# Patient Record
Sex: Female | Born: 1960 | Race: Black or African American | Hispanic: No | Marital: Single | Smoking: Current every day smoker
Health system: Southern US, Community
[De-identification: ages and names within clinical notes are randomized; demographics above are authoritative.]

## PROBLEM LIST (undated history)

## (undated) DIAGNOSIS — K859 Acute pancreatitis without necrosis or infection, unspecified: Secondary | ICD-10-CM

## (undated) DIAGNOSIS — K920 Hematemesis: Secondary | ICD-10-CM

## (undated) DIAGNOSIS — N920 Excessive and frequent menstruation with regular cycle: Secondary | ICD-10-CM

## (undated) DIAGNOSIS — D518 Other vitamin B12 deficiency anemias: Secondary | ICD-10-CM

## (undated) DIAGNOSIS — G40309 Generalized idiopathic epilepsy and epileptic syndromes, not intractable, without status epilepticus: Secondary | ICD-10-CM

## (undated) DIAGNOSIS — R946 Abnormal results of thyroid function studies: Secondary | ICD-10-CM

## (undated) DIAGNOSIS — N959 Unspecified menopausal and perimenopausal disorder: Secondary | ICD-10-CM

## (undated) DIAGNOSIS — F17200 Nicotine dependence, unspecified, uncomplicated: Secondary | ICD-10-CM

## (undated) DIAGNOSIS — R55 Syncope and collapse: Secondary | ICD-10-CM

## (undated) DIAGNOSIS — M79609 Pain in unspecified limb: Secondary | ICD-10-CM

## (undated) DIAGNOSIS — R002 Palpitations: Secondary | ICD-10-CM

## (undated) DIAGNOSIS — R634 Abnormal weight loss: Secondary | ICD-10-CM

## (undated) DIAGNOSIS — I10 Essential (primary) hypertension: Secondary | ICD-10-CM

## (undated) DIAGNOSIS — R21 Rash and other nonspecific skin eruption: Secondary | ICD-10-CM

## (undated) HISTORY — DX: Abnormal results of thyroid function studies: R94.6

## (undated) HISTORY — DX: Abnormal weight loss: R63.4

## (undated) HISTORY — DX: Excessive and frequent menstruation with regular cycle: N92.0

## (undated) HISTORY — DX: Other vitamin B12 deficiency anemias: D51.8

## (undated) HISTORY — DX: Palpitations: R00.2

## (undated) HISTORY — DX: Generalized idiopathic epilepsy and epileptic syndromes, not intractable, without status epilepticus: G40.309

## (undated) HISTORY — DX: Rash and other nonspecific skin eruption: R21

## (undated) HISTORY — DX: Syncope and collapse: R55

## (undated) HISTORY — DX: Nicotine dependence, unspecified, uncomplicated: F17.200

## (undated) HISTORY — DX: Essential (primary) hypertension: I10

## (undated) HISTORY — DX: Acute pancreatitis without necrosis or infection, unspecified: K85.90

## (undated) HISTORY — DX: Pain in unspecified limb: M79.609

## (undated) HISTORY — DX: Unspecified menopausal and perimenopausal disorder: N95.9

---

## 1996-12-01 HISTORY — PX: KNEE SURGERY: SHX244

## 2002-12-01 LAB — CONVERTED CEMR LAB: Pap Smear: NORMAL

## 2004-04-26 ENCOUNTER — Encounter: Admission: RE | Admit: 2004-04-26 | Discharge: 2004-04-26 | Payer: Self-pay | Admitting: *Deleted

## 2004-05-23 ENCOUNTER — Ambulatory Visit (HOSPITAL_COMMUNITY): Admission: RE | Admit: 2004-05-23 | Discharge: 2004-05-23 | Payer: Self-pay | Admitting: Family Medicine

## 2004-06-12 ENCOUNTER — Other Ambulatory Visit: Admission: RE | Admit: 2004-06-12 | Discharge: 2004-06-12 | Payer: Self-pay | Admitting: Family Medicine

## 2004-06-12 ENCOUNTER — Encounter: Payer: Self-pay | Admitting: Internal Medicine

## 2004-07-25 ENCOUNTER — Encounter: Payer: Self-pay | Admitting: Internal Medicine

## 2004-07-31 ENCOUNTER — Encounter: Payer: Self-pay | Admitting: Internal Medicine

## 2004-10-29 ENCOUNTER — Ambulatory Visit: Payer: Self-pay | Admitting: Internal Medicine

## 2004-12-31 ENCOUNTER — Ambulatory Visit (HOSPITAL_COMMUNITY): Admission: RE | Admit: 2004-12-31 | Discharge: 2004-12-31 | Payer: Self-pay | Admitting: Internal Medicine

## 2005-01-28 ENCOUNTER — Ambulatory Visit: Payer: Self-pay | Admitting: Internal Medicine

## 2005-02-20 ENCOUNTER — Ambulatory Visit: Payer: Self-pay | Admitting: Internal Medicine

## 2005-03-25 ENCOUNTER — Ambulatory Visit: Payer: Self-pay | Admitting: Internal Medicine

## 2005-03-27 ENCOUNTER — Encounter: Admission: RE | Admit: 2005-03-27 | Discharge: 2005-03-27 | Payer: Self-pay | Admitting: Internal Medicine

## 2006-01-12 ENCOUNTER — Ambulatory Visit: Payer: Self-pay | Admitting: Internal Medicine

## 2006-03-13 ENCOUNTER — Ambulatory Visit: Payer: Self-pay | Admitting: Internal Medicine

## 2007-05-28 ENCOUNTER — Ambulatory Visit: Payer: Self-pay | Admitting: Internal Medicine

## 2007-07-22 ENCOUNTER — Ambulatory Visit: Payer: Self-pay | Admitting: Internal Medicine

## 2007-07-23 ENCOUNTER — Encounter: Payer: Self-pay | Admitting: Internal Medicine

## 2007-08-18 ENCOUNTER — Telehealth: Payer: Self-pay | Admitting: Internal Medicine

## 2008-01-25 ENCOUNTER — Ambulatory Visit: Payer: Self-pay | Admitting: Internal Medicine

## 2008-01-25 DIAGNOSIS — F17200 Nicotine dependence, unspecified, uncomplicated: Secondary | ICD-10-CM

## 2008-01-25 HISTORY — DX: Nicotine dependence, unspecified, uncomplicated: F17.200

## 2008-01-29 DIAGNOSIS — I10 Essential (primary) hypertension: Secondary | ICD-10-CM

## 2008-01-29 HISTORY — DX: Essential (primary) hypertension: I10

## 2008-02-12 ENCOUNTER — Inpatient Hospital Stay (HOSPITAL_COMMUNITY): Admission: EM | Admit: 2008-02-12 | Discharge: 2008-02-13 | Payer: Self-pay | Admitting: Emergency Medicine

## 2008-02-12 ENCOUNTER — Ambulatory Visit: Payer: Self-pay | Admitting: Internal Medicine

## 2008-02-14 ENCOUNTER — Ambulatory Visit: Payer: Self-pay | Admitting: Internal Medicine

## 2008-02-14 DIAGNOSIS — G40309 Generalized idiopathic epilepsy and epileptic syndromes, not intractable, without status epilepticus: Secondary | ICD-10-CM | POA: Insufficient documentation

## 2008-02-14 DIAGNOSIS — D649 Anemia, unspecified: Secondary | ICD-10-CM

## 2008-02-14 HISTORY — DX: Generalized idiopathic epilepsy and epileptic syndromes, not intractable, without status epilepticus: G40.309

## 2008-02-14 LAB — CONVERTED CEMR LAB
Blood in Urine, dipstick: NEGATIVE
Glucose, Urine, Semiquant: NEGATIVE
Nitrite: NEGATIVE
Protein, U semiquant: NEGATIVE
Specific Gravity, Urine: 1.015
Urobilinogen, UA: 0.2
pH: 5.5

## 2008-02-15 ENCOUNTER — Encounter: Payer: Self-pay | Admitting: Internal Medicine

## 2008-02-15 ENCOUNTER — Ambulatory Visit (HOSPITAL_COMMUNITY): Admission: RE | Admit: 2008-02-15 | Discharge: 2008-02-15 | Payer: Self-pay | Admitting: Internal Medicine

## 2008-02-17 ENCOUNTER — Telehealth: Payer: Self-pay | Admitting: Internal Medicine

## 2008-02-18 LAB — CONVERTED CEMR LAB
Basophils Absolute: 0 10*3/uL (ref 0.0–0.1)
Basophils Relative: 0.2 % (ref 0.0–1.0)
CO2: 26 meq/L (ref 19–32)
Creatinine, Ser: 0.6 mg/dL (ref 0.4–1.2)
Folate: 9.1 ng/mL
GFR calc Af Amer: 140 mL/min
GFR calc non Af Amer: 115 mL/min
Glucose, Bld: 100 mg/dL — ABNORMAL HIGH (ref 70–99)
Hemoglobin: 11.2 g/dL — ABNORMAL LOW (ref 12.0–15.0)
MCV: 119.8 fL — ABNORMAL HIGH (ref 78.0–100.0)
Neutro Abs: 4.3 10*3/uL (ref 1.4–7.7)
Neutrophils Relative %: 72.4 % (ref 43.0–77.0)
RBC: 2.78 M/uL — ABNORMAL LOW (ref 3.87–5.11)
RDW: 16.9 % — ABNORMAL HIGH (ref 11.5–14.6)
Vitamin B-12: 241 pg/mL (ref 211–911)
WBC: 6 10*3/uL (ref 4.5–10.5)

## 2008-03-03 ENCOUNTER — Ambulatory Visit: Payer: Self-pay | Admitting: Internal Medicine

## 2008-03-08 ENCOUNTER — Encounter: Payer: Self-pay | Admitting: Internal Medicine

## 2008-03-16 ENCOUNTER — Telehealth: Payer: Self-pay | Admitting: *Deleted

## 2008-03-24 ENCOUNTER — Encounter: Payer: Self-pay | Admitting: Internal Medicine

## 2008-04-05 ENCOUNTER — Ambulatory Visit (HOSPITAL_COMMUNITY): Admission: RE | Admit: 2008-04-05 | Discharge: 2008-04-05 | Payer: Self-pay | Admitting: Neurology

## 2008-06-06 ENCOUNTER — Encounter: Payer: Self-pay | Admitting: Internal Medicine

## 2008-06-22 ENCOUNTER — Ambulatory Visit: Payer: Self-pay | Admitting: Internal Medicine

## 2008-06-22 DIAGNOSIS — M79609 Pain in unspecified limb: Secondary | ICD-10-CM

## 2008-06-22 HISTORY — DX: Pain in unspecified limb: M79.609

## 2008-07-03 ENCOUNTER — Telehealth: Payer: Self-pay | Admitting: Internal Medicine

## 2008-07-19 ENCOUNTER — Encounter: Payer: Self-pay | Admitting: Internal Medicine

## 2008-08-22 ENCOUNTER — Ambulatory Visit: Payer: Self-pay | Admitting: Internal Medicine

## 2008-08-22 ENCOUNTER — Telehealth: Payer: Self-pay | Admitting: Internal Medicine

## 2008-11-20 ENCOUNTER — Ambulatory Visit: Payer: Self-pay | Admitting: Internal Medicine

## 2008-11-20 ENCOUNTER — Encounter (INDEPENDENT_AMBULATORY_CARE_PROVIDER_SITE_OTHER): Payer: Self-pay | Admitting: *Deleted

## 2009-01-02 ENCOUNTER — Ambulatory Visit: Payer: Self-pay | Admitting: Internal Medicine

## 2009-01-02 DIAGNOSIS — R002 Palpitations: Secondary | ICD-10-CM

## 2009-01-02 DIAGNOSIS — R55 Syncope and collapse: Secondary | ICD-10-CM

## 2009-01-02 HISTORY — DX: Palpitations: R00.2

## 2009-01-02 HISTORY — DX: Syncope and collapse: R55

## 2009-01-11 ENCOUNTER — Ambulatory Visit: Payer: Self-pay

## 2009-01-11 ENCOUNTER — Encounter: Payer: Self-pay | Admitting: Internal Medicine

## 2009-01-11 ENCOUNTER — Ambulatory Visit: Payer: Self-pay | Admitting: Internal Medicine

## 2009-01-29 ENCOUNTER — Telehealth: Payer: Self-pay | Admitting: Internal Medicine

## 2009-02-07 ENCOUNTER — Ambulatory Visit: Payer: Self-pay | Admitting: Internal Medicine

## 2009-02-12 ENCOUNTER — Ambulatory Visit: Payer: Self-pay | Admitting: Internal Medicine

## 2009-02-12 DIAGNOSIS — J019 Acute sinusitis, unspecified: Secondary | ICD-10-CM

## 2009-02-13 ENCOUNTER — Telehealth: Payer: Self-pay | Admitting: Internal Medicine

## 2009-02-14 ENCOUNTER — Telehealth: Payer: Self-pay | Admitting: Internal Medicine

## 2009-03-14 ENCOUNTER — Ambulatory Visit: Payer: Self-pay | Admitting: Internal Medicine

## 2009-03-14 LAB — CONVERTED CEMR LAB
ALT: 16 units/L (ref 0–35)
Albumin: 3.7 g/dL (ref 3.5–5.2)
Bilirubin Urine: NEGATIVE
Bilirubin, Direct: 0.1 mg/dL (ref 0.0–0.3)
CO2: 30 meq/L (ref 19–32)
Calcium: 8.9 mg/dL (ref 8.4–10.5)
Chloride: 107 meq/L (ref 96–112)
Cholesterol: 154 mg/dL (ref 0–200)
Creatinine, Ser: 0.6 mg/dL (ref 0.4–1.2)
GFR calc non Af Amer: 138.52 mL/min (ref >60)
Glucose, Bld: 73 mg/dL (ref 70–99)
HCT: 28.2 % — ABNORMAL LOW (ref 36.0–46.0)
HDL: 117.8 mg/dL (ref >39.00)
Hemoglobin: 9.6 g/dL — ABNORMAL LOW (ref 12.0–15.0)
Ketones, urine, test strip: NEGATIVE
LDL Cholesterol: 10 mg/dL (ref 0–99)
Lymphocytes Relative: 28.5 % (ref 12.0–46.0)
Lymphs Abs: 1.3 10*3/uL (ref 0.7–4.0)
Potassium: 3.8 meq/L (ref 3.5–5.1)
RDW: 18.3 % — ABNORMAL HIGH (ref 11.5–14.6)
TSH: 0.33 microintl units/mL — ABNORMAL LOW (ref 0.35–5.50)
Total Bilirubin: 0.6 mg/dL (ref 0.3–1.2)
Total Protein: 6.8 g/dL (ref 6.0–8.3)
WBC Urine, dipstick: NEGATIVE
WBC: 4.6 10*3/uL (ref 4.5–10.5)

## 2009-03-19 ENCOUNTER — Encounter: Payer: Self-pay | Admitting: Internal Medicine

## 2009-03-19 ENCOUNTER — Ambulatory Visit: Payer: Self-pay | Admitting: Internal Medicine

## 2009-03-19 ENCOUNTER — Other Ambulatory Visit: Admission: RE | Admit: 2009-03-19 | Discharge: 2009-03-19 | Payer: Self-pay | Admitting: Internal Medicine

## 2009-03-19 DIAGNOSIS — R946 Abnormal results of thyroid function studies: Secondary | ICD-10-CM

## 2009-03-19 HISTORY — DX: Abnormal results of thyroid function studies: R94.6

## 2009-03-19 LAB — CONVERTED CEMR LAB: Retic Ct Pct: 2.7 % (ref 0.4–3.1)

## 2009-03-27 ENCOUNTER — Telehealth: Payer: Self-pay | Admitting: Internal Medicine

## 2009-03-27 LAB — CONVERTED CEMR LAB
Eosinophils Absolute: 0 10*3/uL (ref 0.0–0.7)
Ferritin: 306.1 ng/mL — ABNORMAL HIGH (ref 10.0–291.0)
Free T4: 0.7 ng/dL (ref 0.6–1.6)
Hemoglobin: 10.1 g/dL — ABNORMAL LOW (ref 12.0–15.0)
Iron: 230 ug/dL — ABNORMAL HIGH (ref 42–145)
Lymphs Abs: 1.4 10*3/uL (ref 0.7–4.0)
MCHC: 34.6 g/dL (ref 30.0–36.0)
Monocytes Absolute: 0.3 10*3/uL (ref 0.1–1.0)
Neutro Abs: 3 10*3/uL (ref 1.4–7.7)
Platelets: 204 10*3/uL (ref 150.0–400.0)
RBC: 2.57 M/uL — ABNORMAL LOW (ref 3.87–5.11)
RDW: 17.7 % — ABNORMAL HIGH (ref 11.5–14.6)
Transferrin: 314.4 mg/dL (ref 212.0–360.0)
Vitamin B-12: 146 pg/mL — ABNORMAL LOW (ref 211–911)
WBC: 4.7 10*3/uL (ref 4.5–10.5)

## 2009-04-05 ENCOUNTER — Ambulatory Visit: Payer: Self-pay | Admitting: Internal Medicine

## 2009-04-05 DIAGNOSIS — D518 Other vitamin B12 deficiency anemias: Secondary | ICD-10-CM | POA: Insufficient documentation

## 2009-04-05 HISTORY — DX: Other vitamin B12 deficiency anemias: D51.8

## 2009-04-12 ENCOUNTER — Ambulatory Visit: Payer: Self-pay | Admitting: Internal Medicine

## 2009-04-19 ENCOUNTER — Ambulatory Visit: Payer: Self-pay | Admitting: Internal Medicine

## 2009-04-26 ENCOUNTER — Ambulatory Visit: Payer: Self-pay | Admitting: Internal Medicine

## 2009-05-03 ENCOUNTER — Ambulatory Visit: Payer: Self-pay | Admitting: Internal Medicine

## 2009-05-10 ENCOUNTER — Ambulatory Visit: Payer: Self-pay | Admitting: Internal Medicine

## 2009-05-17 ENCOUNTER — Ambulatory Visit: Payer: Self-pay | Admitting: Internal Medicine

## 2009-05-25 LAB — CONVERTED CEMR LAB
Basophils Absolute: 0 10*3/uL (ref 0.0–0.1)
Eosinophils Absolute: 0 10*3/uL (ref 0.0–0.7)
Eosinophils Relative: 0.8 % (ref 0.0–5.0)
Folate: 3.7 ng/mL
Lymphs Abs: 1.3 10*3/uL (ref 0.7–4.0)
MCHC: 34.3 g/dL (ref 30.0–36.0)
Monocytes Absolute: 0.3 10*3/uL (ref 0.1–1.0)
Neutro Abs: 3.5 10*3/uL (ref 1.4–7.7)
Neutrophils Relative %: 67.6 % (ref 43.0–77.0)
RDW: 16.2 % — ABNORMAL HIGH (ref 11.5–14.6)

## 2009-05-29 ENCOUNTER — Ambulatory Visit: Payer: Self-pay | Admitting: Internal Medicine

## 2009-06-28 ENCOUNTER — Ambulatory Visit: Payer: Self-pay | Admitting: Internal Medicine

## 2009-07-30 ENCOUNTER — Ambulatory Visit: Payer: Self-pay | Admitting: Internal Medicine

## 2009-08-16 ENCOUNTER — Ambulatory Visit: Payer: Self-pay | Admitting: Internal Medicine

## 2009-08-16 LAB — CONVERTED CEMR LAB
Basophils Absolute: 0 10*3/uL (ref 0.0–0.1)
Eosinophils Absolute: 0 10*3/uL (ref 0.0–0.7)
Hemoglobin: 11 g/dL — ABNORMAL LOW (ref 12.0–15.0)
Lymphocytes Relative: 35.8 % (ref 12.0–46.0)
MCV: 116.6 fL — ABNORMAL HIGH (ref 78.0–100.0)
Monocytes Absolute: 0.4 10*3/uL (ref 0.1–1.0)
Platelets: 145 10*3/uL — ABNORMAL LOW (ref 150.0–400.0)
Retic Ct Pct: 2 % (ref 0.4–3.1)
WBC: 3.8 10*3/uL — ABNORMAL LOW (ref 4.5–10.5)

## 2009-08-23 ENCOUNTER — Ambulatory Visit: Payer: Self-pay | Admitting: Internal Medicine

## 2009-08-23 DIAGNOSIS — D539 Nutritional anemia, unspecified: Secondary | ICD-10-CM

## 2009-08-23 DIAGNOSIS — N959 Unspecified menopausal and perimenopausal disorder: Secondary | ICD-10-CM | POA: Insufficient documentation

## 2009-08-23 HISTORY — DX: Unspecified menopausal and perimenopausal disorder: N95.9

## 2009-08-28 ENCOUNTER — Ambulatory Visit: Payer: Self-pay | Admitting: Internal Medicine

## 2009-09-19 ENCOUNTER — Encounter: Payer: Self-pay | Admitting: Internal Medicine

## 2009-09-19 ENCOUNTER — Encounter: Payer: Self-pay | Admitting: *Deleted

## 2009-09-19 LAB — CBC & DIFF AND RETIC
BASO%: 0.5 % (ref 0.0–2.0)
EOS%: 1.5 % (ref 0.0–7.0)
Immature Retic Fract: 17.6 % — ABNORMAL HIGH (ref 0.00–10.70)
MCH: 37.5 pg — ABNORMAL HIGH (ref 25.1–34.0)
MCHC: 34.3 g/dL (ref 31.5–36.0)
MONO#: 0.3 10*3/uL (ref 0.1–0.9)
RBC: 3.09 10*6/uL — ABNORMAL LOW (ref 3.70–5.45)
Retic %: 1.84 % — ABNORMAL HIGH (ref 0.50–1.50)
WBC: 4.1 10*3/uL (ref 3.9–10.3)
lymph#: 1.3 10*3/uL (ref 0.9–3.3)

## 2009-09-19 LAB — CONVERTED CEMR LAB: Potassium: 3 meq/L

## 2009-09-21 LAB — LACTATE DEHYDROGENASE: LDH: 167 U/L (ref 94–250)

## 2009-09-21 LAB — VITAMIN B12: Vitamin B-12: 708 pg/mL (ref 211–911)

## 2009-09-21 LAB — COMPREHENSIVE METABOLIC PANEL
ALT: 11 U/L (ref 0–35)
Alkaline Phosphatase: 71 U/L (ref 39–117)
CO2: 27 mEq/L (ref 19–32)
Creatinine, Ser: 0.65 mg/dL (ref 0.40–1.20)
Glucose, Bld: 92 mg/dL (ref 70–99)
Sodium: 141 mEq/L (ref 135–145)
Total Bilirubin: 0.4 mg/dL (ref 0.3–1.2)
Total Protein: 7.6 g/dL (ref 6.0–8.3)

## 2009-09-21 LAB — PROTEIN ELECTROPHORESIS, SERUM
Alpha-1-Globulin: 4.9 % (ref 2.9–4.9)
Alpha-2-Globulin: 12.3 % — ABNORMAL HIGH (ref 7.1–11.8)
Beta 2: 6 % (ref 3.2–6.5)
Gamma Globulin: 14.6 % (ref 11.1–18.8)

## 2009-09-21 LAB — FERRITIN: Ferritin: 523 ng/mL — ABNORMAL HIGH (ref 10–291)

## 2009-09-21 LAB — IRON AND TIBC
%SAT: 35 % (ref 20–55)
Iron: 130 ug/dL (ref 42–145)
TIBC: 375 ug/dL (ref 250–470)

## 2009-10-04 ENCOUNTER — Telehealth: Payer: Self-pay | Admitting: *Deleted

## 2009-10-04 ENCOUNTER — Encounter: Payer: Self-pay | Admitting: *Deleted

## 2009-10-11 ENCOUNTER — Ambulatory Visit: Payer: Self-pay | Admitting: Internal Medicine

## 2009-10-15 ENCOUNTER — Encounter: Payer: Self-pay | Admitting: Internal Medicine

## 2009-10-15 LAB — CBC WITH DIFFERENTIAL/PLATELET
BASO%: 0.9 % (ref 0.0–2.0)
EOS%: 1 % (ref 0.0–7.0)
LYMPH%: 29.5 % (ref 14.0–49.7)
MCH: 38.8 pg — ABNORMAL HIGH (ref 25.1–34.0)
MCHC: 33.1 g/dL (ref 31.5–36.0)
MCV: 117.2 fL — ABNORMAL HIGH (ref 79.5–101.0)
MONO#: 0.4 10*3/uL (ref 0.1–0.9)
MONO%: 7.6 % (ref 0.0–14.0)
NEUT%: 61 % (ref 38.4–76.8)
Platelets: 199 10*3/uL (ref 145–400)
RBC: 2.86 10*6/uL — ABNORMAL LOW (ref 3.70–5.45)
WBC: 4.7 10*3/uL (ref 3.9–10.3)

## 2010-03-18 ENCOUNTER — Ambulatory Visit: Payer: Self-pay | Admitting: Internal Medicine

## 2010-03-18 DIAGNOSIS — N920 Excessive and frequent menstruation with regular cycle: Secondary | ICD-10-CM

## 2010-03-18 HISTORY — DX: Excessive and frequent menstruation with regular cycle: N92.0

## 2010-03-27 LAB — CONVERTED CEMR LAB
BUN: 5 mg/dL — ABNORMAL LOW (ref 6–23)
Basophils Absolute: 0 10*3/uL (ref 0.0–0.1)
CO2: 29 meq/L (ref 19–32)
Chloride: 99 meq/L (ref 96–112)
Creatinine, Ser: 0.7 mg/dL (ref 0.4–1.2)
Eosinophils Relative: 0.6 % (ref 0.0–5.0)
GFR calc non Af Amer: 115.43 mL/min (ref >60)
HCT: 31.1 % — ABNORMAL LOW (ref 36.0–46.0)
Hemoglobin: 10.7 g/dL — ABNORMAL LOW (ref 12.0–15.0)
Lymphs Abs: 1 10*3/uL (ref 0.7–4.0)
Neutro Abs: 3.6 10*3/uL (ref 1.4–7.7)
Potassium: 3.6 meq/L (ref 3.5–5.1)

## 2010-04-17 ENCOUNTER — Other Ambulatory Visit: Admission: RE | Admit: 2010-04-17 | Discharge: 2010-04-17 | Payer: Self-pay | Admitting: Obstetrics and Gynecology

## 2010-04-17 ENCOUNTER — Ambulatory Visit: Payer: Self-pay | Admitting: Obstetrics and Gynecology

## 2010-04-24 ENCOUNTER — Ambulatory Visit (HOSPITAL_COMMUNITY)
Admission: RE | Admit: 2010-04-24 | Discharge: 2010-04-24 | Payer: Self-pay | Source: Home / Self Care | Admitting: Obstetrics and Gynecology

## 2010-05-03 ENCOUNTER — Ambulatory Visit: Payer: Self-pay | Admitting: Obstetrics & Gynecology

## 2010-05-03 ENCOUNTER — Encounter (INDEPENDENT_AMBULATORY_CARE_PROVIDER_SITE_OTHER): Payer: Self-pay | Admitting: Family Medicine

## 2010-05-03 LAB — CONVERTED CEMR LAB: FSH: 54.3 milliintl units/mL

## 2010-07-05 ENCOUNTER — Ambulatory Visit: Payer: Self-pay | Admitting: Internal Medicine

## 2010-08-06 ENCOUNTER — Ambulatory Visit: Payer: Self-pay | Admitting: Internal Medicine

## 2010-09-23 ENCOUNTER — Ambulatory Visit: Payer: Self-pay | Admitting: Internal Medicine

## 2010-09-23 DIAGNOSIS — R61 Generalized hyperhidrosis: Secondary | ICD-10-CM | POA: Insufficient documentation

## 2010-12-03 ENCOUNTER — Ambulatory Visit (HOSPITAL_COMMUNITY)
Admission: RE | Admit: 2010-12-03 | Discharge: 2010-12-03 | Payer: Self-pay | Source: Home / Self Care | Attending: Internal Medicine | Admitting: Internal Medicine

## 2010-12-03 ENCOUNTER — Ambulatory Visit
Admission: RE | Admit: 2010-12-03 | Discharge: 2010-12-03 | Payer: Self-pay | Source: Home / Self Care | Attending: Internal Medicine | Admitting: Internal Medicine

## 2010-12-03 ENCOUNTER — Other Ambulatory Visit: Payer: Self-pay | Admitting: Internal Medicine

## 2010-12-03 DIAGNOSIS — R109 Unspecified abdominal pain: Secondary | ICD-10-CM | POA: Insufficient documentation

## 2010-12-03 LAB — BASIC METABOLIC PANEL
BUN: 10 mg/dL (ref 6–23)
CO2: 25 mEq/L (ref 19–32)
Calcium: 10.3 mg/dL (ref 8.4–10.5)
Chloride: 92 mEq/L — ABNORMAL LOW (ref 96–112)
Creatinine, Ser: 0.7 mg/dL (ref 0.4–1.2)
GFR: 118.99 mL/min (ref >60.00)
Glucose, Bld: 81 mg/dL (ref 70–99)
Potassium: 3.7 mEq/L (ref 3.5–5.1)
Sodium: 134 mEq/L — ABNORMAL LOW (ref 135–145)

## 2010-12-03 LAB — CBC WITH DIFFERENTIAL/PLATELET
Basophils Absolute: 0 10*3/uL (ref 0.0–0.1)
Basophils Relative: 0.3 % (ref 0.0–3.0)
Eosinophils Absolute: 0.1 10*3/uL (ref 0.0–0.7)
Eosinophils Relative: 0.8 % (ref 0.0–5.0)
HCT: 39.9 % (ref 36.0–46.0)
Hemoglobin: 13.3 g/dL (ref 12.0–15.0)
Lymphocytes Relative: 13.8 % (ref 12.0–46.0)
Lymphs Abs: 0.9 10*3/uL (ref 0.7–4.0)
MCHC: 33.4 g/dL (ref 30.0–36.0)
MCV: 112.3 fl — ABNORMAL HIGH (ref 78.0–100.0)
Monocytes Absolute: 0.5 10*3/uL (ref 0.1–1.0)
Monocytes Relative: 8.1 % (ref 3.0–12.0)
Neutro Abs: 4.9 10*3/uL (ref 1.4–7.7)
Neutrophils Relative %: 77 % (ref 43.0–77.0)
Platelets: 279 10*3/uL (ref 150.0–400.0)
RBC: 3.56 Mil/uL — ABNORMAL LOW (ref 3.87–5.11)
RDW: 16.8 % — ABNORMAL HIGH (ref 11.5–14.6)
WBC: 6.3 10*3/uL (ref 4.5–10.5)

## 2010-12-03 LAB — HEPATIC FUNCTION PANEL
ALT: 12 U/L (ref 0–35)
AST: 31 U/L (ref 0–37)
Albumin: 3.8 g/dL (ref 3.5–5.2)
Alkaline Phosphatase: 110 U/L (ref 39–117)
Bilirubin, Direct: 0.1 mg/dL (ref 0.0–0.3)
Total Bilirubin: 1.2 mg/dL (ref 0.3–1.2)
Total Protein: 8.2 g/dL (ref 6.0–8.3)

## 2010-12-03 LAB — CONVERTED CEMR LAB
Nitrite: NEGATIVE
Urobilinogen, UA: 1

## 2010-12-03 LAB — LIPASE: Lipase: 1227 U/L — ABNORMAL HIGH (ref 11.0–59.0)

## 2010-12-03 LAB — AMYLASE: Amylase: 452 U/L — ABNORMAL HIGH (ref 27–131)

## 2010-12-04 ENCOUNTER — Telehealth: Payer: Self-pay | Admitting: Internal Medicine

## 2010-12-04 ENCOUNTER — Telehealth (INDEPENDENT_AMBULATORY_CARE_PROVIDER_SITE_OTHER): Payer: Self-pay

## 2010-12-05 ENCOUNTER — Ambulatory Visit
Admission: RE | Admit: 2010-12-05 | Discharge: 2010-12-05 | Payer: Self-pay | Source: Home / Self Care | Attending: Gastroenterology | Admitting: Gastroenterology

## 2010-12-05 ENCOUNTER — Other Ambulatory Visit: Payer: Self-pay | Admitting: Physician Assistant

## 2010-12-05 DIAGNOSIS — R634 Abnormal weight loss: Secondary | ICD-10-CM | POA: Insufficient documentation

## 2010-12-05 DIAGNOSIS — K859 Acute pancreatitis without necrosis or infection, unspecified: Secondary | ICD-10-CM | POA: Insufficient documentation

## 2010-12-05 HISTORY — DX: Abnormal weight loss: R63.4

## 2010-12-05 LAB — CBC WITH DIFFERENTIAL/PLATELET
Basophils Absolute: 0 10*3/uL (ref 0.0–0.1)
Basophils Relative: 0.4 % (ref 0.0–3.0)
Eosinophils Absolute: 0.1 10*3/uL (ref 0.0–0.7)
Eosinophils Relative: 1.3 % (ref 0.0–5.0)
HCT: 38.9 % (ref 36.0–46.0)
Hemoglobin: 12.8 g/dL (ref 12.0–15.0)
Lymphocytes Relative: 14 % (ref 12.0–46.0)
Lymphs Abs: 0.9 10*3/uL (ref 0.7–4.0)
MCHC: 32.9 g/dL (ref 30.0–36.0)
MCV: 111.5 fl — ABNORMAL HIGH (ref 78.0–100.0)
Monocytes Absolute: 0.5 10*3/uL (ref 0.1–1.0)
Monocytes Relative: 8.3 % (ref 3.0–12.0)
Neutro Abs: 5 10*3/uL (ref 1.4–7.7)
Neutrophils Relative %: 76 % (ref 43.0–77.0)
Platelets: 294 10*3/uL (ref 150.0–400.0)
RBC: 3.49 Mil/uL — ABNORMAL LOW (ref 3.87–5.11)
RDW: 16.3 % — ABNORMAL HIGH (ref 11.5–14.6)
WBC: 6.6 10*3/uL (ref 4.5–10.5)

## 2010-12-05 LAB — BASIC METABOLIC PANEL
BUN: 16 mg/dL (ref 6–23)
CO2: 22 mEq/L (ref 19–32)
Calcium: 10.5 mg/dL (ref 8.4–10.5)
Chloride: 96 mEq/L (ref 96–112)
Creatinine, Ser: 0.8 mg/dL (ref 0.4–1.2)
GFR: 101.57 mL/min (ref >60.00)
Glucose, Bld: 83 mg/dL (ref 70–99)
Potassium: 4.4 mEq/L (ref 3.5–5.1)
Sodium: 137 mEq/L (ref 135–145)

## 2010-12-05 LAB — AMYLASE: Amylase: 309 U/L — ABNORMAL HIGH (ref 27–131)

## 2010-12-05 LAB — LIPASE: Lipase: 718 U/L — ABNORMAL HIGH (ref 11.0–59.0)

## 2010-12-16 ENCOUNTER — Ambulatory Visit
Admission: RE | Admit: 2010-12-16 | Discharge: 2010-12-16 | Payer: Self-pay | Source: Home / Self Care | Attending: Gastroenterology | Admitting: Gastroenterology

## 2010-12-16 ENCOUNTER — Encounter: Payer: Self-pay | Admitting: Gastroenterology

## 2010-12-16 DIAGNOSIS — R933 Abnormal findings on diagnostic imaging of other parts of digestive tract: Secondary | ICD-10-CM | POA: Insufficient documentation

## 2010-12-16 LAB — CONVERTED CEMR LAB: Beta hcg, urine, semiquantitative: NEGATIVE

## 2010-12-22 ENCOUNTER — Encounter: Payer: Self-pay | Admitting: Family Medicine

## 2010-12-23 ENCOUNTER — Other Ambulatory Visit: Payer: Self-pay | Admitting: Internal Medicine

## 2010-12-23 ENCOUNTER — Ambulatory Visit
Admission: RE | Admit: 2010-12-23 | Discharge: 2010-12-23 | Payer: Self-pay | Source: Home / Self Care | Attending: Internal Medicine | Admitting: Internal Medicine

## 2010-12-23 DIAGNOSIS — R21 Rash and other nonspecific skin eruption: Secondary | ICD-10-CM

## 2010-12-23 HISTORY — DX: Rash and other nonspecific skin eruption: R21

## 2010-12-25 ENCOUNTER — Other Ambulatory Visit: Payer: Self-pay | Admitting: Gastroenterology

## 2010-12-25 DIAGNOSIS — K859 Acute pancreatitis without necrosis or infection, unspecified: Secondary | ICD-10-CM

## 2010-12-31 NOTE — Assessment & Plan Note (Signed)
Summary: swollen face//ccm   Vital Signs:  Patient profile:   50 year old female Menstrual status:  perimenopausal Weight:      107 pounds Temp:     98.6 degrees F oral Pulse rate:   72 / minute BP sitting:   110 / 70  (left arm) Cuff size:   regular  Vitals Entered By: Romualdo Bolk, CMA (AAMA) (July 05, 2010 9:09 AM) CC: Face swelling this started on 8/4 with her mouth swollen and cracking. Pt states that the only thing she can't relate this too is eating at [employer] on 8/3. Pt is not having any problems breathing. On 8/3 pt stated having a frontal ha. Some coughing and sinus drainage. Pt states that this happened before and it was a tooth abcess but she doesn't have any problems with teeth hurting.   History of Present Illness: Nichole [name] comes in today  for SDA for above  ... yesterday some painless lip  swelling and now whole face swollen upt to  forehead ... no new meds and no fever dental pain . midl cough no choking or wheezing .   no hx of hives allergic rx like this.  NO uri signs but has small spot sore on tip of tongue.   No changes but did eat a pf changes  but no flushing or redness.  No edema peripherally.   Preventive Screening-Counseling & Management  Alcohol-Tobacco     Alcohol drinks/day: 2     Alcohol type: beers and mix drink on weekends     Smoking Status: current     Smoking Cessation Counseling: yes     Packs/Day: 0.5  Caffeine-Diet-Exercise     Caffeine use/day: 0     Does Patient Exercise: yes     Type of exercise: walking     Exercise (avg: min/session): <30     Times/week: 2  Current Medications (verified): 1)  Metoprolol Tartrate 50 Mg  Tabs (Metoprolol Tartrate) .... 1.5  By Mouth Qam and 1 At Night 2)  Lisinopril-Hydrochlorothiazide 20-25 Mg  Tabs (Lisinopril-Hydrochlorothiazide) .... 1/2 Two Times A Day  Allergies (verified): No Known Drug Allergies  Past History:  Past medical, surgical, family and social histories  (including risk factors) reviewed, and no changes noted (except as noted below).  Past Medical History: Reviewed history from 03/19/2009 and no changes required. Hypertension hosp for seizure ? etho wd 3/14 09   Tachypalpitations  nl echo and Holter PVCs and PACs  Syncope probably neurally mediated     Consults [facility]  Past Surgical History: Reviewed history from 01/25/2008 and no changes required. knee surg 2001  Past History:  Care Management: Neurology: [facility]: Dr. Graciela [facility]  Family History: Reviewed history from 05/29/2009 and no changes required. spinomuscular atrophy  no anemia  Father:  MI   CVA  ,  70 died  Mother:  Kidney removed  for cyst  and overweight.  HT   Siblings:   bro HT    Thyroid negative .  osteoprosis  Aunt  renal transplant.  ? no anemia   Social History: Reviewed history from 03/18/2010 and no changes required. Single Current Smoker no insurance    on [employer] program  stopped all alcohol since original episode more recently  now about 6-7 on the weekend  non during th week  unemployed   Review of Systems  The patient denies anorexia, fever, weight loss, weight gain, vision loss, decreased hearing, dyspnea on exertion,  prolonged cough, abdominal pain, melena, hematochezia, hematuria, difficulty walking, abnormal bleeding, and enlarged lymph nodes.    Physical Exam  General:  alert, well-developed, and well-nourished.   swelling  total face  nasal bridge down  and lips  no distress Head:  normocephalic and atraumatic.   Eyes:  vision grossly intact, pupils equal, and pupils round.   Ears:  R ear normal and L ear normal.  wax right ear  Nose:  no external deformity, no external erythema, and no nasal discharge.   Mouth:  pharynx pink and moist.  no edema and good airway.  tongue nl except tiny  are tip of tongue.  Neck:  No deformities, masses, or tenderness noted. Lungs:  Normal respiratory effort,  chest expands symmetrically. Lungs are clear to auscultation, no crackles or wheezes. Heart:  Normal rate and regular rhythm. S1 and S2 normal without gallop, murmur, click, rub or other extra sounds. Msk:  no acute changes  Pulses:  nl cap arefill  Extremities:  no clubbing cyanosis or edema  Neurologic:  alert & oriented X3 and gait normal.   Skin:  turgor normal and color normal.  edema as above  no redness excep faint red left upper lip Cervical Nodes:  No lymphadenopathy noted Psych:  Oriented X3, normally interactive, good eye contact, not anxious appearing, and not depressed appearing.     Impression & Recommendations:  Problem # 1:  ? of ANGIOEDEMA (ICD-995.1) Assessment New  no obv  dental infection  or sinusitis .    concern about   ace inhibitor as a cause  .     will rx as such .      no airway issues but does have a cough  .   counseled   to seek care if alarm features.  Orders: Prescription Created Electronically [phone])  Problem # 2:  HYPERTENSION (ICD-401.9)  The following medications were removed from the medication list:    Lisinopril-hydrochlorothiazide 20-25 Mg Tabs (Lisinopril-hydrochlorothiazide) .Marland Kitchen... 1/2 two times a day Her updated medication list for this problem includes:    Metoprolol Tartrate 50 Mg Tabs (Metoprolol tartrate) .Marland Kitchen... 1.5  by mouth qam and 1 at night    Benicar Hct 40-25 Mg Tabs (Olmesartan medoxomil-hctz) .Marland Kitchen... Take 1/2 by mouth once daily for high blood pressure  Problem # 3:  TOBACCO USE (ICD-305.1) Assessment: Comment Only  Complete Medication List: 1)  Metoprolol Tartrate 50 Mg Tabs (Metoprolol tartrate) .... 1.5  by mouth qam and 1 at night 2)  Prednisone 20 Mg Tabs (Prednisone) .... Take 3  by mouth once daily for 2 days then 2 by mouth once daily for 3 days  or as directed 3)  Benicar Hct 40-25 Mg Tabs (Olmesartan medoxomil-hctz) .... Take 1/2 by mouth once daily for high blood pressure  Patient Instructions: 1)  incase this is a  side effect of the lisinopril  . we need to stop this medicine. 2)  Take prednisone .  as directed . 3)  for possible allergic reaction.  4)  should improve in the next 24-72  hours .  5)  can also take generic benadry l in the meantime 6)  if getting worse  fever  or respiratory problems call  7)  when  better can start the sample of bp med   and then ROV  in 1 month.  Prescriptions: PREDNISONE 20 MG TABS (PREDNISONE) take 3  by mouth once daily for 2 days then 2 by mouth once daily for  3 days  or as directed  #15 x 0   Entered and Authorized by:   [name]   Signed by:   [name] on 07/05/2010   Method used:   Electronically to        [facility] [phone]* (retail)       [address]       [place], [place]  [address]       Ph: [phone]       Fax: [phone]   RxID:   [id number]

## 2010-12-31 NOTE — Assessment & Plan Note (Signed)
Summary: 1 month fup//ccm   Vital Signs:  Patient profile:   50 year old female Menstrual status:  perimenopausal Weight:      109 pounds Pulse rate:   66 / minute BP sitting:   140 / 88  Serial Vital Signs/Assessments:  Time      Position  BP       Pulse  Resp  Temp     By           R Arm     140/90                         Nichole [name]           L Arm     145/90                         Nichole [name]   History of Present Illness: Nichole [name] comes in today  for follow up of High blood pressure and facial swelling .     ace inhibitor was stopped and was given benicarhctz samples and pred  .   Had gotten better immediatley Since that time  ..she has felt well.   She stopped  the med a few weeks ago .Marland Kitchen She feels better  and no recurrence of swelling that resolved within a day. NO fevers. [facility]  from [place] BP was 155/80 and rx was delayed pending clearance form [name].     Hasnt been checking readings.  N o palpitations   missswed last pms dose of metoprolol   but took todays.   Preventive Screening-Counseling & Management  Alcohol-Tobacco     Alcohol drinks/day: 2     Alcohol type: beers and mix drink on weekends     Smoking Status: current     Smoking Cessation Counseling: yes     Packs/Day: 0.5  Caffeine-Diet-Exercise     Caffeine use/day: 0     Does Patient Exercise: yes     Type of exercise: walking     Exercise (avg: min/session): <30     Times/week: 2  Allergies (verified): 1)  ! * Lisinopril 2)  * Benicar Hctz  Past History:  Past medical, surgical, family and social histories (including risk factors) reviewed for relevance to current acute and chronic problems.  Past Medical History: Hypertension hosp for seizure ? etho wd 3/14 09   Tachypalpitations  nl echo and Holter PVCs and PACs  Syncope probably neurally mediated DUB with submucosal fibroid June 2011    Consults [facility]  Past Surgical  History: Reviewed history from 01/25/2008 and no changes required. knee surg 2001  Past History:  Care Management: Neurology: [facility]: Dr. Graciela [facility]   Family History: Reviewed history from 05/29/2009 and no changes required. spinomuscular atrophy  no anemia  Father:  MI   CVA  ,  9 died  Mother:  Kidney removed  for cyst  and overweight.  HT   Siblings:   bro HT    Thyroid negative .  osteoprosis  Aunt  renal transplant.  ? no anemia   Social History: Reviewed history from 03/18/2010 and no changes required. Single Current Smoker no insurance    on [employer] program  stopped all alcohol since original episode more recently  now about 6-7 on the weekend  non during th week  unemployed   Review  of Systems  The patient denies chest pain, syncope, dyspnea on exertion, prolonged cough, and peripheral edema.    Physical Exam  General:  Well-developed,well-nourished,in no acute distress; alert,appropriate and cooperative throughout examination Head:  normocephalic and atraumatic.   Eyes:  vision grossly intact.   Neck:  No deformities, masses, or tenderness noted. Lungs:  normal respiratory effort, no intercostal retractions, and no accessory muscle use.   Heart:  normal rate and regular rhythm.   Cervical Nodes:  No lymphadenopathy noted Psych:  Oriented X3, normally interactive, good eye contact, not anxious appearing, and not depressed appearing.     Impression & Recommendations:  Problem # 1:  HYPERTENSION (ICD-401.9) my readings are generally up  but nurses are not.   sometimes takes med late .   certainly not to take ace with hx of poss angioedema and seems to get se of diuretics .... cost a factor will try  low dose norvasc .Marland Kitchen.  se explained . The following medications were removed from the medication list:    Benicar Hct 40-25 Mg Tabs (Olmesartan medoxomil-hctz) .Marland Kitchen... Take 1/2 by mouth once daily for high blood pressure Her updated  medication list for this problem includes:    Metoprolol Tartrate 50 Mg Tabs (Metoprolol tartrate) .Marland Kitchen... 1.5  by mouth qam and 1 at night    Amlodipine Besylate 5 Mg Tabs (Amlodipine besylate) .Marland Kitchen... Take 1/2 by mouth once daily andincrease to 1 by mouth once daily or as directed for high blood pressure form signed for dental   cleaning  go ahead   Complete Medication List: 1)  Metoprolol Tartrate 50 Mg Tabs (Metoprolol tartrate) .... 1.5  by mouth qam and 1 at night 2)  Amlodipine Besylate 5 Mg Tabs (Amlodipine besylate) .... Take 1/2 by mouth once daily andincrease to 1 by mouth once daily or as directed for high blood pressure  Patient Instructions: 1)  begin 2.5 mg of amlodipine ( 1/2 of the 5 mg)  2)  return office visit in 6-8 weeks . 3)  if BP over 150 call .  4)  goal is below 140/90 on average . Prescriptions: AMLODIPINE BESYLATE 5 MG TABS (AMLODIPINE BESYLATE) take 1/2 by mouth once daily andincrease to 1 by mouth once daily or as directed for high blood pressure  #30 x 2   Entered and Authorized by:   Nichole [name]   Signed by:   Nichole [name] on 08/06/2010   Method used:   Electronically to        [facility] [phone]* (retail)       [address]       [place], [place]  [address]       Ph: [phone]       Fax: [phone]   RxID:   [id number]

## 2010-12-31 NOTE — Assessment & Plan Note (Signed)
Summary: 7 wk rov/njr   Vital Signs:  Patient profile:   50 year old female Menstrual status:  perimenopausal LMP:     09/10/2010 Weight:      109 pounds Pulse rate:   72 / minute BP sitting:   110 / 80  (right arm) Cuff size:   regular  Vitals Entered By: Romualdo Bolk, CMA (AAMA) (September 23, 2010 8:25 AM)  Serial Vital Signs/Assessments:  Time      Position  BP       Pulse  Resp  Temp     By           R Arm     150/80                         [name]           L Arm     138/84                         [name]  Comments: reg arm sitting  By: [name]   CC: Follow-up visit on bp, Hypertension Management, On 10/23 pt has some dizziness with nausea, hot flashes and sweating.  LMP (date): 09/10/2010 LMP - Character: spotting Menarche (age onset years): 13   Menses interval (days): varies Menstrual flow (days): spotting Enter LMP: 09/10/2010 Last PAP Result NEGATIVE FOR INTRAEPITHELIAL LESIONS OR MALIGNANCY.   History of Present Illness: Nichole [name] comes in today  for above       although her appt is tomorrow she is seen today for follow up of BP and poss se of meds .  had face swelling  and ace arb was stopped  in ncase related  Since last visit no major change in health. She is still unemployed  . She still gets at times sweats and  feels funny  and feels near syncopal  but  rarely   Once  had Lemonade.  and felt better . ? if sugar related .  Etoh on weekend and taking new med   1 by mouth once daily  for about 2 weeks.    No seen se and no more swelling.  No seizures  Ocass vag spotting  Hypertension History:      She complains of palpitations and syncope, but denies headache, chest pain, dyspnea with exertion, orthopnea, PND, peripheral edema, visual symptoms, neurologic problems, and side effects from treatment.  She notes no problems with any antihypertensive medication side effects.  occ heart palps, occ dizziness with hot flashes  and sweating.        Positive major cardiovascular risk factors include hypertension and current tobacco user.  Negative major cardiovascular risk factors include female age less than 109 years old.     Preventive Screening-Counseling & Management  Alcohol-Tobacco     Alcohol drinks/day: 2     Alcohol type: beers and mix drink on weekends     Smoking Status: current     Smoking Cessation Counseling: yes     Packs/Day: 0.5  Caffeine-Diet-Exercise     Caffeine use/day: 0     Does Patient Exercise: yes     Type of exercise: walking     Exercise (avg: min/session): <30     Times/week: 2  Current Medications (verified): 1)  Metoprolol Tartrate 50 Mg  Tabs (Metoprolol Tartrate) .... 1.5  By Mouth Qam and 1  At Night 2)  Amlodipine Besylate 5 Mg Tabs (Amlodipine Besylate) .Marland Kitchen.. 1 By Mouth Once Daily or As Directed For High Blood Pressure  Allergies (verified): 1)  ! * Lisinopril 2)  * Benicar Hctz  Past History:  Past medical, surgical, family and social histories (including risk factors) reviewed, and no changes noted (except as noted below).  Past Medical History: Hypertension hosp for seizure ? etho wd 3/14 09   vs isolated  Tachypalpitations  nl echo and Holter PVCs and PACs  Syncope probably neurally mediated DUB with submucosal fibroid June 2011    Consults [facility]  Past Surgical History: Reviewed history from 01/25/2008 and no changes required. knee surg 2001  Past History:  Care Management: Neurology: [facility]: Dr. Graciela [facility]   Family History: Reviewed history from 05/29/2009 and no changes required. spinomuscular atrophy  no anemia  Father:  MI   CVA  ,  31 died  Mother:  Kidney removed  for cyst  and overweight.  HT   Siblings:   bro HT    Thyroid negative .  osteoprosis  Aunt  renal transplant.  ? no anemia   Social History: Reviewed history from 03/18/2010 and no changes required. Single unemployed currently   Current Smoker no insurance    on [employer] program  stopped all alcohol since original episode more recently  now about 3-5 on the weekend  non during th week  unemployed   Review of Systems  The patient denies anorexia, fever, weight loss, weight gain, vision loss, decreased hearing, chest pain, peripheral edema, prolonged cough, melena, hematochezia, severe indigestion/heartburn, hematuria, muscle weakness, transient blindness, difficulty walking, abnormal bleeding, enlarged lymph nodes, and angioedema.    Physical Exam  General:  Well-developed,well-nourished,in no acute distress; alert,appropriate and cooperative throughout examination Head:  normocephalic and atraumatic.   Eyes:  vision grossly intact, pupils equal, and pupils round.   Neck:  No deformities, masses, or tenderness noted. Lungs:  normal respiratory effort and no intercostal retractions.   Heart:  normal rate, regular rhythm, no murmur, and no gallop.   Msk:  no joint warmth and no redness over joints.   Pulses:  pulses intact without delay   Neurologic:  nl gait  non focal  Skin:  no ecchymoses and no petechiae.   Cervical Nodes:  No lymphadenopathy noted Psych:  Oriented X3, memory intact for recent and remote, normally interactive, good eye contact, not anxious appearing, and not depressed appearing.     Impression & Recommendations:  Problem # 1:  HYPERTENSION (ICD-401.9) still labile  alcohol intake seems moderate by hx but possible  to add to thhis Her updated medication list for this problem includes:    Metoprolol Tartrate 50 Mg Tabs (Metoprolol tartrate) .Marland Kitchen... 1.5  by mouth qam and 1 at night    Amlodipine Besylate 5 Mg Tabs (Amlodipine besylate) .Marland Kitchen... 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 if could be from alcohol   combined   Problem # 3:  SWEATING (ICD-780.8) spells  prob from lifestyle hard to telll without  readings.    not common but  doesnt seem hypotensive in office .   change eating habits and limit alcohol and tobacco.   stil possible to be related to menopause  also  Problem # 4:  TOBACCO USE (ICD-305.1) should stop   Complete Medication List: 1)  Metoprolol Tartrate  50 Mg Tabs (Metoprolol tartrate) .... 1.5  by mouth qam and 1 at night 2)  Amlodipine Besylate 5 Mg Tabs (Amlodipine besylate) .Marland Kitchen.. 1 by mouth once daily or as directed for high blood pressure  Other Orders: Hgb (95621) Fingerstick (30865)  Hypertension Assessment/Plan:      The patient's hypertensive risk group is category B: At least one risk factor (excluding diabetes) with no target organ damage.  Her calculated 10 year risk of coronary heart disease is 3 %.  Today's blood pressure is 110/80.  Her blood pressure goal is < 140/90.  Patient Instructions: 1)  continue amlodipine  5 mg every day. 2)  consider decreasing the metoprolol.  in the future  3)  Avoid  excess alcohol .  4)  Small frequent meals/ snack with protein  and avoid simple sugars and see if spells get better.  5)  record about 2 weeks of your eating and spells .  6)  your hg is about the smae and you are slightly anemic . 7)  Please schedule a follow-up appointment in 3 months .   Contraindications/Deferment of Procedures/Staging:    Test/Procedure: FLU VAX    Reason for deferment: patient declined    Orders Added: 1)  Hgb [85018] 2)  Fingerstick [36416] 3)  Est. Patient Level IV [78469]    Laboratory Results   CBC   HGB:  10.8 g/dL   (Normal Range: 62.9-52.8 in Males, 12.0-15.0 in Females) Comments: Rita Ohara  September 23, 2010 9:02 AM

## 2010-12-31 NOTE — Assessment & Plan Note (Signed)
Summary: follow up on meds/ssc   Vital Signs:  Patient profile:   50 year old female Menstrual status:  perimenopausal Height:      59.5 inches Weight:      111 pounds BMI:     22.12 Pulse rate:   66 / minute BP sitting:   110 / 60  (left arm) Cuff size:   Peds  Vitals Entered By: Romualdo Bolk, CMA (AAMA) (March 18, 2010 8:32 AM)  Serial Vital Signs/Assessments:  Time      Position  BP       Pulse  Resp  Temp     By                     140/84                         [name]  CC: Follow-up visit on meds, Hypertension Management   History of Present Illness: Nichole [name]  comesin for follow up of multiple medical problems Since her last visit she has lost  her job  ( 3-4 months) and $ more of an issue.She saw hematology who was planning  Bone Marrow  if no improvement in anemia but not currently going back cause of cost.    Not t aking b12 currently  HT UP and down no chang in meds  still feels faint at times ( had cards eval)   Menses  now spotting  much of the month for the last few months . Has a calendar  sometimes has clots and sometimes spotting.   no significant  cramps with this or hot flushes .   Hypertension History:      She complains of syncope, but denies headache, chest pain, palpitations, dyspnea with exertion, orthopnea, PND, peripheral edema, visual symptoms, neurologic problems, and side effects from treatment.  She notes no problems with any antihypertensive medication side effects.  Pt states that she does get episodes where she gets dizzy and feels faint.        Positive major cardiovascular risk factors include hypertension and current tobacco user.  Negative major cardiovascular risk factors include female age less than 34 years old.     Preventive Screening-Counseling & Management  Alcohol-Tobacco     Alcohol drinks/day: 2     Alcohol type: beers and mix drink on weekends     Smoking Status: current     Smoking Cessation Counseling:  yes     Packs/Day: 0.5  Caffeine-Diet-Exercise     Caffeine use/day: 0     Does Patient Exercise: yes     Type of exercise: walking     Exercise (avg: min/session): <30     Times/week: 2  Current Medications (verified): 1)  Metoprolol Tartrate 50 Mg  Tabs (Metoprolol Tartrate) .... 1.5  By Mouth Qam and 1 At Night 2)  Lisinopril-Hydrochlorothiazide 20-25 Mg  Tabs (Lisinopril-Hydrochlorothiazide) .... 1/2 Two Times A Day 3)  Klor-Con M20 20 Meq Cr-Tabs (Potassium Chloride Crys Cr) .Marland Kitchen.. 1 By Mouth Once Daily  Allergies (verified): No Known Drug Allergies  Past History:  Care Management: Neurology: [facility]: Dr. Graciela Husbands Hematology PMH-FH-SH reviewed-no changes except otherwise noted  Social History: Single Current Smoker no insurance    on [employer] program  stopped all alcohol since original episode more recently  now about 6-7 on the weekend  non during th week  unemployed   Review of Systems  The  patient denies anorexia, fever, weight loss, weight gain, vision loss, decreased hearing, hoarseness, chest pain, dyspnea on exertion, prolonged cough, melena, hematochezia, severe indigestion/heartburn, hematuria, transient blindness, difficulty walking, enlarged lymph nodes, and angioedema.         no more seizures   Physical Exam  General:  alert, well-developed, well-nourished, and well-hydrated.  slender in nad  Head:  normocephalic and atraumatic.   Eyes:  vision grossly intact and pupils equal.   Ears:  no external deformities.   Neck:  No deformities, masses, or tenderness noted. Lungs:  Normal respiratory effort, chest expands symmetrically. Lungs are clear to auscultation, no crackles or wheezes. Heart:  Normal rate and regular rhythm. S1 and S2 normal without gallop, murmur, click, rub or other extra sounds.no lifts.   Abdomen:  Bowel sounds positive,abdomen soft and non-tender without masses, organomegaly or   noted. Pulses:  nl cap   refill    Extremities:  no clubbing cyanosis or edema  Neurologic:  alert & oriented X3 and gait normal.   Skin:  no ecchymoses, no petechiae, and no purpura.   Cervical Nodes:  No lymphadenopathy noted Psych:  Oriented X3, good eye contact, not anxious appearing, and not depressed appearing.   reviewed log   and heme consult   Impression & Recommendations:  Problem # 1:  EXCESSIVE MENSTRUAL BLEEDING (ICD-626.2) prolonged spotting    in the setting of anemia.      ? cause needs gyne eval    consider polyp r/o other  pathology    she has no insurance although is in the [place]  plan for assistance .     Orders: TLB-CBC Platelet - w/Differential (85025-CBCD) Venipuncture (16109) Gynecologic Referral (Gyn)  Problem # 2:  HYPERTENSION (ICD-401.9) up and down   continuing    rx limited by cost  although better than when working Her updated medication list for this problem includes:    Metoprolol Tartrate 50 Mg Tabs (Metoprolol tartrate) .Marland Kitchen... 1.5  by mouth qam and 1 at night    Lisinopril-hydrochlorothiazide 20-25 Mg Tabs (Lisinopril-hydrochlorothiazide) .Marland Kitchen... 1/2 two times a day  Orders: TLB-CBC Platelet - w/Differential (85025-CBCD) TLB-BMP (Basic Metabolic Panel-BMET) (80048-METABOL) Venipuncture (60454)  Problem # 3:  UNSPECIFIED ANEMIA (ICD-285.9)  The following medications were removed from the medication list:    Cobal-1000 1000 Mcg/ml Soln (Cyanocobalamin) ..... 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 The following medications were removed from the medication list:    Cobal-1000 1000 Mcg/ml Soln (Cyanocobalamin) ..... Inject 1 ml im weekly for 6 weeks or as directed  Problem # 5:  TOBACCO USE (ICD-305.1) rec dc   Complete Medication List: 1)   Metoprolol Tartrate 50 Mg Tabs (Metoprolol tartrate) .... 1.5  by mouth qam and 1 at night 2)  Lisinopril-hydrochlorothiazide 20-25 Mg Tabs (Lisinopril-hydrochlorothiazide) .... 1/2 two times a day 3)  Klor-con M20 20 Meq Cr-tabs (Potassium chloride crys cr) .Marland Kitchen.. 1 by mouth once daily  Hypertension Assessment/Plan:      The patient's hypertensive risk group is category B: At least one risk factor (excluding diabetes) with no target organ damage.  Her calculated 10 year risk of coronary heart disease is 2 %.  Today's blood pressure is 110/60.  Her blood pressure goal is < 140/90.  Patient Instructions: 1)  continue medications 2)  You will be informed of lab results when available.  3)  will arrange    gyne consult .

## 2010-12-31 NOTE — Letter (Signed)
Summary: [facility]  [facility]   Imported By: Maryln Gottron 12/07/2009 12:48:10  _____________________________________________________________________  External Attachment:    Type:   Image     Comment:   External Document

## 2011-01-01 ENCOUNTER — Encounter: Payer: Self-pay | Admitting: Gastroenterology

## 2011-01-01 ENCOUNTER — Ambulatory Visit (INDEPENDENT_AMBULATORY_CARE_PROVIDER_SITE_OTHER)
Admission: RE | Admit: 2011-01-01 | Discharge: 2011-01-01 | Disposition: A | Discharge status: Home / Self Care | Payer: Self-pay | Source: Ambulatory Visit | Attending: Gastroenterology | Admitting: Gastroenterology

## 2011-01-01 DIAGNOSIS — K859 Acute pancreatitis without necrosis or infection, unspecified: Secondary | ICD-10-CM

## 2011-01-02 MED ORDER — IOHEXOL 300 MG/ML  SOLN
95.0000 mL | Freq: Once | INTRAMUSCULAR | Status: DC | PRN
  Administered 2011-01-01: 95 mL via INTRAVENOUS

## 2011-01-02 NOTE — Assessment & Plan Note (Signed)
Summary: ABDOMINAL PAIN AND BLOATING NO BOWEL MOVEMENT X 4 DAYS//SLM   Vital Signs:  Patient profile:   50 year old female Menstrual status:  perimenopausal LMP:     09/16/2010 Weight:      109 pounds Temp:     98.2 degrees F oral Pulse rate:   66 / minute BP sitting:   100 / 60  (left arm) Cuff size:   regular  Vitals Entered By: Romualdo Bolk, CMA (AAMA) (December 03, 2010 2:48 PM) zCC: Abdominal pain in epigastric region going down. Pt states that the pain has been going on and off since Christmas but got worse on 12/31. Pt's last BM was on 12/30. No fever. Pain is a pain scale of 10. No swelling or bloating. Pt last eat on 12/31 but is keeping fluids down and crackers. LMP (date): 09/16/2010 LMP - Character: spotting Menarche (age onset years): 13   Menses interval (days): varies Menstrual flow (days): spotting Enter LMP: 09/16/2010 Last PAP Result NEGATIVE FOR INTRAEPITHELIAL LESIONS OR MALIGNANCY.   History of Present Illness: Nichole [name] comes in today  for acute work in for above.    ONset of 2 days of abd pain that is worse after drinking fluids   vomited x 1 with solids .  pain located mostly epigastrum middle but all over and hurts into  back also but no fever chills uti signs . Pain is persistent  progressive   exacerbated as above.   decrease bms  as aboe.  Sates alos was a bity gassy at x mas time but pain such as above didnt start until 2 days ago. denies excess alcohol caffiene. eadvil aleve .  NO blood in stool .  No meds reported taken for this.  Never had this pain before.  Preventive Screening-Counseling & Management  Alcohol-Tobacco     Alcohol drinks/day: 2     Alcohol type: beers and mix drink on weekends     Smoking Status: current     Smoking Cessation Counseling: yes     Packs/Day: 0.5  Caffeine-Diet-Exercise     Caffeine use/day: 0     Does Patient Exercise: yes     Type of exercise: walking     Exercise (avg: min/session): <30  Times/week: 2  Current Medications (verified): 1)  Metoprolol Tartrate 50 Mg  Tabs (Metoprolol Tartrate) .... 1.5  By Mouth Qam and 1 At Night 2)  Amlodipine Besylate 5 Mg Tabs (Amlodipine Besylate) .Marland Kitchen.. 1 By Mouth Once Daily or As Directed For High Blood Pressure  Allergies (verified): 1)  ! * Lisinopril 2)  * Benicar Hctz  Past History:  Past medical, surgical, family and social histories (including risk factors) reviewed, and no changes noted (except as noted below).  Past Medical History: Reviewed history from 09/23/2010 and no changes required. Hypertension hosp for seizure ? etho wd 3/14 09   vs isolated  Tachypalpitations  nl echo and Holter PVCs and PACs  Syncope probably neurally mediated DUB with submucosal fibroid June 2011    Consults [facility]  Past Surgical History: Reviewed history from 01/25/2008 and no changes required. knee surg 2001  Family History: Reviewed history from 05/29/2009 and no changes required. spinomuscular atrophy  no anemia  Father:  MI   CVA  ,  15 died  Mother:  Kidney removed  for cyst  and overweight.  HT   Siblings:   bro HT    Thyroid negative .  osteoprosis  Aunt  renal  transplant.  ? no anemia   Social History: Reviewed history from 09/23/2010 and no changes required. Single unemployed currently  Current Smoker no insurance    on [employer] program  stopped all alcohol since original episode more recently  now about 3-5 on the weekend  non during th week  unemployed   Review of Systems       The patient complains of anorexia.  The patient denies vision loss, chest pain, dyspnea on exertion, peripheral edema, melena, hematochezia, severe indigestion/heartburn, hematuria, incontinence, muscle weakness, transient blindness, abnormal bleeding, and enlarged lymph nodes.    Physical Exam  General:  wd slender in mod distress when moves walks or bends over.  alert and aarticulate Head:  Normocephalic and atraumatic without  obvious abnormalities. No apparent alopecia or balding. Eyes:  non ictericvision grossly intact.   Ears:  R ear normal, L ear normal, and no external deformities.   Nose:  no external deformity and no external erythema.   Mouth:  no swelling Neck:  No deformities, masses, or tenderness noted. Lungs:  Normal respiratory effort, chest expands symmetrically. Lungs are clear to auscultation, no crackles or wheezes.no dullness.   Heart:  Normal rate and regular rhythm. S1 and S2 normal without gallop, murmur, click, rub or other extra sounds. Abdomen:  tender mid epigastrum to ruq with guarding   and some alos in lower mid abd.   no distention, no masses, no hepatomegaly, and no splenomegaly.   bs some decrease but present  in RLQ no cva pain  Msk:  no joint warmth and no redness over joints.   Pulses:  nl cap refill but skin is ? doughy on hands not abdomen Extremities:  no edema Neurologic:  non focal alert & oriented X3 and cranial nerves II-XII intact.   Skin:  color normal, no ecchymoses, no petechiae, and no purpura.   Cervical Nodes:  No lymphadenopathy noted Psych:  Oriented X3, good eye contact, not anxious appearing, and not depressed appearing.     Impression & Recommendations:  Problem # 1:  ABDOMINAL PAIN, GENERALIZED (ICD-789.07) Assessment New  guarding in epigastric and ruq are but also  referred to lower abdomen    Acute process ? PUD r/o pancreatitis gastritis gallstone  acute process .    labs and cp /abdomen and pelvis stat today   ( samples of aciphex given that we can begin if no acute process on ct labs )   .   Orders: UA Dipstick w/o Micro (automated)  (81003) Venipuncture (16109) TLB-Hepatic/Liver Function Pnl (80076-HEPATIC) TLB-CBC Platelet - w/Differential (85025-CBCD) TLB-BMP (Basic Metabolic Panel-BMET) (80048-METABOL) TLB-Amylase (82150-AMYL) TLB-Lipase (83690-LIPASE) H. pylori (60454) Misc. Referral (Misc. Ref)  Problem # 2:  HYPERTENSION  (ICD-401.9) Assessment: Unchanged  Her updated medication list for this problem includes:    Metoprolol Tartrate 50 Mg Tabs (Metoprolol tartrate) .Marland Kitchen... 1.5  by mouth qam and 1 at night    Amlodipine Besylate 5 Mg Tabs (Amlodipine besylate) .Marland Kitchen... 1 by mouth once daily or as directed for high blood pressure  BP today: 100/60 Prior BP: 110/80 (09/23/2010)  Prior 10 Yr Risk Heart Disease: 3 % (09/23/2010)  Labs Reviewed: K+: 3.6 (03/18/2010) Creat: : 0.7 (03/18/2010)   Chol: 154 (03/14/2009)   HDL: 117.80 (03/14/2009)   LDL: 10 (03/14/2009)   TG: 131.0 (03/14/2009)  Complete Medication List: 1)  Metoprolol Tartrate 50 Mg Tabs (Metoprolol tartrate) .... 1.5  by mouth qam and 1 at night 2)  Amlodipine Besylate 5 Mg Tabs (Amlodipine  besylate) .Marland Kitchen.. 1 by mouth once daily or as directed for high blood pressure  Patient Instructions: 1)  lab and scan  call report   notify   of results and then can follow up . with me  may need to see GI .   HOme phone  [phone] ( no cell )  Orders Added: 1)  UA Dipstick w/o Micro (automated)  [81003] 2)  Venipuncture [36415] 3)  TLB-Hepatic/Liver Function Pnl [80076-HEPATIC] 4)  TLB-CBC Platelet - w/Differential [85025-CBCD] 5)  TLB-BMP (Basic Metabolic Panel-BMET) [80048-METABOL] 6)  TLB-Amylase [82150-AMYL] 7)  TLB-Lipase [83690-LIPASE] 8)  H. pylori [87339] 9)  Misc. Referral [Misc. Ref] 10)  Est. Patient Level IV [45409]    Laboratory Results   Urine Tests    Routine Urinalysis   Color: yellow Appearance: Clear Glucose: negative   (Normal Range: Negative) Bilirubin: 3+   (Normal Range: Negative) Ketone: 4+   (Normal Range: Negative) Spec. Gravity: >=1.030   (Normal Range: 1.003-1.035) Blood: 1+   (Normal Range: Negative) pH: 6.0   (Normal Range: 5.0-8.0) Protein: 2+   (Normal Range: Negative) Urobilinogen: 1.0   (Normal Range: 0-1) Nitrite: negative   (Normal Range: Negative) Leukocyte Esterace: negative   (Normal Range: Negative)     Comments: Rita Ohara  December 03, 2010 4:07 PM

## 2011-01-02 NOTE — Progress Notes (Signed)
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  Initial call taken by: Leanor Kail [facility],  December 04, 2010 2:35 PM  Follow-up for Phone Call        I have left a message for Aurther Loft to call back Darcey Nora RN, [facility]  December 04, 2010 3:25 PM  I spoke with the patient, she will come in the am and see Amy Esterwood PA at 9:00. I have asked her to please come at 8:45. Darcey Nora RN, [facility]  December 04, 2010 4:29 PM Follow-up by: Darcey Nora RN, CGRN,  December 04, 2010 4:29 PM

## 2011-01-02 NOTE — Assessment & Plan Note (Signed)
Summary: 3 month fup//ccm   Vital Signs:  Patient profile:   50 year old female Menstrual status:  perimenopausal Weight:      112 pounds Pulse rate:   66 / minute BP sitting:   110 / 60  (left arm) Cuff size:   regular  Vitals Entered By: Romualdo Bolk, CMA (AAMA) (December 23, 2010 8:17 AM)  CC: follow-up visit, Hypertension Management   History of Present Illness: Nichole [name] comes in today  for follow up of BP issues.  Had acute pancereatitis  and  now no eth and eating better.   has some luq tender under rib cage   appetite good.    no bleeding swelling  . cough  has also cut out tobacco and craving sugar  is on a liquid diet and to advance. get a follow up ct and rov in 6 months if  ing ok   eating a lot  increase appetite .    Bp 125 range   .  BP mds.  Taking   5o mg metoprolol once daily  not taking bp meds over weekend but took this am about an hours ago. NO cp sob no palpitations   blsiterson feet for a few days   itched  and now better   Hypertension History:      She denies headache, chest pain, palpitations, dyspnea with exertion, orthopnea, PND, peripheral edema, visual symptoms, neurologic problems, syncope, and side effects from treatment.  She notes no problems with any antihypertensive medication side effects.        Positive major cardiovascular risk factors include hypertension and current tobacco user.  Negative major cardiovascular risk factors include female age less than 57 years old.     Preventive Screening-Counseling & Management  Alcohol-Tobacco     Alcohol drinks/day: 0     Alcohol type: beers and mix drink on weekends     Smoking Status: current     Smoking Cessation Counseling: yes     Packs/Day: 0.5  Caffeine-Diet-Exercise     Caffeine use/day: 0     Does Patient Exercise: yes     Type of exercise: walking     Exercise (avg: min/session): <30     Times/week: 2  Comments: to ocntinue alcohol free  Current Medications  (verified): 1)  Metoprolol Tartrate 50 Mg  Tabs (Metoprolol Tartrate) .... 1.5  By Mouth Qam and 1 At Night 2)  Amlodipine Besylate 5 Mg Tabs (Amlodipine Besylate) .Marland Kitchen.. 1 By Mouth Once Daily or As Directed For High Blood Pressure  Allergies (verified): 1)  ! * Lisinopril 2)  * Benicar Hctz  Past History:  Past medical, surgical, family and social histories (including risk factors) reviewed, and no changes noted (except as noted below).  Past Medical History: Reviewed history from 12/16/2010 and no changes required. hosp for seizure ? etho wd 3/14 09   vs isolated  Tachypalpitations  nl echo and Holter PVCs and PACs  Syncope probably neurally mediated DUB with submucosal fibroid June 2011 WEIGHT LOSS-ABNORMAL (ICD-783.21) ACUTE PANCREATITIS (ICD-577.0) ABDOMINAL PAIN, GENERALIZED (ICD-789.07) SWEATING (ICD-780.8) ? of ANGIOEDEMA (ICD-995.1) EXCESSIVE MENSTRUAL BLEEDING (ICD-626.2) PERIMENOPAUSAL SYNDROME (ICD-627.9) ANEMIA, MACROCYTIC (ICD-281.9) ANEMIA, B12 DEFICIENCY (ICD-281.1) ROUTINE GYNECOLOGICAL EXAM (ICD-V72.31) HEALTH MAINTENANCE EXAM, ADULT (ICD-V70.0) THYROID FUNCTION TEST, ABNORMAL (ICD-794.5) SINUSITIS - ACUTE-NOS (ICD-461.9) PALPITATIONS (ICD-785.1) SYNCOPE (ICD-780.2) LEG PAIN, BILATERAL (ICD-729.5) ? 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 of UNS ADVRS EFF OTH RX MEDICINAL&BIOLOGICAL SBSTNC (YQM-578.46)  HYPERTENSION (ICD-401.9) Consults [facility]  Past Surgical History: Reviewed history from 01/25/2008 and no changes required. knee surg 2001  Past History:  Care Management: Neurology: [facility]: Dr. Graciela Husbands Hematology [facility]  Family History: Reviewed history from 12/16/2010 and no changes required. spinomuscular atrophy  no anemia  Father:  MI   CVA  ,  79 died  Mother:  Kidney removed  for cyst  and overweight.  HT   Siblings:   bro HT      Thyroid negative .   Aunt  renal transplant.  ? no anemia  No FH of Colon Cancer:  Social History: Reviewed history from 12/05/2010 and no changes required. Single unemployed currently  Current Smoker no insurance    on [employer] program  stopped all alcohol since original episode more recently  now about 3-5 on the weekend  non during th week  UPDATE 1/12-LAST ETOH 11/23/10 unemployed   Review of Systems  The patient denies anorexia, fever, weight loss, prolonged cough, abdominal pain, melena, hematochezia, transient blindness, difficulty walking, depression, abnormal bleeding, enlarged lymph nodes, and angioedema.         hot flushes worse than usual only at night.  lmp over 6 months ago .  no feers chillls.  Physical Exam  General:  Well-developed,well-nourished,in no acute distress; alert,appropriate and cooperative throughout examination Head:  normocephalic and atraumatic.   Lungs:  normal respiratory effort and no intercostal retractions.   Heart:  normal rate, regular rhythm, no murmur, no gallop, and no rub.   Abdomen:  soft, normal bowel sounds, no distention, no masses, no guarding, no rigidity, no hepatomegaly, and no splenomegaly.  some tenderlass very lateral luq near abd wall muscles  no g or r   epigastrum non tender Pulses:  nl cap refill  Extremities:  no clubbing cyanosis or edema  Skin:  discete pink red papules on dorsum of toes left   no other ash  Cervical Nodes:  No lymphadenopathy noted Psych:  Oriented X3, normally interactive, not anxious appearing, and not depressed appearing.     Impression & Recommendations:  Problem # 1:  HYPERTENSION (ICD-401.9) Assessment Improved  better off alcohol  and reinforced this      change med  may wean as appropriate  Her updated medication list for this problem includes:    Metoprolol Tartrate 50 Mg Tabs (Metoprolol tartrate) .Marland Kitchen... 1/2 po bid    Amlodipine Besylate 5 Mg Tabs (Amlodipine besylate) .Marland Kitchen... 1 by mouth once  daily or as directed for high blood pressure  BP today: 110/60 Prior BP: 124/74 (12/16/2010)  10 Yr Risk Heart Disease: 2 % Prior 10 Yr Risk Heart Disease: 3 % (09/23/2010)  Labs Reviewed: K+: 4.4 (12/05/2010) Creat: : 0.8 (12/05/2010)   Chol: 154 (03/14/2009)   HDL: 117.80 (03/14/2009)   LDL: 10 (03/14/2009)   TG: 131.0 (03/14/2009)  Problem # 2:  ACUTE PANCREATITIS (ICD-577.0) Assessment: Improved has mild luq pain acutally seems ms   no masses and no alarm features  advance diet as per Dr Arlyce Dice  Problem # 3:  TOBACCO USE (ICD-305.1) Assessment: Improved stopping and appetite increase  Problem # 4:  RASH (ICD-782.1) Assessment: New ? 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   Complete  Medication List: 1)  Metoprolol Tartrate 50 Mg Tabs (Metoprolol tartrate) .... 1/2 po bid 2)  Amlodipine Besylate 5 Mg Tabs (Amlodipine besylate) .Marland Kitchen.. 1 by mouth once daily or as directed for high blood pressure  Hypertension Assessment/Plan:      The patient's hypertensive risk group is category B: At least one risk factor (excluding diabetes) with no target organ damage.  Her calculated 10 year risk of coronary heart disease is 2 %.  Today's blood pressure is 110/60.  Her blood pressure goal is < 140/90.  Patient Instructions: 1)  change    metoprolol to 50 mg 1/2 by mouth two times a day and continue the amlodipine. 2)    No alcohol . 3)  ROV in 2 months and consider repeat your blood count  .  reassess your medication.  4)  if needed can use hydocortisone  crea m if needed for itching.  5)  Continue follow up with GI . Prescriptions: AMLODIPINE BESYLATE 5 MG TABS (AMLODIPINE BESYLATE) 1 by mouth once daily or as directed for high blood pressure  #30 x 6   Entered and Authorized by:   [name]    Signed by:   [name] on 12/23/2010   Method used:   Electronically to        [facility] [phone]* (retail)       [address]       [place], [place]  [address]       Ph: [phone]       Fax: [phone]   RxID:   [id number] METOPROLOL TARTRATE 50 MG  TABS (METOPROLOL TARTRATE) 1/2 po bid  #30 x 6   Entered and Authorized by:   [name]   Signed by:   [name] on 12/23/2010   Method used:   Electronically to        [facility] [phone]* (retail)       [address]       [place], [place]  [address]       Ph: [phone]       Fax: [phone]   RxID:   [id number]    Orders Added: 1)  Est. Patient Level III [09323]

## 2011-01-02 NOTE — Progress Notes (Signed)
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 I told her I anticipated you will recommend  GI referral. Radiologist stated tube insertion for drainage possible if symptoms progress. Hopp  ---- 12/03/2010 5:39 PM, [name] wrote: Alfonse Flavors, was told you are on "call"  THis lady is getting ct  tonight  unsure what is going on but very tender epigastrum .   told her to go to er if  got worse , but report will come through you .  gave her samples  of aciphex if scan ok. and then I can decide on follow up .  thanks  Happy New Year! Leyland Kenna ------------------------------

## 2011-01-02 NOTE — Assessment & Plan Note (Signed)
Summary: F/U Pancreatitis, saw Amy Esterwood PA-C 3pm appt    History of Present Illness Visit Type: Follow-up Visit Primary GI MD: Melvia Heaps MD [facility] Primary Provider: Berniece Andreas, MD Requesting Provider: na Chief Complaint: F/u for pancreatitis. Pt states that she is great and denies any GI complaints  History of Present Illness:   Ms. Nichole [name] has returned for followup of her acute pancreatitis.  Since her last visit her abdominal pain has entirely subsided.  She is hungry.  She has no GI complaints.  CT Scan from December 03, 2009, which I reviewed, demonstrated multiple cystic lesions in the head of the pancreas consistent with pancreatic pseudocysts.  A cystic neoplasm cannot be excluded from this exam.  The patient has abstained from alcohol use since her last visit.   GI Review of Systems      Denies abdominal pain, acid reflux, belching, bloating, chest pain, dysphagia with liquids, dysphagia with solids, heartburn, loss of appetite, nausea, vomiting, vomiting blood, weight loss, and  weight gain.        Denies anal fissure, black tarry stools, change in bowel habit, constipation, diarrhea, diverticulosis, fecal incontinence, heme positive stool, hemorrhoids, irritable bowel syndrome, jaundice, light color stool, liver problems, rectal bleeding, and  rectal pain.    Current Medications (verified): 1)  Metoprolol Tartrate 50 Mg  Tabs (Metoprolol Tartrate) .... 1.5  By Mouth Qam and 1 At Night 2)  Amlodipine Besylate 5 Mg Tabs (Amlodipine Besylate) .Marland Kitchen.. 1 By Mouth Once Daily or As Directed For High Blood Pressure  Allergies (verified): 1)  ! * Lisinopril 2)  * Benicar Hctz  Past History:  Past Medical History: hosp for seizure ? etho wd 3/14 09   vs isolated  Tachypalpitations  nl echo and Holter PVCs and PACs  Syncope probably neurally mediated DUB with submucosal fibroid June 2011 WEIGHT LOSS-ABNORMAL (ICD-783.21) ACUTE PANCREATITIS (ICD-577.0) ABDOMINAL PAIN,  GENERALIZED (ICD-789.07) SWEATING (ICD-780.8) ? of ANGIOEDEMA (ICD-995.1) EXCESSIVE MENSTRUAL BLEEDING (ICD-626.2) PERIMENOPAUSAL SYNDROME (ICD-627.9) ANEMIA, MACROCYTIC (ICD-281.9) ANEMIA, B12 DEFICIENCY (ICD-281.1) ROUTINE GYNECOLOGICAL EXAM (ICD-V72.31) HEALTH MAINTENANCE EXAM, ADULT (ICD-V70.0) THYROID FUNCTION TEST, ABNORMAL (ICD-794.5) SINUSITIS - ACUTE-NOS (ICD-461.9) PALPITATIONS (ICD-785.1) SYNCOPE (ICD-780.2) LEG PAIN, BILATERAL (ICD-729.5) ? 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 of UNS ADVRS EFF OTH RX MEDICINAL&BIOLOGICAL SBSTNC (ZOX-096.04) HYPERTENSION (ICD-401.9) Consults [facility]  Past Surgical History: Reviewed history from 01/25/2008 and no changes required. knee surg 2001  Family History: spinomuscular atrophy  no anemia  Father:  MI   CVA  ,  89 died  Mother:  Kidney removed  for cyst  and overweight.  HT   Siblings:   bro HT    Thyroid negative .   Aunt  renal transplant.  ? no anemia  No FH of Colon Cancer:  Social History: Reviewed history from 12/05/2010 and no changes required. Single unemployed currently  Current Smoker no insurance    on [employer] program  stopped all alcohol since original episode more recently  now about 3-5 on the weekend  non during th week  UPDATE 1/12-LAST ETOH 11/23/10 unemployed   Review of Systems  The patient denies allergy/sinus, anemia, anxiety-new, arthritis/joint pain, back pain, blood in urine, breast changes/lumps, change in vision, confusion, cough, coughing up blood, depression-new, fainting, fatigue, fever, headaches-new, hearing problems, heart murmur, heart rhythm changes, itching, menstrual pain, muscle pains/cramps, night sweats, nosebleeds, pregnancy symptoms, shortness of breath, skin rash, sleeping problems, sore throat, swelling of feet/legs, swollen lymph glands, thirst -  excessive , urination - excessive ,  urination changes/pain, urine leakage, vision changes, and voice change.    Vital Signs:  Patient profile:   50 year old female Menstrual status:  perimenopausal Height:      59.5 inches Weight:      111 pounds BMI:     22.12 BSA:     1.45 Pulse rate:   60 / minute Pulse rhythm:   regular BP sitting:   124 / 74  (left arm) Cuff size:   regular  Vitals Entered By: Ok Anis CMA (December 16, 2010 3:33 PM)   Physical Exam  Additional Exam:  On physical exam she is a well-developed well-nourished female  Abdomen is without masses, tenderness organomegaly   Impression & Recommendations:  Problem # 1:  ACUTE PANCREATITIS (ICD-577.0) Assessment Improved  The patient was admonished to continue to abstain from alcohol.   She was instructed to increase her diet.  She will stay on pancreatic enzymes for the time being.  Orders: CT Abdomen (CT Abd)  Problem # 2:  GASTROINTESTINAL XRAY, ABNORMAL (ICD-793.4) She has pancreatic pseudocysts in the head of the pancreas.  A cystic neoplasm cannot be ruled out by this exam.  Recommendations #1 followup CT of the abdomen in approximately 3 weeks  Other Orders: T-Urine Pregnancy (in -house) (04540)  Patient Instructions: 1)  Copy sent to : Berniece Andreas, MD 2)  You CT will be at [facility] CT, at [address]  3)  You will arrive 15 minutes early to drink water per Pancreatic protocol 4)  The medication list was reviewed and reconciled.  All changed / newly prescribed medications were explained.  A complete medication list was provided to the patient / caregiver.

## 2011-01-02 NOTE — Assessment & Plan Note (Signed)
Summary: abnormal CT scan/? pancreatitis/pancreatic pseudocyst/sheri    History of Present Illness Visit Type: Initial Consult Primary GI MD: Melvia Heaps MD [facility] Primary Provider: Berniece Andreas, MD Requesting Provider: Berniece Andreas, MD Chief Complaint: Nichole [name] CT scan showing a pancreatic pseudocyst. Pt has mid abdominal pain with sharp intermittant pains on the left and right side of abdomen since Sunday that has gotten progressively worse. Pt denies any N/V but states when she ate some soup yesterday, the pain became worse and so she has not ate anything else. She has some overall fatigue.  History of Present Illness:   PLEASANT 49 YO FEMALE NEW TO GI TODAY,REFERRED BY DR. PANASH. PT WITH HX OF HTN ,AND CHRONIC ETOH USE. SHE HAD ONSET OF SXS ON 11/25/10 WITH MID BACK PAIN WHICH EVENTUALLY SREAD TO THE UPPER ABDOMEN. SHE HAD WAXING AND WANING PAIN OVER THE FOLLOWING FEW DAYS. NO  NAUSEA OR VOMITING, NO FEVER, CHILLS. ON 12/31 THE PAIN BECAME WORSE AND WAS CONSATNT OVER THE NEXT 4 DAYS. SHE HAS NOT BEEN ABLE TO EAT BUT HAS BEEN DRINKING FLUIDS. SHE HAS LOST 7 POUNDS. BM'S CONSTIPATED, NO HEME. HER LAST DRINK WAS ON 12/24. SHE SAYS SHE DOES NOT DRINK EVERY DAY-USUALLY 4 DAYS PER WEEK. SHE WILL DRINK BEER AND A COUPLE GIN AND TONICS.   PT WAS SEEN BY PRIMARY CARE ON 12/03/10. LABS ARE REMARKABLE FOR AMYLASE OF452,LIPASE OF 1227,LFTS ARE NORMAL. CBC SHOWS  AN MCV 112.  CT ABDOMEN/PELIVIS ON 1/3-SHOWS A LARGE MULTICYSTIC STRUCTURE INTHE HEAD IF THE PANCREAS 7.0 BY 4.5 BY 5.0.,FELT TO REFLECT PSEUDOCYSTS WITH ASSOCIATED MASS EFFECT ON SURROUNDING STRUCTURES,INCLUDING THE PORTAL VENOUS SYSTEM WHICH APPEARS PATENT.Marland KitchenMILD DILATION OF PANCREATIC DUCT3-4MM,LIVER AND GB NORMAL.,MINIMAL LEFT SIDED PERI-NEPHRIC STRANDING,ATHEROSCLEROTIC CHANGES.  PT HAS NOT HAD ANY ANALGESICS.   GI Review of Systems    Reports abdominal pain, loss of appetite, and  weight loss.     Location of  Abdominal pain: mid abdomen.  Weight loss of 7  pounds over 5 days.   Denies acid reflux, belching, bloating, chest pain, dysphagia with liquids, dysphagia with solids, heartburn, nausea, vomiting, vomiting blood, and  weight gain.        Denies anal fissure, black tarry stools, change in bowel habit, constipation, diarrhea, diverticulosis, fecal incontinence, heme positive stool, hemorrhoids, irritable bowel syndrome, jaundice, light color stool, liver problems, rectal bleeding, and  rectal pain.    Current Medications (verified): 1)  Metoprolol Tartrate 50 Mg  Tabs (Metoprolol Tartrate) .... 1.5  By Mouth Qam and 1 At Night 2)  Amlodipine Besylate 5 Mg Tabs (Amlodipine Besylate) .Marland Kitchen.. 1 By Mouth Once Daily or As Directed For High Blood Pressure  Allergies (verified): 1)  ! * Lisinopril 2)  * Benicar Hctz  Past History:  Past Medical History: Hypertension hosp for seizure ? etho wd 3/14 09   vs isolated  Tachypalpitations  nl echo and Holter PVCs and PACs  Syncope probably neurally mediated DUB with submucosal fibroid June 2011 HX B12 DEFICIENCY ETOH ABUSE    Consults [facility]  Past Surgical History: Reviewed history from 01/25/2008 and no changes required. knee surg 2001  Family History: Reviewed history from 05/29/2009 and no changes required. spinomuscular atrophy  no anemia  Father:  MI   CVA  ,  32 died  Mother:  Kidney removed  for cyst  and overweight.  HT   Siblings:   bro HT    Thyroid negative .   Aunt  renal transplant.  ? no  anemia   Social History: Reviewed history from 09/23/2010 and no changes required. Single unemployed currently  Current Smoker no insurance    on [employer] program  stopped all alcohol since original episode more recently  now about 3-5 on the weekend  non during th week  UPDATE 1/12-LAST ETOH 11/23/10 unemployed   Review of Systems       The patient complains of fatigue.  The patient denies allergy/sinus, anemia, anxiety-new, arthritis/joint pain, back  pain, blood in urine, breast changes/lumps, change in vision, confusion, cough, coughing up blood, depression-new, fainting, fever, headaches-new, hearing problems, heart murmur, heart rhythm changes, itching, menstrual pain, muscle pains/cramps, night sweats, nosebleeds, pregnancy symptoms, shortness of breath, skin rash, sleeping problems, sore throat, swelling of feet/legs, swollen lymph glands, thirst - excessive , urination - excessive , urination changes/pain, urine leakage, vision changes, and voice change.         SEE HPI  Vital Signs:  Patient profile:   50 year old female Menstrual status:  perimenopausal Height:      59.5 inches Weight:      104.50 pounds BMI:     20.83 Pulse rate:   60 / minute Pulse rhythm:   regular BP sitting:   138 / 88  (left arm) Cuff size:   regular  Vitals Entered By: Christie Nottingham CMA Duncan Dull) (December 05, 2010 9:03 AM)  Physical Exam  General:  Well developed, ,THIN, no acute distress. Head:  Normocephalic and atraumatic. Eyes:  PERRLA, no icterus. Lungs:  Clear throughout to auscultation. Heart:  Regular rate and rhythm; no murmurs, rubs,  or bruits. Abdomen:  SOFT, TENDER ACROSS UPPER ABDOMEN,NO REBOUND, NO PALP MASS OR HSM,BS+ Rectal:  NOT DONE Extremities:  No clubbing, cyanosis, edema or deformities noted. Neurologic:  Alert and  oriented x4;  grossly normal neurologically.weakness noted.   Psych:  Alert and cooperative. Normal mood and affect.   Impression & Recommendations:  Problem # 1:  ACUTE PANCREATITIS (ICD-577.0) Assessment New 49 YO FEMALE WITH ACUTE PANCREATITIS ,VERY LIKELY ALCOHOL INDUCED. SHE HAS A MULTICYSTIC PANCREATIC HEAD  PPROBABLY REPRESENTING SEVERAL PSEUDOCYSTS,CANNOT R/O UNDERLYING MASS.SHE IS 11 DAYS  IN TO THIS ILLNESS . SHE APPEARS STABLE, AND LABS 1/3 SHOW NO EVIDENCE OF DEHYDRATION OR OTHER WORRISOME PARAMETERS FOR SEVERE PANCREATITIS.  REPEAT LABS TODAY. PUSH FLUID S, ANS START LOW FAT FULL LIQUID DIET,WITH  ENSURE OR BOOST SUPPLEMENTS 2-3 TIMES DAILY ADD CREON 6000-2 WITH EACH MEAL SAMPLES OF ACIPHEX 20 MG DAILY GIVEN START ULTRAM 50 MG Q 4-6 HOURS AS NEEDED FOR PAIN.  DISCUSSED NEED FOR ETOH ABSTINENCE AT LENGTH WITH PT. SHE MAY REQUIRE HOSPITALIZATION IF SHE IS UNABLE TO PROGRESS HER DIET SOON.,HOPEFULLY ANALGESICS WILL HELP. FOLLOW UP IN OFFICE IN ONE WEEK TO RE ASSESS.  SHE WILL NEED FURTHER IMAGEING, AND EUS BUT NOT UNTIL ACUTE INFLAMMATION SUBSIDES.  Problem # 2:  WEIGHT LOSS-ABNORMAL (ICD-783.21) Assessment: Comment Only SECONDARY TO ABOVE  Problem # 3:   ALCOHOL ABUSE (ICD-305.00) Assessment: Comment Only  Problem # 4:  HYPERTENSION (ICD-401.9) Assessment: Comment Only  Other Orders: TLB-BMP (Basic Metabolic Panel-BMET) (80048-METABOL) TLB-Amylase (82150-AMYL) TLB-CBC Platelet - w/Differential (85025-CBCD) TLB-Lipase (83690-LIPASE)  Patient Instructions: 1)  Please go to lab, basement level. 2)  We have given you samples of Adiphex and Creon. 3)  Aciphex: take 1 tab 30 min prior to breakfast. 4)  Creon:  take 2 tab before each meal.  5)  We sent a perscription for Ultram for pain to [facility]. 6)  We have given you an appointment card to see Dr Arlyce Dice as a follow up for [facility] 12-16-2010. 7)  Pancreas disease brochure given. 8)  Push fluids and drink Ensure or Boost twice daily. 9)  Copy sent to : Dr Berniece Andreas 10)  The medication list was reviewed and reconciled.  All changed / newly prescribed medications were explained.  A complete medication list was provided to the patient / caregiver. Prescriptions: ULTRAM 50 MG TABS (TRAMADOL HCL) Take 1 tab every 6 hours as needed for pain  #40 x 0   Entered by:   Lowry Ram NCMA   Authorized by:   Sammuel Cooper PA-c   Signed by:   Lowry Ram NCMA on 12/05/2010   Method used:   Electronically to        [facility] [phone]* (retail)       [address]       [place], [place]  [address]       Ph:  [phone]       Fax: [phone]   RxID:   [id number]

## 2011-01-06 ENCOUNTER — Other Ambulatory Visit: Payer: Self-pay

## 2011-01-08 NOTE — Miscellaneous (Signed)
Summary: Orders Update  Clinical Lists Changes  Orders: Added new Test order of T-CT Abdomen w/contrast ([address]) - Signed

## 2011-01-24 ENCOUNTER — Encounter: Payer: Self-pay | Admitting: Gastroenterology

## 2011-01-28 NOTE — Letter (Signed)
Summary: Appt Reminder 2  [facility]  [address]   [place], [place] [address]   Phone: [phone]  Fax: [phone]        January 24, 2011 MRN: [id number]    Nichole [name] [address] [place], [place]  [address]    Dear [name],   You have a return appointment with Dr. Arlyce Dice on 02/21/11 at 3:30pm.  Please remember to bring a complete list of the medicines you are taking, your insurance card and your co-pay.  If you have to cancel or reschedule this appointment, please call before 5:00 pm the evening before to avoid a cancellation fee.  If you have any questions or concerns, please call [phone].    Sincerely,    Selinda Michaels RN  Appended Document: Appt Reminder 2 Letter is mailed to the patient's home address

## 2011-02-13 ENCOUNTER — Encounter: Payer: Self-pay | Admitting: Internal Medicine

## 2011-02-17 ENCOUNTER — Ambulatory Visit: Payer: Self-pay | Admitting: Internal Medicine

## 2011-02-17 LAB — POCT PREGNANCY, URINE: Preg Test, Ur: NEGATIVE

## 2011-02-19 ENCOUNTER — Encounter: Payer: Self-pay | Admitting: Internal Medicine

## 2011-02-21 ENCOUNTER — Ambulatory Visit (INDEPENDENT_AMBULATORY_CARE_PROVIDER_SITE_OTHER): Payer: Self-pay | Admitting: Internal Medicine

## 2011-02-21 ENCOUNTER — Ambulatory Visit: Payer: Self-pay | Admitting: Gastroenterology

## 2011-02-21 ENCOUNTER — Encounter: Payer: Self-pay | Admitting: Internal Medicine

## 2011-02-21 ENCOUNTER — Other Ambulatory Visit (HOSPITAL_COMMUNITY)
Admission: RE | Admit: 2011-02-21 | Discharge: 2011-02-21 | Disposition: A | Discharge status: Home / Self Care | Payer: Self-pay | Source: Ambulatory Visit | Attending: Internal Medicine | Admitting: Internal Medicine

## 2011-02-21 VITALS — BP 116/70 | HR 72 | Ht 59.5 in | Wt 125.0 lb

## 2011-02-21 DIAGNOSIS — Z01419 Encounter for gynecological examination (general) (routine) without abnormal findings: Secondary | ICD-10-CM | POA: Insufficient documentation

## 2011-02-21 DIAGNOSIS — I10 Essential (primary) hypertension: Secondary | ICD-10-CM

## 2011-02-21 DIAGNOSIS — K862 Cyst of pancreas: Secondary | ICD-10-CM

## 2011-02-21 DIAGNOSIS — N959 Unspecified menopausal and perimenopausal disorder: Secondary | ICD-10-CM

## 2011-02-21 DIAGNOSIS — F17200 Nicotine dependence, unspecified, uncomplicated: Secondary | ICD-10-CM

## 2011-02-21 DIAGNOSIS — K859 Acute pancreatitis without necrosis or infection, unspecified: Secondary | ICD-10-CM

## 2011-02-21 DIAGNOSIS — D539 Nutritional anemia, unspecified: Secondary | ICD-10-CM

## 2011-02-21 DIAGNOSIS — Z0000 Encounter for general adult medical examination without abnormal findings: Secondary | ICD-10-CM

## 2011-02-21 DIAGNOSIS — K863 Pseudocyst of pancreas: Secondary | ICD-10-CM

## 2011-02-21 LAB — LIPID PANEL
HDL: 66.7 mg/dL (ref >39.00)
LDL Cholesterol: 67 mg/dL (ref 0–99)
Total CHOL/HDL Ratio: 2

## 2011-02-21 LAB — CBC WITH DIFFERENTIAL/PLATELET
Basophils Relative: 0.4 % (ref 0.0–3.0)
Eosinophils Absolute: 0.1 10*3/uL (ref 0.0–0.7)
Hemoglobin: 9.8 g/dL — ABNORMAL LOW (ref 12.0–15.0)
Lymphocytes Relative: 33.2 % (ref 12.0–46.0)
MCHC: 33.2 g/dL (ref 30.0–36.0)
MCV: 100.8 fl — ABNORMAL HIGH (ref 78.0–100.0)
Monocytes Absolute: 0.3 10*3/uL (ref 0.1–1.0)
Neutro Abs: 2.9 10*3/uL (ref 1.4–7.7)
RBC: 2.93 Mil/uL — ABNORMAL LOW (ref 3.87–5.11)

## 2011-02-21 LAB — BASIC METABOLIC PANEL
Calcium: 10 mg/dL (ref 8.4–10.5)
GFR: 173.52 mL/min (ref >60.00)
Sodium: 141 mEq/L (ref 135–145)

## 2011-02-21 LAB — POCT URINALYSIS DIPSTICK
Bilirubin, UA: NEGATIVE
Leukocytes, UA: NEGATIVE
Nitrite, UA: NEGATIVE
Urobilinogen, UA: 0.2
pH, UA: 6

## 2011-02-21 LAB — HEPATIC FUNCTION PANEL
Alkaline Phosphatase: 74 U/L (ref 39–117)
Bilirubin, Direct: 0.1 mg/dL (ref 0.0–0.3)

## 2011-02-21 NOTE — Patient Instructions (Addendum)
No alcohol for a year challenge  Continue exercise and stopping tobacco Will notify you  of labs when available. Then plan follow up depending on results . Or 6 months .  Keep Gi appt follow up  Will try to facilitate your fu appt.

## 2011-02-21 NOTE — Progress Notes (Signed)
  Subjective:    Patient ID: Nichole [name], female    DOB: [birth year], 50 y.o.   MRN: [id number]  HPI Patient comes in today for PV and gyne exam and medical management. No major change in health status since last visit .  She was dx with acute pancreatitis  In Jan felt to be etoh related .  Since then is much better and dc etoh although ? Of a Franc[place] Found tai with friends. No vomiting diarrhea or recent weight  Loss  Eating more Ice cream recently beginning to walk the park and feels well with this. BP sems better  Often forgets the second metoprolol in the evening.  Review of Systems    had spotting off and on last year  And was seen y gyne   Had bx   Then stopped.  Some sleep issues  and hot flushes . Left groin leg swel at time  Misses 1/2 doses  .  ocass   Some leg edema mild  Left  Legs cramps continue.   Objective:   Physical Exam Physical Exam: Vital signs reviewed ION:GEXB is a well-developed well-nourished alert cooperative  white female who appears her stated age in no acute distress.  HEENT: normocephalic  traumatic , Eyes: PERRL EOM's full, conjunctiva clear, Nares: paten,t no deformity discharge or tenderness., Ears: no deformity EAC's clear TMs with normal landmarks. Mouth: clear OP, no lesions, edema.  Moist mucous membranes. Dentition in adequate repair. NECK: supple without masses, thyromegaly or bruits. CHEST/PULM:  Clear to auscultation and percussion breath sounds equal no wheeze , rales or rhonchi. No chest wall deformities or tenderness. CV: PMI is nondisplaced, S1 S2 no gallops, murmurs, rubs. Peripheral pulses are full without delay.No JVD repeat BP 122/80 left arm   . Breast: normal by inspection . No dimpling, discharge, masses, tenderness or discharge . LN: no significant cervical axillary inguinal adenopathy ABDOMEN: Bowel sounds normal nontender  No guard or rebound, no hepato splenomegal no CVA tenderness.  No hernia. Extremtities:  No clubbing cyanosis or edema, no  acute joint swelling or redness no focal atrophy NEURO:  Oriented x3, cranial nerves 3-12 appear to be intact, no obvious focal weakness,gait within normal limits no abnormal reflexes or asymmetry SKIN: No acute rashes normal turgor, color, no bruising or petechiae. PSYCH: Oriented, good eye contact, no obvious depression anxiety, cognition and judgment appear normal. Pelvic: NL ext GU, labia clear without lesions or rash . Vagina no lesions .Cervix: clear  UTERUS: Neg CMT Adnexa:  clear no masses . PAP done declined rectal  EKG NSR        Assessment & Plan:  Preventive Health Care PAP today  Disc mammogram  HT  bettter suspect much of instability with bp in past was related to etoh use Pancreatitis    Last ct showed 2 smaller pseudocysts  Clinically  Better      GI appt had been canceled by Gi but has been unable to reschedule   Will hlep facilitate this to ensure compliance . rec she still follow up for now   Counseled at length   About etoh related health problems rec no etoh at all for now. Anemia Macrocytic    No bleeding  rechecl today  Has seen heme in the past    Perimenopausal  No intervention can use otc vag dryness preps

## 2011-02-22 ENCOUNTER — Encounter: Payer: Self-pay | Admitting: Internal Medicine

## 2011-02-22 DIAGNOSIS — K863 Pseudocyst of pancreas: Secondary | ICD-10-CM | POA: Insufficient documentation

## 2011-02-22 NOTE — Assessment & Plan Note (Signed)
Resolving pseudocyst x 2 still present on last ct .

## 2011-02-24 ENCOUNTER — Telehealth: Payer: Self-pay | Admitting: *Deleted

## 2011-02-24 DIAGNOSIS — D539 Nutritional anemia, unspecified: Secondary | ICD-10-CM

## 2011-02-24 DIAGNOSIS — K863 Pseudocyst of pancreas: Secondary | ICD-10-CM

## 2011-02-24 DIAGNOSIS — R634 Abnormal weight loss: Secondary | ICD-10-CM

## 2011-02-24 DIAGNOSIS — R1084 Generalized abdominal pain: Secondary | ICD-10-CM

## 2011-02-24 DIAGNOSIS — R933 Abnormal findings on diagnostic imaging of other parts of digestive tract: Secondary | ICD-10-CM

## 2011-02-24 NOTE — Telephone Encounter (Signed)
Left message on machine to call back  

## 2011-02-24 NOTE — Telephone Encounter (Signed)
Pt aware of results. Order sent to [facility] for GI referral.

## 2011-02-24 NOTE — Telephone Encounter (Signed)
Message copied by Tor Netters on Mon Feb 24, 2011 11:20 AM ------      Message from: Berniece Andreas K      Created: Sat Feb 22, 2011  6:49 AM       Pancreas= test much better but still slighty abnormal   Make sure you have a follow up with GI       Anemia got worse again   Unsure why .   Hg 9.8         Take multivitamin with iron and and extra iron pill per day ( if not bothering stomach)       Need FU anemia  Check in 2 months   .             Otherwise labs look great .Marland Kitchen

## 2011-02-26 ENCOUNTER — Encounter: Payer: Self-pay | Admitting: *Deleted

## 2011-03-24 ENCOUNTER — Ambulatory Visit: Payer: Self-pay | Admitting: Gastroenterology

## 2011-04-15 NOTE — Discharge Summary (Signed)
Nichole [name], Nichole [name]               ACCOUNT NO.:  [id number]   MEDICAL RECORD NO.:  [id number]          PATIENT TYPE:  INP   LOCATION:  3307                         FACILITY:  [facility]   PHYSICIAN:  Valerie A. Felicity Coyer, MDDATE OF BIRTH:  1961-10-16   DATE OF ADMISSION:  02/12/2008  DATE OF DISCHARGE:  02/13/2008                               DISCHARGE SUMMARY   DISCHARGE DIAGNOSES:  1. Seizure, presumed alcohol withdrawal, to complete outpatient workup      as below.  The patient instructed no driving for 6 months or until      cleared.  2. Alcohol abuse, ongoing.  3. Hypertension.  4. Hypokalemia, resolved.  5. Macrocytic anemia.  Discharge hemoglobin 9.1 with MCV of 118.      Outpatient follow-up.  6. Trauma to left side of face involving lips due to fall from #1.  No      lacerations, no fracture.   DISCHARGE MEDICATIONS:  1. Lisinopril.  2. Metoprolol 25 mg p.o. b.i.d.   HOSPITAL FOLLOW UP:  Tomorrow with primary care physician, Dr. Berniece Andreas.  The patient to call for further instructions to continue work  up with recommended MRI which she declines to do at this time.   DISPOSITION:  Hemodynamically stable.  Mentally and medically competent  with no clinical evidence of alcohol withdrawal symptoms or DTs.  Awake,  alert, oriented x4 and appropriate.  Understands risks and benefits of  departure from hospital prior to completion of workup as described below  and declines any further inpatient evaluation at this time.   HOSPITAL COURSE BY PROBLEM:  1. Seizure.  The patient is a 50 year old woman with longstanding      history of alcohol abuse who was brought to the emergency room by      EMS after losing consciousness and falling on her bathroom floor.      Her roommate found her on the floor, having hit her face with      apparent bruising, and due to confusion called EMS for evaluation.      She was transported to the [facility] emergency room where she was  witnessed to have a seizure which was treated with Ativan 2 mg.      The patient had no previous history of epilepsy or known seizure      disorder.  Head CT was performed and negative for any intracranial      abnormalities or bleed, and likewise negative for any facial or      skull fractures with trauma.  Friend reported heavy history of      alcohol abuse with no drinking in the past 24 hours prior to      admission, and thus she was admitted for evaluation and treatment      of presumed loss of consciousness due to seizure from alcohol      withdrawal.  However, overnight the patient has returned to her      normal mental status upon awakening post Ativan treatment.  I have      suggested to the patient the need for  MRI of the brain to exclude      other underlying intracranial abnormalities as a possible source of      her seizures, but she declines.  She reports she has personal      issues to attend to and cannot stay in the hospital any longer due      to these stressors.  I have explained the need for no driving due      to her seizure activity and need for her safety, as well as those      around her should a seizure occur while driving, and she agrees.      There is no indication or need for antiepileptic drugs at this      time, given only the first event, and much time was also spent      discussing with the patient her need for alcohol abstinence and      medical detox, which she also declines to do at this time.      Therefore, the patient is being allowed for discharge home to      follow up with her primary M.D. in the next 24 hours,  Dr. Fabian Sharp      will be called in the a.m. regarding this to hopefully arrange for      further workup and evaluation.  2. Macrocytic anemia.  There is no clinical evidence of bleeding, and      the patient was hemodynamically stable.  Suspect that this is due      to a long history of alcohol abuse, and outpatient follow up with       primary M.D. with possible GI referral would also be necessary.      Would also consider checking B12 levels and folate to exclude other      causes.  3. Hypertension.  The patient will continue her home medications      without change.  4. Hypokalemia.  This was replaced and normal at the time of discharge      at 3.5.      Valerie A. Felicity Coyer, MD  Electronically Signed     VAL/MEDQ  D:  02/13/2008  T:  02/13/2008  Job:  [id number]

## 2011-04-15 NOTE — Procedures (Signed)
EEG NUMBER:  [id number].   This is a 50 year old woman with an episode of seizure, was talking to a  friend who said that the patient then had staring episode, left the  room, returned, and was unable to form complete sentences.  When friend  tried to move her, she stated she had convulsions.   Medications listed:  None.   This was a sleep-deprived 17-channel EEG, with one channel devoted to  EKG, utilizing the International 10/20 lead placement system.  The  patient was described as awake and drowsy clinically and also  electrographically she appeared to be awake and drowsy.  She did not  achieve sleep.  While awake the background consisted of a well-  organized, well-developed, well-modulated 8-9 Hz alpha activity, which  is predominant in the posterior head region and reactive to eye opening.  No interhemispheric asymmetries identified and no definite epileptiform  discharges were seen.  Photic stimulation did produce occipital photic  driving and several flash frequencies in an appropriate fashion.  Hyperventilation produced no change in the background activity.  The EKG  monitor reveals relatively regular rhythm with a rate of about 78 beats  per minute with occasional ectopy noted.   CONCLUSION:  Normal awake and drowsy EEG without seizure-like activity  or focal abnormality seen during the course of today's recording.  Clinical correlation is recommended.      Catherine A. Orlin Hilding, M.D.  Electronically Signed     ZOX:WRUE  D:  04/05/2008 15:49:36  T:  04/06/2008 03:49:36  Job #:  [id number]

## 2011-04-15 NOTE — H&P (Signed)
Nichole [name], Nichole [name]               ACCOUNT NO.:  [id number]   MEDICAL RECORD NO.:  [id number]          PATIENT TYPE:  INP   LOCATION:  1823                         FACILITY:  [facility]   PHYSICIAN:  Donalynn Furlong, MD      DATE OF BIRTH:  Dec 19, 1960   DATE OF ADMISSION:  02/12/2008  DATE OF DISCHARGE:                              HISTORY & PHYSICAL   PRIMARY CARE Anaise Sterbenz:  Neta Mends. Panosh, M.D.   CHIEF COMPLAINT:  Seizure episode at home.   HISTORY OF THE PRESENTING ILLNESS:  The patient is a 50 year old African-  American female with a history of alcohol use.  She drinks alcohol  everyday including beer and hard liquor.  According to the history  obtained from the patient's friend, she states that the patient drinks  very heavily.  The patient is not in a state to give any history.  She  is confused and she has a bruise on her upper left lip.  According to  the friend the patient fell down in the bathroom hitting the baseboard.  She was having nausea and vomiting at that time and she was shaking  badly while trying to grab the handle of the door to open it.  She  called 9-1-1 and she was brought here.   The patient did have a possible seizure episode at home and another  seizure episode occurred here in the ER after receiving 2 mg of Ativan.  For the second seizure episode Ativan, 2 mg, was given again, and the  patient was stable after that.  The patient denies any history of  seizures.  There is no history of intracranial injury or history of  coronary artery disease in the past.   PAST MEDICAL HISTORY:  The past medical history is positive for high  blood pressure.   PAST SURGICAL HISTORY:  The past surgical history is unable to be  obtained as the patient is not able to give a history, and the patient's  friend does not know.   FAMILY HISTORY:  The family history is unable to be obtained because of  the patient's mental status and the patient is unable to give.   SOCIAL  HISTORY:  The patient social history is unable to be obtained  because of the patient's mental status and facial swelling.   ALLERGIES:  No known drug allergies according to the chart.   MEDICATIONS:  Lisinopril and metoprolol according to the chart.   REVIEW OF SYSTEMS:  The review of systems is unable to be obtained  because of the patient's mental status and facial swelling.   PHYSICAL EXAMINATION:  VITAL SIGNS:  Blood pressure 194/109, pulse 85,  respirations 16, temperature 97.6, and oxygen saturation 100% on room  air.  GENERAL APPEARANCE:  On general exam the patient is alert, confused and  not oriented.  HEENT:  Head; normocephalic and atraumatic.  Eyes; pupils are equally  round and react to light and accommodation.  Extraocular muscles are  intact.  Scleral icterus present.  Oral mucosa moist and no thrush is  noted.  Face; the  patient does have left upper lip and facial swelling  after her fall with mild tenderness.  NECK:  The neck reveals no JVD or thyromegaly.  HEART:  Cardiovascular - S1 and S2, irregular and tachycardiac.  No  murmur, rub or gallop.  LUNGS:  The lungs are clear to auscultation bilaterally.  No wheezing,  rhonchi or crackles.  ABDOMEN:  The abdomen is nontender and nondistended.  Bowel sounds are  present.  EXTREMITIES:  The extremities have no clubbing, cyanosis or edema.  Pulses are palpable in all four extremities.  SKIN:  The skin shows no rash or bruises, except for the facial  swelling.  NEUROLOGIC:  The neurological examination shows intact cranial nerves,  muscular strength, sensation and reflexes.   LABORATORY DATA:  Sodium 138, potassium 3, chloride 106, BUN 5, glucose  114, and bicarbonate 23.8.  Hematocrit 37 and hemoglobin 12.6.  Alcohol  level less than 5.  Creatinine 0.6.  Urine drug screen negative.  EKG  shows normal sinus rhythm with normal axis, and no ST-T changes.   ASSESSMENT AND PLAN:  1. Alcohol withdrawal seizure.  2.  Alcohol abuse and dependence.  3. History of hypertension.  4. Trauma to the face with left upper lip swelling.  5. Hypokalemia.   Plan:  1. We will admit the patient to Dr. Con Memos service in a      stepdown unit because of recurrent seizures today.  The patient's      condition is guarded at this time.  2. We will check amylase and lipase now.  3. We will check CBC, CMP, magnesium, phosphate, and EKG in the      morning as well as a chest x-ray.  4. We will start a banana bag with 1 liter of 5% dextrose with normal      saline, thiamine, folate, and magnesium; and, then we will change      her to normal saline with potassium supplement.  We will give her      by mouth potassium also.  5. We will continue Ativan for alcohol withdrawal along with as needed      Ativan.  6. We will keep the patient nothing per os, except for by mouth meds,      water and ice chips.  7. We will perform neuro check every 2 hours and we will get a      neurology consult also.  8. Further workup and the plan will be according to the lab work and      the investigations that are pending.  The patient had a CT of the      head done, which was unremarkable for any bleeding.  The patient      also had a CT of the face and lips done, which did not show any      fracture at this point.      Donalynn Furlong, MD  Electronically Signed     TVP/MEDQ  D:  02/12/2008  T:  02/13/2008  Job:  [id number]   cc:   Neta Mends. Fabian Sharp, MD

## 2011-04-15 NOTE — Letter (Signed)
January 02, 2009    Neta Mends. Fabian Sharp, MD  [address] [place], [place] [address]   RE:  Nichole [name], Nichole [name]  MRN:  [id number]  /  DOB:  1961/07/21   Dear Burna Mortimer:   It was a pleasure seeing Nichole [name] today at your request because of  tachy palpitations.   As you know, she is a 50 year old [place] American woman who works as a  Teacher, early years/pre in [place] who has a history of hypertension dating  back to 1991.  It was at that time that she first noted tachy  palpitations and what she describes is a almost daily recurrence of her  heart rate beating 110, 120 when she awakens from the bed in the morning  particularly on work days, but also sometimes on weekends.  She takes  her medication after she gets to work, these include her beta-blocker  and her ACE inhibitor.  Her symptoms sometimes abate thereafter and  sometimes not.  She has recorded her heart rate as high as 170, while  being active at work.  This is accompanied by some shortness of breath.  It is not infrequent for her to have heart rates in 110, 20s, and 30s  with modest activity.   She has a history of recurrent syncope.  While making soup last spring,  she recalls having awareness of presyncope.  She tried to stand and  complete her task.  She had falling to the floor.  She got up off the  floor and ended up almost passing out again.  About an hour later, she  felt good enough to go on about her business.   A number of months later, she had another episode this while moving the  furniture during the summer, she had a identical prodrome and so she  went inside.  She again had residual persistent orthostatic intolerance.  She tried to go from sitting to standing.  At some point, I think her  brother took blood pressure and it was recorded at 90/50.   This history is of note because she also has a history of a seizure  disorder that occurred after she got off the commode in the spring of  2009.  She actually fell  against the door and it was difficult to get  her out.  She was first aware of things after she came to on the sofa.  She apparently has EMS records, but it would be helpful to look at that  to see if she was hypotensive and 1 wonders whether this was convulsant  activity described was not part of a neurally mediated episode as  opposed to primary seizure disorder.   DISCUSSION:  In light of the neurological workup that was reportedly  negative.   She does not have any exercise intolerance apart from her tachy  palpitation.  She denies chest discomfort.  She does have longstanding  hypertension.   Nocturnal dyspnea, orthopnea, or peripheral edema are not concerns for  her.   Her past medical history in addition to above is notable for allergies  and anemia.   Her review of systems in addition to above is notable for discomfort in  her legs that she describes as a burning over her thighs and shins as  she walks particularly up hills.  She needs to stop actually after a  couple of minutes for the symptoms to abate.   SOCIAL HISTORY:  She is single.  She has no children.  She smokes a pack  per day and has for many years.  She does not use recreational drugs.  She does use some alcohol describing 2 beverages a day.   MEDICATIONS:  1. Lisinopril HCT 25/12.5.  2. Metoprolol 50 b.i.d.   She has no known drug allergies.   PHYSICAL EXAMINATION:  GENERAL:  She is a petite African American female  appearing her stated age of 71 and spells of tobacco.  VITAL SIGNS:  Her blood pressure was 156/84.  Her pulse was 84.  She was  returning to the supine position after examination.  Her blood pressure  is 139/82, sitting was 176/93 with a pulse of 88 standing and 0 minutes  was 180/90 with a pulse of 88 and 5 minutes of standing the blood  pressure falling to 140/88 with a pulse of 86.  HEENT:  Demonstrated no icterus or xanthoma.  NECK:  The neck veins were flat.  The carotids were brisk  and full  bilaterally without bruits.  BACK:  Without kyphosis or scoliosis.  LUNGS:  Clear.  HEART:  Sounds were regular with a loud S4.  ABDOMEN:  Soft with active bowel sounds without midline pulsation or  hepatomegaly.  Femoral pulses were 2+.  Distal pulses were not palpable.  There is no clubbing, cyanosis, or edema.  NEUROLOGICAL:  Grossly normal.  SKIN:  Warm and dry.   Electrocardiogram dated today demonstrated sinus rhythm at 77 with  intervals of 113/0.7/0.45.   IMPRESSION:  1. Syncope with persistent orthostasis and a recognizable prodrome      consistent with neurally mediated syndrome.  2. Seizure question related or separate diagnosis.  3. Tachy palpitations not documented associated with shortness of      breath and longstanding for many years.  4. Burning of the lower extremities with walking question claudication      with absent distal pulses.   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  Specifically, I wonder whether the  seizure that was seen was actually secondary to cerebral hypoperfusion  and a neurally mediated episode aggravated by going to the bathroom.  Orthostatic symptoms is not uncommon in patients who has systolic  hypertension as blood vessel compliance decreases.   The tachy palpitations that she describes are going to either be primary  cardiac which seems to in younger woman related to health care.  A 24 to  48-hour Holter monitor will hopefully elucidate the mechanism of her  tachycardia. I have also suggested that a 12-lead electrocardiogram  obtained at work while heart is racing would be very helpful.   In addition, given her complaints of poor exercise tolerance  relates to  her legs; this might actually be limiting her before her breath.  I have  taken the privilege of undertaking an echo as well as ABIs.  I will plan  to sit down with her again in 3 to 4 weeks' time to  review the  aforementioned testing.    Sincerely,      Duke Salvia, MD, [facility]  Electronically Signed    SCK/MedQ  DD: 01/02/2009  DT: 01/03/2009  Job #: [id number]

## 2011-04-15 NOTE — Assessment & Plan Note (Signed)
 HEALTHCARE                         ELECTROPHYSIOLOGY OFFICE NOTE   NAME:[name], [name]                      MRN:          [id number]  DATE:02/07/2009                            DOB:          January 11, 1961    Nichole [name] comes in today to follow up for Holter monitor for  palpitations.  She said these were not severe, but they were typical.  The Holter monitor demonstrated PACs, PVCs, and also modest increase in  heart rate when she gets to work which is about 9 o'clock and then her  heart rate runs about 100-120.  There have been various attempts to  adjust the metoprolol, lisinopril to benefit.  If the patient experience  that the lisinopril HCT actually has helped to help her with her  palpitations.   CURRENT MEDICATIONS:  1. Lisinopril HCT 25/12.5.  2. Metoprolol 50 t.i.d.   PHYSICAL EXAMINATION:  VITAL SIGNS:  Today, her blood pressure is  128/79, pulse was 80, weight was 110.  LUNGS:  Clear.  HEART:  Sounds were regular.  EXTREMITIES:  Without edema.   IMPRESSION:  1. 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  2. History of hypertension.  3. Premature atrial contractions and premature ventricular      contraction.   Nichole [name] and I discussed adjusting her medications as follows; we will  decrease the lisinopril to 1/2 pill a day, we will increase the  metoprolol to 75/50 and see if this does not help, and I have asked her  to try and take her beta-blocker when she gets up in the morning.  She  notes that if she takes it without eating, she gets some problems with  her bowels and that in the past by doubling her beta-blocker, she ended  up in hypertension hence decreasing her lisinopril.   We will plan to see her again at Dr. Rosezella Florida request.     Nichole Salvia, MD, [facility]  Electronically Signed    SCK/MedQ  DD: 02/07/2009  DT: 02/07/2009  Job #: [id number]

## 2011-04-16 ENCOUNTER — Ambulatory Visit: Payer: Self-pay | Admitting: Gastroenterology

## 2011-05-26 ENCOUNTER — Ambulatory Visit: Payer: Self-pay | Admitting: Gastroenterology

## 2011-08-25 ENCOUNTER — Encounter: Payer: Self-pay | Admitting: Internal Medicine

## 2011-08-25 ENCOUNTER — Ambulatory Visit (INDEPENDENT_AMBULATORY_CARE_PROVIDER_SITE_OTHER): Payer: Self-pay | Admitting: Internal Medicine

## 2011-08-25 VITALS — BP 140/90 | HR 78 | Wt 147.0 lb

## 2011-08-25 DIAGNOSIS — N959 Unspecified menopausal and perimenopausal disorder: Secondary | ICD-10-CM

## 2011-08-25 DIAGNOSIS — I10 Essential (primary) hypertension: Secondary | ICD-10-CM

## 2011-08-25 DIAGNOSIS — K863 Pseudocyst of pancreas: Secondary | ICD-10-CM

## 2011-08-25 DIAGNOSIS — D539 Nutritional anemia, unspecified: Secondary | ICD-10-CM

## 2011-08-25 DIAGNOSIS — K862 Cyst of pancreas: Secondary | ICD-10-CM

## 2011-08-25 DIAGNOSIS — F17200 Nicotine dependence, unspecified, uncomplicated: Secondary | ICD-10-CM

## 2011-08-25 LAB — CBC WITH DIFFERENTIAL/PLATELET
Basophils Absolute: 0 10*3/uL (ref 0.0–0.1)
Eosinophils Absolute: 0.1 10*3/uL (ref 0.0–0.7)
HCT: 37.3 % (ref 36.0–46.0)
Lymphs Abs: 1.5 10*3/uL (ref 0.7–4.0)
MCHC: 32.3 g/dL (ref 30.0–36.0)
MCV: 103.4 fl — ABNORMAL HIGH (ref 78.0–100.0)
Monocytes Absolute: 0.3 10*3/uL (ref 0.1–1.0)
Neutrophils Relative %: 61.1 % (ref 43.0–77.0)
Platelets: 203 10*3/uL (ref 150.0–400.0)
RDW: 15.7 % — ABNORMAL HIGH (ref 11.5–14.6)

## 2011-08-25 LAB — CBC
HCT: 27.4 — ABNORMAL LOW
Hemoglobin: 9.1 — ABNORMAL LOW
MCHC: 33.3
RDW: 18 — ABNORMAL HIGH

## 2011-08-25 LAB — RAPID URINE DRUG SCREEN, HOSP PERFORMED
Amphetamines: NOT DETECTED
Barbiturates: NOT DETECTED
Opiates: NOT DETECTED

## 2011-08-25 LAB — I-STAT 8, (EC8 V) (CONVERTED LAB)
BUN: 5 — ABNORMAL LOW
Bicarbonate: 23.8
Chloride: 106
HCT: 37
Hemoglobin: 12.6
Operator id: 151321
Potassium: 3 — ABNORMAL LOW
Sodium: 138

## 2011-08-25 LAB — COMPREHENSIVE METABOLIC PANEL
Alkaline Phosphatase: 51
BUN: 2 — ABNORMAL LOW
Calcium: 7.6 — ABNORMAL LOW
GFR calc non Af Amer: >60
Glucose, Bld: 138 — ABNORMAL HIGH
Total Protein: 5.4 — ABNORMAL LOW

## 2011-08-25 LAB — LIPASE, BLOOD: Lipase: 21

## 2011-08-25 LAB — MAGNESIUM: Magnesium: 2.6 — ABNORMAL HIGH

## 2011-08-25 LAB — POCT I-STAT CREATININE: Creatinine, Ser: 0.6

## 2011-08-25 LAB — TSH: TSH: 0.893

## 2011-08-25 NOTE — Progress Notes (Signed)
  Subjective:    Patient ID: Nichole [name], female    DOB: 1961/02/26, 50 y.o.   MRN: [id number]  HPI Comes in for follow up of  multiple medical issues Since her last visit: No etoh but still some tobacco decreasing concern that she has gained weight in the meatime. No NVD abd pain  hasnt fu with GI cause of financial issues Hypertension:   Stopped  All meds cause bp has been low 117/ 106 and 120  When taken at [facility] pulse tends to run 80- 11o no paolitations    Had episode of chest pressure pain.  No associated sx and went.   to back  Not associated 1 month ago.  sx. amonth ago. No re Poor sleep   .   Difficulty since childhood.    Review of Systems No fever bleeding  Falling  Has some pigment change  After bug bites.  Says one leg has more muscle mass than another no specific weakness   Past history family history social history reviewed in the electronic medical record. Still unemployed looking for work.    Has to re[place] [facility] again has no help at prsent      Objective:   Physical Exam BP 145/90 Left  138/90 WDWN in nad Nl affect Neck no masses Chest:  Clear to A&P without wheezes rales or rhonchi CV:  S1-S2 no gallops or murmurs peripheral perfusion is normal Abdomen:  Sof,t normal bowel sounds without hepatosplenomegaly, no guarding rebound or masses no CVA tenderness No clubbing cyanosis or edema      Assessment & Plan:    HT  Better   Per pat readings off meds but up by me ( ? [facility])  Suspect prev etoh had contributed but still may benefit from meds. Metoprolol would be good with hx of elevated pulse.    Disc this today  Anemia macrocytic combines   Last time 9.8 will repeat today Pancreatitis and cyst  No fu caus of financial  But no sx at present to remain etoh free Tobacco   rec stop as always.  REmote hx of chest pain episode   Disc and if recurrs seek emergent care.

## 2011-08-25 NOTE — Patient Instructions (Signed)
Will notify you  of labs when available. Make sure blood pressure readings are below 140/90  If so restart the metoprolol and call  Seek care if chest pain recurs.  Continue alcohol free. Plan follow up depending on lab results.

## 2011-08-26 ENCOUNTER — Telehealth: Payer: Self-pay | Admitting: *Deleted

## 2011-08-26 NOTE — Telephone Encounter (Signed)
Left message to call back  

## 2011-08-26 NOTE — Telephone Encounter (Signed)
Message copied by Romualdo Bolk on Tue Aug 26, 2011  3:42 PM ------      Message from: [facility], [place] [facility]      Created: Tue Aug 26, 2011  1:15 PM       Tell pt that her anemia is much better . Cell size is still somewhat enlarged but almost normal now !      Monitor your BP readings as we discussed .return office visit in 6 months or as needed

## 2011-08-26 NOTE — Telephone Encounter (Signed)
Pt aware of results 

## 2012-02-23 ENCOUNTER — Ambulatory Visit (INDEPENDENT_AMBULATORY_CARE_PROVIDER_SITE_OTHER): Payer: Self-pay | Admitting: Internal Medicine

## 2012-02-23 ENCOUNTER — Encounter: Payer: Self-pay | Admitting: Internal Medicine

## 2012-02-23 VITALS — BP 136/88 | HR 80 | Temp 98.4°F | Wt 140.0 lb

## 2012-02-23 DIAGNOSIS — M79609 Pain in unspecified limb: Secondary | ICD-10-CM

## 2012-02-23 DIAGNOSIS — D539 Nutritional anemia, unspecified: Secondary | ICD-10-CM

## 2012-02-23 DIAGNOSIS — M79605 Pain in left leg: Secondary | ICD-10-CM

## 2012-02-23 DIAGNOSIS — D649 Anemia, unspecified: Secondary | ICD-10-CM

## 2012-02-23 DIAGNOSIS — F17200 Nicotine dependence, unspecified, uncomplicated: Secondary | ICD-10-CM

## 2012-02-23 DIAGNOSIS — I10 Essential (primary) hypertension: Secondary | ICD-10-CM

## 2012-02-23 LAB — POCT HEMOGLOBIN: Hemoglobin: 12.1 g/dL — AB (ref 12.2–16.2)

## 2012-02-23 MED ORDER — AMLODIPINE BESYLATE 5 MG PO TABS: 5.0000 mg | ORAL_TABLET | Freq: Every day | ORAL | Status: DC

## 2012-02-23 MED ORDER — METOPROLOL TARTRATE 50 MG PO TABS: 25.0000 mg | ORAL_TABLET | Freq: Two times a day (BID) | ORAL | Status: DC

## 2012-02-23 NOTE — Progress Notes (Signed)
  Subjective:    Patient ID: Nichole [name], female    DOB: Jun 04, 1961, 51 y.o.   MRN: [id number]  HPI Pt comesin for fu a number of issues. HT doing ok on amlodipine and metoprolol  No etoh  No abd pain has gained some weight. Tobacco still some 4 per day no cp sob. Anemia : no bleeding or  Syncope.    Review of Systems No fever cp sob  Left foot still feels cold in the foot  Last abi 2010 and nl   With sAME SX . NO falling or prgression  No seizures   Past history family history social history reviewed in the electronic medical record.     Objective:   Physical Exam WDWN in nad   Look well  Chest:  Clear to A&P without wheezes rales or rhonchi CV:  S1-S2 no gallops or murmurs peripheral perfusion is normal Abdomen:  Sof,t normal bowel sounds without hepatosplenomegaly, no guarding rebound or masses no CVA tenderness Repeat BP 140/88  Lab Results  Component Value Date   HGB 12.1* 02/23/2012    Wt Readings from Last 3 Encounters:  02/23/12 140 lb (63.504 kg)  08/25/11 147 lb (66.679 kg)  02/21/11 125 lb (56.7 kg)       Assessment & Plan:  HT better off etoh still high nl had nuasea with amlodipine Left leg pain foot Nl abi in 2010 ? Cause  ? Radiation back  No weakness  Anemia better last time no bleeding  Recheck hg today  Better  Tobacco  Advise cessation  Plan rov in 6 months with labs at that time.

## 2012-02-23 NOTE — Patient Instructions (Addendum)
You are not  Anemic today Continue same meds and  ROV in 6 months and may be due for  Labs at that point.

## 2012-02-24 ENCOUNTER — Encounter: Payer: Self-pay | Admitting: Internal Medicine

## 2012-05-13 ENCOUNTER — Encounter: Payer: Self-pay | Admitting: Internal Medicine

## 2012-05-13 ENCOUNTER — Ambulatory Visit (INDEPENDENT_AMBULATORY_CARE_PROVIDER_SITE_OTHER): Payer: Self-pay | Admitting: Internal Medicine

## 2012-05-13 ENCOUNTER — Ambulatory Visit: Payer: Self-pay | Admitting: Internal Medicine

## 2012-05-13 VITALS — BP 150/100 | HR 92 | Temp 98.3°F | Wt 138.0 lb

## 2012-05-13 DIAGNOSIS — M79606 Pain in leg, unspecified: Secondary | ICD-10-CM

## 2012-05-13 DIAGNOSIS — I10 Essential (primary) hypertension: Secondary | ICD-10-CM

## 2012-05-13 DIAGNOSIS — M79609 Pain in unspecified limb: Secondary | ICD-10-CM

## 2012-05-13 DIAGNOSIS — R609 Edema, unspecified: Secondary | ICD-10-CM

## 2012-05-13 MED ORDER — DOXYCYCLINE HYCLATE 100 MG PO CAPS: 100.0000 mg | ORAL_CAPSULE | Freq: Two times a day (BID) | ORAL | Status: AC

## 2012-05-13 MED ORDER — FUROSEMIDE 20 MG PO TABS: 20.0000 mg | ORAL_TABLET | Freq: Every day | ORAL | Status: DC

## 2012-05-13 NOTE — Progress Notes (Signed)
  Subjective:    Patient ID: Nichole [name], female    DOB: 07/13/1961, 51 y.o.   MRN: [id number]  HPI  Patient comes in today for SDA for  new problem evaluation. Both legs are red and swollen. She states to have had swelling before but now they're very tender to touch on the front. It doesn't hurt to walk or move her feet just to feel the front of her shins. Denies any trauma. She hasn't been taking her blood pressure medicine irregularly other did take her amlodipine today. She has been eating a very high salt diet with [place] food and stoops over the last number of weeks. Denies new chest pain shortness of breath He states that her legs are red and warm. She's had swelling before but not the redness.  Review of Systems Negative for new chest pain shortness of breath has occasional hot flashes last period was a few years ago wonders of above could be from hormones    Objective:   Physical Exam BP 150/100  Pulse 92  Temp 98.3 F (36.8 C) (Oral)  Wt 138 lb (62.596 kg)  SpO2 95% Well-developed well-nourished in no acute distress Feet look normal she has +1 pretibial edema with some faint erythema that is diffuse bilaterally and symmetrical. There is no calf tenderness. She's very tender when you press along the area around the tibia bilaterally but not bony tenderness. She has good range of motion knee and ankle. Pulses are intact.     Assessment & Plan:  Leg edema Leg pains bilateral this is new with some mild redness that is symmetrical.  Hypertension not in control today she states not taking her medicine regularly.  It isn't clear why she is having this problem although it looks like changes related to edema with skin color changes. It doesn't look like a classic cellulitis infection that would have to be bilateral but we will give her an antibiotic today and case elevate her legs try a week or less of Lasix to see if this gets better have her decrease the high sodium .   Followup  in a few weeks if she's  not much better. Contact us for alarm features  Her legs do not look like a venous clotting. It would have to be bilateral in anterior as far as the discomfort goes. It is possible the amlodipine could cause some swelling but she has been on this for a while. The onset was after working in the garden and perhaps with the heat and high salt she is having the symptoms. Her last TSH was about a year ago may need this repeated. Although I do not think she is other signs of thyroid disease.

## 2012-05-13 NOTE — Patient Instructions (Addendum)
NORVASC  Can cause swelling    But not the pain  The pain could be from acute swelling and redness from this too.  If you are getting   A low grade infection cause of the swelling.  Elevate  legs when possible .   Must cut out the salt  To decrease swelling and  Help BP also . And perhaps the pain.  Take the Lasix once a day for a week. Can add the antibiotic if the redness is getting worse.   Edema Edema is an abnormal build-up of fluids in tissues. Because this is partly dependent on gravity (water flows to the lowest place), it is more common in the leg sand thighs (lower extremities). It is also common in the looser tissues, like around the eyes. Painless swelling of the feet and ankles is common and increases as a person ages. It may affect both legs and may include the calves or even thighs. When squeezed, the fluid may move out of the affected area and may leave a dent for a few moments. CAUSES   Prolonged standing or sitting in one place for extended periods of time. Movement helps pump tissue fluid into the veins, and absence of movement prevents this, resulting in edema.   Varicose veins. The valves in the veins do not work as well as they should. This causes fluid to leak into the tissues.   Fluid and salt overload.   Injury, burn, or surgery to the leg, ankle, or foot, may damage veins and allow fluid to leak out.   Sunburn damages vessels. Leaky vessels allow fluid to go out into the sunburned tissues.   Allergies (from insect bites or stings, medications or chemicals) cause swelling by allowing vessels to become leaky.   Protein in the blood helps keep fluid in your vessels. Low protein, as in malnutrition, allows fluid to leak out.   Hormonal changes, including pregnancy and menstruation, cause fluid retention. This fluid may leak out of vessels and cause edema.   Medications that cause fluid retention. Examples are sex hormones, blood pressure medications, steroid  treatment, or anti-depressants.   Some illnesses cause edema, especially heart failure, kidney disease, or liver disease.   Surgery that cuts veins or lymph nodes, such as surgery done for the heart or for breast cancer, may result in edema.  DIAGNOSIS  Your caregiver is usually easily able to determine what is causing your swelling (edema) by simply asking what is wrong (getting a history) and examining you (doing a physical). Sometimes x-rays, EKG (electrocardiogram or heart tracing), and blood work may be done to evaluate for underlying medical illness. TREATMENT  General treatment includes:  Leg elevation (or elevation of the affected body part).   Restriction of fluid intake.   Prevention of fluid overload.   Compression of the affected body part. Compression with elastic bandages or support stockings squeezes the tissues, preventing fluid from entering and forcing it back into the blood vessels.   Diuretics (also called water pills or fluid pills) pull fluid out of your body in the form of increased urination. These are effective in reducing the swelling, but can have side effects and must be used only under your caregiver's supervision. Diuretics are appropriate only for some types of edema.  The specific treatment can be directed at any underlying causes discovered. Heart, liver, or kidney disease should be treated appropriately. HOME CARE INSTRUCTIONS   Elevate the legs (or affected body part) above the level of  the heart, while lying down.   Avoid sitting or standing still for prolonged periods of time.   Avoid putting anything directly under the knees when lying down, and do not wear constricting clothing or garters on the upper legs.   Exercising the legs causes the fluid to work back into the veins and lymphatic channels. This may help the swelling go down.   The pressure applied by elastic bandages or support stockings can help reduce ankle swelling.   A low-salt diet  may help reduce fluid retention and decrease the ankle swelling.   Take any medications exactly as prescribed.  SEEK MEDICAL CARE IF:  Your edema is not responding to recommended treatments. SEEK IMMEDIATE MEDICAL CARE IF:   You develop shortness of breath or chest pain.   You cannot breathe when you lay down; or if, while lying down, you have to get up and go to the window to get your breath.   You are having increasing swelling without relief from treatment.   You develop a fever over 102 F (38.9 C).   You develop pain or redness in the areas that are swollen.   Tell your caregiver right away if you have gained 3 lb/1.4 kg in 1 day or 5 lb/2.3 kg in a week.  MAKE SURE YOU:   Understand these instructions.   Will watch your condition.   Will get help right away if you are not doing well or get worse.  Document Released: 11/17/2005 Document Revised: 11/06/2011 Document Reviewed: 07/05/2008 [facility] Patient Information 2012 [place], [place].

## 2012-06-29 ENCOUNTER — Ambulatory Visit: Payer: Self-pay | Admitting: Internal Medicine

## 2012-06-29 ENCOUNTER — Telehealth: Payer: Self-pay | Admitting: Internal Medicine

## 2012-06-29 NOTE — Telephone Encounter (Signed)
Caller: Mark/Patient; PCP: [name].; CB#: [phone]; ; ; Call regarding Peeling of Hands and Feet;   LMP- Post menopausal. Patient states she developed peeling of skin on hands, onset 05/31/12. States lasted approx. 2 weeks and resolved. States also developed peeling of soles of feet, onset 06/19/12. States skin is peeling off of soles of feet in sheets. No redness, swelling, red streaks or drainage. No blisters or lesions. Denies pain. Denies itching. No rashes. No discoloration. Afebrile. Triage per Skin Lesions Protocol. Emergent sx of Large areas of peeling skin.  No appts. available within 1 hour for 06/29/12. RN spoke with [facility] in office, informed of above. Appt. scheduled for 06/29/12 1415 with Dr. Berniece Andreas. Patient informed of above. Call back parameters reviewed. Patient verbalizes understanding.

## 2012-07-02 ENCOUNTER — Ambulatory Visit (INDEPENDENT_AMBULATORY_CARE_PROVIDER_SITE_OTHER): Payer: Self-pay | Admitting: Internal Medicine

## 2012-07-02 ENCOUNTER — Encounter: Payer: Self-pay | Admitting: Internal Medicine

## 2012-07-02 VITALS — BP 110/80 | Temp 98.8°F | Wt 137.0 lb

## 2012-07-02 DIAGNOSIS — R234 Changes in skin texture: Secondary | ICD-10-CM

## 2012-07-02 DIAGNOSIS — I10 Essential (primary) hypertension: Secondary | ICD-10-CM

## 2012-07-02 DIAGNOSIS — L988 Other specified disorders of the skin and subcutaneous tissue: Secondary | ICD-10-CM

## 2012-07-02 NOTE — Progress Notes (Signed)
  Subjective:    Patient ID: Nichole [name], female    DOB: Sep 13, 1961, 51 y.o.   MRN: [id number]  HPI Patient comes in today for for  new problem evaluation. A week or so ago she had peeling of her palms without any pain or rash she peeled it off and it got better this week she noticed large areas of skin peeling on the bottom of her feet not associated with symptoms. Unsure what she should do about it no new chemicals or infections.  She's no longer on the Lasix. The swelling in her legs his last but still has some mild redness. She's no longer on the Lasix she has limited or stopped the [place] food with high salt content.  She still gets numbness or tingling in her feet when she sits a long time but not as much when she's walking around. She's trying to eat healthy she felt bad the other day and ate something checked her blood pressure and it was in the 120 range. This is even though going down on her blood pressure medication. Review of Systems  No fever sot other as above   Past history family history social history reviewed in the electronic medical record.  Outpatient Encounter Prescriptions as of 07/02/2012  Medication Sig Dispense Refill  . amLODipine (NORVASC) 5 MG tablet Take 1 tablet (5 mg total) by mouth daily.  30 tablet  6  . metoprolol (LOPRESSOR) 50 MG tablet Take 0.5 tablets (25 mg total) by mouth 2 (two) times daily. Take 1/2 tablet twice a day  30 tablet  6  . furosemide (LASIX) 20 MG tablet Take 1 tablet (20 mg total) by mouth daily.  14 tablet  1      Objective:   Physical Exam  BP 110/80  Temp 98.8 F (37.1 C) (Oral)  Wt 137 lb (62.143 kg) Examination of her hands are clear examination of the feet shows some sheets of skin getting ready to peel but no blistering interdigital rash CV or redness or dermatitis. Pulses are present. Gross sensation is intact. Gait is normal. Slight puffiness to legs and mild erythema   She looks well today in no acute distress     Assessment & Plan:   Hypertension controlled  lifestyle interventions Peeling hands and feet  uncertain cause  no history of strep infection She is due for laboratory studies and will be having a checkup and we'll do labs in the fall Edema is better it's possible she is getting minor side effect of the Norvasc was continued blood pressure control we can minimize medication. Consider  Support stockling  Avoid prolonged sitting. History of tingling off and on in her feet I do not think this is circulatory at this time could have had nutritional deficiencies when she was using more alcohol will followup with lab or in next month. No diagnosis of diabetes

## 2012-07-02 NOTE — Patient Instructions (Signed)
  I think the peeling is not serious and eventually will go away .  Sometimes it is a reaction to an infection that has since resolved  Weeks before.  Continue lifestyle intervention healthy eating and exercise . And will get labs and check up in the fall.

## 2012-07-05 ENCOUNTER — Ambulatory Visit: Payer: Self-pay | Admitting: Internal Medicine

## 2012-08-26 ENCOUNTER — Ambulatory Visit (INDEPENDENT_AMBULATORY_CARE_PROVIDER_SITE_OTHER): Payer: Self-pay | Admitting: Internal Medicine

## 2012-08-26 ENCOUNTER — Encounter: Payer: Self-pay | Admitting: Internal Medicine

## 2012-08-26 VITALS — BP 118/78 | HR 103 | Temp 98.6°F | Wt 138.0 lb

## 2012-08-26 DIAGNOSIS — F17200 Nicotine dependence, unspecified, uncomplicated: Secondary | ICD-10-CM

## 2012-08-26 DIAGNOSIS — G629 Polyneuropathy, unspecified: Secondary | ICD-10-CM | POA: Insufficient documentation

## 2012-08-26 DIAGNOSIS — D649 Anemia, unspecified: Secondary | ICD-10-CM

## 2012-08-26 DIAGNOSIS — I10 Essential (primary) hypertension: Secondary | ICD-10-CM

## 2012-08-26 DIAGNOSIS — G589 Mononeuropathy, unspecified: Secondary | ICD-10-CM

## 2012-08-26 DIAGNOSIS — R209 Unspecified disturbances of skin sensation: Secondary | ICD-10-CM

## 2012-08-26 DIAGNOSIS — R200 Anesthesia of skin: Secondary | ICD-10-CM | POA: Insufficient documentation

## 2012-08-26 LAB — BASIC METABOLIC PANEL
BUN: 18 mg/dL (ref 6–23)
CO2: 23 mEq/L (ref 19–32)
Calcium: 8.7 mg/dL (ref 8.4–10.5)
Creatinine, Ser: 0.7 mg/dL (ref 0.4–1.2)

## 2012-08-26 LAB — LIPID PANEL
HDL: 103.5 mg/dL (ref >39.00)
LDL Cholesterol: 35 mg/dL (ref 0–99)
Total CHOL/HDL Ratio: 2
Triglycerides: 137 mg/dL (ref 0.0–149.0)
VLDL: 27.4 mg/dL (ref 0.0–40.0)

## 2012-08-26 LAB — TSH: TSH: 1.35 u[IU]/mL (ref 0.35–5.50)

## 2012-08-26 LAB — CBC WITH DIFFERENTIAL/PLATELET
Basophils Absolute: 0 10*3/uL (ref 0.0–0.1)
Basophils Relative: 0.6 % (ref 0.0–3.0)
Eosinophils Absolute: 0.1 10*3/uL (ref 0.0–0.7)
Lymphocytes Relative: 23.7 % (ref 12.0–46.0)
MCHC: 32.5 g/dL (ref 30.0–36.0)
Monocytes Absolute: 0.3 10*3/uL (ref 0.1–1.0)
Neutrophils Relative %: 69.6 % (ref 43.0–77.0)
Platelets: 229 10*3/uL (ref 150.0–400.0)
RBC: 2.75 Mil/uL — ABNORMAL LOW (ref 3.87–5.11)

## 2012-08-26 LAB — HEPATIC FUNCTION PANEL
Alkaline Phosphatase: 81 U/L (ref 39–117)
Bilirubin, Direct: 0.1 mg/dL (ref 0.0–0.3)
Total Bilirubin: 0.5 mg/dL (ref 0.3–1.2)

## 2012-08-26 NOTE — Progress Notes (Signed)
  Subjective:    Patient ID: Nichole [name], female    DOB: [birth year], 50 y.o.   MRN: [id number]  HPI Patient comes in today for follow up of  multiple medical problems.  BP has been taking medication and blood pressure 128 at home. Tobacco about a half a pack a day SKIN: no more peeling Tingling numbness below kness off and on  Getting worse?  Also gets tingling in hands at times ? Circulation or other .  / Has had times where she felt tired and lightheaded but that was after not needing and then got better after eating some candy. He was also after she was outside for a while   Review of Systems No bleeding periods gi gu changes  New joint problems.  No unusual bleeding chest pain shortness of breath cough. No dominant pain or vomiting  Past history family history social history reviewed in the electronic medical record. Alcohol free Outpatient Encounter Prescriptions as of 08/26/2012  Medication Sig Dispense Refill  . amLODipine (NORVASC) 5 MG tablet Take 1 tablet (5 mg total) by mouth daily.  30 tablet  6  . metoprolol (LOPRESSOR) 50 MG tablet Take 0.5 tablets (25 mg total) by mouth 2 (two) times daily. Take 1/2 tablet twice a day  30 tablet  6       Objective:   Physical Exam BP 118/78  Pulse 103  Temp 98.6 F (37 C) (Oral)  Wt 138 lb (62.596 kg) wdwn in nad Neck: Supple without adenopathy or masses or bruits Chest:  Clear to A&P without wheezes rales or rhonchi CV:  S1-S2 no gallops or murmurs peripheral perfusion is normal Abdomen:  Sof,t normal bowel sounds without hepatosplenomegaly, no guarding rebound or masses no CVA tenderness No clubbing cyanosis or edema Color of toes are a little bit pink sensation intact but decreased microfilament on toes and balls of feet no ulcers or unusual calluses. No acute deformities,       Assessment & Plan:   HT  better control she can check this off and on to make sure that it is not too low or having orthostatic changes making  her feel badly at this time we could back off on the dosing of the medication if needed. Some of her symptoms may be from not eating for long period of time we'll check hemoglobin A1c and blood sugar today. Tobacco advised discontinuation Numbness  Comes and goes still suspect some type of neuropathy  Stay etoh free als o I suppose b blocker could cause vasospasm but no sx of such  And sx better when  At times not with walking and no claudication .  Advised her to stay off the alcohol as she is doing as this can cause neuropathy. We do neurology referral when appropriate and she can afford it she has no insurance at this time

## 2012-08-26 NOTE — Patient Instructions (Signed)
Your blood pressure is good today. Still make efforts to stop smoking to help circulation and health.  Will inform you of labs when they are available. The hand tingling may be something like a carpal tunnel when you sleep at night and may be positional. However it the tingling and numbness in your feet could be a neuropathy. Consider neurology reassessment for these symptoms. We'll check B12 level and diabetes check today make sure that it's not causing her numbness.  Depending on your labs followup in 6 months.

## 2012-09-03 ENCOUNTER — Telehealth: Payer: Self-pay | Admitting: Family Medicine

## 2012-09-03 NOTE — Telephone Encounter (Signed)
Just wanted to notify you that the pt declined b12 injections, folic acid rx and lab work until she comes in for an appt to discuss this.  She says she has been through this before and does not understand.  B12 injections have not helped in the past.  I explained folic acid and it's importance.  She refused to come in unless it was an early morning appt (before 9am).  I have her scheduled for 10/01/12 @ 8:30.  Please advise any further directions.

## 2012-09-05 NOTE — Telephone Encounter (Signed)
Noted patient wishes. Will discuss at her OV.

## 2012-10-01 ENCOUNTER — Ambulatory Visit (INDEPENDENT_AMBULATORY_CARE_PROVIDER_SITE_OTHER): Payer: Self-pay | Admitting: Internal Medicine

## 2012-10-01 ENCOUNTER — Encounter: Payer: Self-pay | Admitting: Internal Medicine

## 2012-10-01 VITALS — BP 130/76 | HR 89 | Temp 98.6°F | Wt 138.0 lb

## 2012-10-01 DIAGNOSIS — D539 Nutritional anemia, unspecified: Secondary | ICD-10-CM

## 2012-10-01 DIAGNOSIS — D518 Other vitamin B12 deficiency anemias: Secondary | ICD-10-CM

## 2012-10-01 DIAGNOSIS — R7989 Other specified abnormal findings of blood chemistry: Secondary | ICD-10-CM

## 2012-10-01 DIAGNOSIS — I10 Essential (primary) hypertension: Secondary | ICD-10-CM

## 2012-10-01 DIAGNOSIS — R200 Anesthesia of skin: Secondary | ICD-10-CM

## 2012-10-01 DIAGNOSIS — R209 Unspecified disturbances of skin sensation: Secondary | ICD-10-CM

## 2012-10-01 LAB — CBC WITH DIFFERENTIAL/PLATELET
Basophils Relative: 0.2 % (ref 0.0–3.0)
Eosinophils Absolute: 0.1 10*3/uL (ref 0.0–0.7)
Eosinophils Relative: 2.6 % (ref 0.0–5.0)
Hemoglobin: 9.7 g/dL — ABNORMAL LOW (ref 12.0–15.0)
MCHC: 33 g/dL (ref 30.0–36.0)
MCV: 120.5 fl — ABNORMAL HIGH (ref 78.0–100.0)
Monocytes Absolute: 0.3 10*3/uL (ref 0.1–1.0)
Neutro Abs: 4.1 10*3/uL (ref 1.4–7.7)
Neutrophils Relative %: 73.9 % (ref 43.0–77.0)
RBC: 2.44 Mil/uL — ABNORMAL LOW (ref 3.87–5.11)
WBC: 5.6 10*3/uL (ref 4.5–10.5)

## 2012-10-01 LAB — FERRITIN: Ferritin: 305.3 ng/mL — ABNORMAL HIGH (ref 10.0–291.0)

## 2012-10-01 LAB — IBC PANEL
Saturation Ratios: 16.5 % — ABNORMAL LOW (ref 20.0–50.0)
Transferrin: 225.4 mg/dL (ref 212.0–360.0)

## 2012-10-01 MED ORDER — FOLIC ACID 1 MG PO TABS: 1.0000 mg | ORAL_TABLET | Freq: Every day | ORAL | Status: DC

## 2012-10-01 MED ORDER — CYANOCOBALAMIN 1000 MCG/ML IJ SOLN
1000.0000 ug | Freq: Once | INTRAMUSCULAR | Status: AC
  Administered 2012-10-01: 1000 ug via INTRAMUSCULAR

## 2012-10-01 NOTE — Progress Notes (Signed)
Chief Complaint  Patient presents with  . Follow-up    Labs    HPI: Patient comes in today for followup of her blood tests. Her anemia has returned with increased MCV and abnormal blood count. See results note. In the past she was on B12 oral and shots that we had begun. Since she has been alcohol free and after her pancreatitis she is apparently not been on B12 supplements. She denies any bleeding doesn't sleep really well but has some fatigue and there has always been something wrong with her legs that has been previously evaluated. She has no falling or change in vision.  She is just started working for [employer] 6 days a week sometimes up to 10 hour shifts. She is very tired in her feet bother her by the end of the day. Says she is not eating very well food wise.  ROS: See pertinent positives and negatives per HPI. No chest pain shortness of breath current syncope she has intermittent loose stools when she eats sandwiches at work had normal bowel movements at home denies alcohol at this time.  Past Medical History  Diagnosis Date  . ANEMIA, B12 DEFICIENCY 04/05/2009  . 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 TOBACCO USE 01/25/2008  . 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 HYPERTENSION 01/29/2008  . EXCESSIVE MENSTRUAL BLEEDING 03/18/2010    gyne eval and bx neg  . PERIMENOPAUSAL SYNDROME 08/23/2009  . LEG PAIN, BILATERAL 06/22/2008  . SYNCOPE 01/02/2009  . SWEATING 09/23/2010  . Palpitations 01/02/2009  . THYROID FUNCTION TEST, ABNORMAL 03/19/2009  . Acute pancreatitis 12/05/2010    felt etoh related   . WEIGHT LOSS-ABNORMAL 12/05/2010  . GASTROINTESTINAL XRAY, ABNORMAL 12/16/2010  . RASH 12/23/2010    Family History  Problem Relation Age of Onset  . Hypertension Mother   . Kidney disease Mother     removed for cyst  . Other Mother     overweight  . Heart attack Father   . Stroke Father     died 70  . Hypertension Brother   . Neuromuscular disorder     spinomuscular atrophy    History   Social History  . Marital Status: Single    Spouse Name: N/A    Number of Children: N/A  . Years of Education: N/A   Social History Main Topics  . Smoking status: Current Every Day Smoker    Types: Cigarettes  . Smokeless tobacco: Never Used   Comment: less than 1/2 pack  . Alcohol Use: No  . Drug Use: No  . Sexually Active: None   Other Topics Concern  . None   Social History Narrative   No InsuranceOn MCHS ProgramUnemployed  Used to work dialysis Smoker trying to cut downOff etoh recently      Current outpatient prescriptions:amLODipine (NORVASC) 5 MG tablet, Take 1 tablet (5 mg total) by mouth daily., Disp: 30 tablet, Rfl: 6;  metoprolol (LOPRESSOR) 50 MG tablet, Take 0.5 tablets (25 mg total) by mouth 2 (two) times daily. Take 1/2 tablet twice a day, Disp: 30 tablet, Rfl: 6;  folic acid (FOLVITE) 1 MG tablet, Take 1 tablet (1 mg total) by mouth daily., Disp: 30 tablet, Rfl: 6 Current facility-administered medications:cyanocobalamin ((VITAMIN B-12)) injection 1,000 mcg, 1,000 mcg, Intramuscular, Once, [name], MD, 1,000 mcg at 10/01/12 0947  EXAM:  `BP 130/76  Pulse 89  Temp 98.6 F (37 C) (Oral)  Wt  138 lb (62.596 kg)  SpO2 96%  GENERAL: vitals reviewed and listed above, alert, oriented, appears well hydrated and in no acute distress  HEENT: atraumatic, conjunttiva clear, no obvious abnormalities on inspection of external nose and ears  NECK: no obvious masses on inspection palpation   LUNGS: clear to auscultation bilaterally, no wheezes, rales or rhonchi, good air movement  CV: HRRR, no clubbing cyanosis or  peripheral edema nl cap refill   MS: moves all extremities without noticeable focal  abnormality  PSYCH: pleasant and cooperative, no obvious depression or anxiety  Last cbc hg down to 10 range and mcv up to 121 and  Folate low and b12 low   Bmp ok potassium 3.4  ASSESSMENT AND PLAN:  Discussed the following  assessment and plan:  1. ANEMIA, B12 DEFICIENCY  Vitamin B12, Intrinsic Factor Antibodies, CBC with Differential, Methylmalonic acid(mma), rnd urine, Methylmalonic acid, serum, cyanocobalamin ((VITAMIN B-12)) injection 1,000 mcg   Abnormal blood count which more severe than in the past. Denies alcohol use and pancreatitis symptoms ; gets occasional diarrhea. Appeared to to replenish these  2. ANEMIA, MACROCYTIC  Vitamin B12, Intrinsic Factor Antibodies, CBC with Differential, Methylmalonic acid(mma), rnd urine  3. HYPERTENSION  Vitamin B12, Intrinsic Factor Antibodies, CBC with Differential, Methylmalonic acid(mma), rnd urine  4. Numbness and tingling of both legs below knees  Vitamin B12, Intrinsic Factor Antibodies, CBC with Differential, Methylmalonic acid(mma), rnd urine   HT stable   But potassium 3.4  -Patient advised to return or notify a doctor immediately if symptoms worsen or persist or new concerns arise.  Patient Instructions  Need to begin vit b12 injections as soon as possible  And add  Folate 1 mg per day also.  Concern that untreated b12 deficiency can cause anemia fatigue numbness that can be permanent.  Uncertain why you have this problem sometimes it is a Gi issues and sometimes it is a  Autoimmune  Problem  But   Either way need to get treated for it.  Cyanocobalamin, Vitamin B12 injection What is this medicine?   CYANOCOBALAMIN (sye an oh koe BAL a min) is a man made form of vitamin B12. Vitamin B12 is used in the growth of healthy blood cells, nerve cells, and proteins in the body. It also helps with the metabolism of fats and carbohydrates. This medicine is used to treat people who can not absorb vitamin B12. This medicine may be used for other purposes; ask your health care provider or pharmacist if you have questions. What should I tell my health care provider before I take this medicine? They need to know if you have any of these conditions: -kidney disease -Leber's  disease -megaloblastic anemia -an unusual or allergic reaction to cyanocobalamin, cobalt, other medicines, foods, dyes, or preservatives -pregnant or trying to get pregnant -breast-feeding How should I use this medicine? This medicine is injected into a muscle or deeply under the skin. It is usually given by a health care professional in a clinic or doctor's office. However, your doctor may teach you how to inject yourself. Follow all instructions. Talk to your pediatrician regarding the use of this medicine in children. Special care may be needed. Overdosage: If you think you have taken too much of this medicine contact a poison control center or emergency room at once. NOTE: This medicine is only for you. Do not share this medicine with others. What if I miss a dose? If you are given your dose at a clinic or doctor's  office, call to reschedule your appointment. If you give your own injections and you miss a dose, take it as soon as you can. If it is almost time for your next dose, take only that dose. Do not take double or extra doses. What may interact with this medicine? -colchicine -heavy alcohol intake This list may not describe all possible interactions. Give your health care provider a list of all the medicines, herbs, non-prescription drugs, or dietary supplements you use. Also tell them if you smoke, drink alcohol, or use illegal drugs. Some items may interact with your medicine. What should I watch for while using this medicine? Visit your doctor or health care professional regularly. You may need blood work done while you are taking this medicine. You may need to follow a special diet. Talk to your doctor. Limit your alcohol intake and avoid smoking to get the best benefit. What side effects may I notice from receiving this medicine? Side effects that you should report to your doctor or health care professional as soon as possible: -allergic reactions like skin rash, itching or  hives, swelling of the face, lips, or tongue -blue tint to skin -chest tightness, pain -difficulty breathing, wheezing -dizziness -red, swollen painful area on the leg Side effects that usually do not require medical attention (report to your doctor or health care professional if they continue or are bothersome): -diarrhea -headache This list may not describe all possible side effects. Call your doctor for medical advice about side effects. You may report side effects to FDA at 1-800-FDA-1088. Where should I keep my medicine? Keep out of the reach of children. Store at room temperature between 15 and 30 degrees C (59 and 85 degrees F). Protect from light. Throw away any unused medicine after the expiration date. NOTE: This sheet is a summary. It may not cover all possible information. If you have questions about this medicine, talk to your doctor, pharmacist, or health care provider.  2012, Elsevier/Gold Standard. (02/28/2008 10:10:20 PM)Pernicious Anemia Pernicious anemia may be an immune system illness. It causes the production of antibodies to cells of the stomach (parietal cells), and proteins produced by the stomach, which are needed to absorb vitamin B12. The result of this illness is the body does not absorb enough B12 from the diet. This leads to lessened red blood cell production which causes anemia. Vitamin B12 is needed for making red blood cells and keeping the nervous system healthy. Not enough vitamin B12 in your system slowly affects sensory and motor nerves. This causes problems with your nervous system (neurological) to develop over time. Neurological effects of vitamin B12 deficiency may be seen before anemia is diagnosed. This affects both men and women, between ages 47 and 54. The anemia also affects the bowel, the heart and vascular systems and cannot be prevented. CAUSES  Pernicious anemia is due to a lack of substance called intrinsic factor. This is a substance made by cells  in the stomach. It makes it possible to absorb vitamin B12. The reason for the lack of this substance is unknown but it may be autoimmune, genetic, or both. SYMPTOMS  The following problems may be seen with this illness:  Problems develop slowly.  Rapid heart rate.  Nausea, appetite loss, and weight loss.  Difficulty maintaining proper balance.  Yellow eyes and skin.  Loss of deep tendon reflexes.  Depression.  Confusion, poor memory, and dementia.  Ringing in the ears (tinnitus).  Weakness, especially in the arms and legs.  Sore  tongue.  Numbness or tingling in the hands and feet.  Pale lips, tongue, and gums.  Bleeding gums.  Shortness of breath.  Headache.  Fatigue. RISK OF VITAMIN B12 DEFICIENCY INCREASES WITH:  Diseases or surgery affecting the stomach.  Diabetes and autoimmune disorders. Autoimmune disorders are diseases where the body makes antibodies which attack your own body tissues.  Thyroid disorders.  Genetic factors, such as in people of Northern European ancestry. It is rare in African Americans and Asians.  Family history of pernicious anemia.  Age over 41.  Strict vegetarian diet or infants breast-fed by a mother on a strict vegetarian diet.  Lack of stomach acid in older adults.  Parasitic infections and intestinal diseases.  Drugs such as H2 blockers, proton pump inhibitors, colchicine, neomycin, and aminosalicylic acid.  Alcoholism. PREVENTION  Pernicious anemia cannot be prevented but vitamin B12 deficiencies can be prevented.  For pernicious anemia, lifelong vitamin B12 therapy will help symptoms and prevent complications.  Dietary changes can prevent deficiency. B12 is mostly from animal sources so a deficiency is more likely in a vegetarian who does not eat eggs or dairy products. RELATED COMPLICATIONS  Heart failure.  Nerve damage that can not be reversed.  Gastric cancer. DIAGNOSIS  Your caregiver can determine what  is wrong by:  Doing blood tests for vitamin B12 levels.  Checking for antibodies to the intrinsic factor.  Measuring the body's ability to absorb vitamin B12. TREATMENT   Life long treatment usually involves vitamin B12 replacement. Monthly vitamin B12 injections are the treatment of choice to correct vitamin B12 deficiency and may be given by the patient. This therapy corrects the anemia and it may correct the neurological complications if given early enough. About 1% of vitamin B12 is absorbed (even in the absence of intrinsic factor) so some caregivers recommend that elderly patients with gastric atrophy take oral vitamin B12 supplements in addition to monthly injections.  Some symptoms should start to clear up in a few days after treatment begins but other symptoms may take several months.  Additionally, other conditions which may lead to a deficiency should be treated.  Stop drinking alcohol if alcoholism led to the vitamin B12 deficiency.  For other patients, the vitamin may be taken by mouth or as a nasal gel (or in addition to injections).  Iron supplements may be prescribed.  Avoid taking high amounts of folic acid. It can mask the signs of vitamin B12 deficiency.  Activity may be limited until symptoms improve.  Eat a well-balanced diet.  People on strict vegetarian diets can change their diet or take vitamin B12 supplements for life. PROGNOSIS   When caught early, the prognosis is good. Most people will do well.  Patients with this illness have a higher incidence of cancer and polyps of the stomach.  Nervous system problems may not improve if treatment does not start soon enough. Document Released: 02/07/2004 Document Revised: 02/09/2012 Document Reviewed: 11/14/2008 [facility] Patient Information 2013 [place], [place].       Lorretta Harp

## 2012-10-01 NOTE — Assessment & Plan Note (Signed)
Concern about severe nutritional deficiency vs  Autoimmune causing this . Begin injections weekly can work in at end of day with job. Add folic acid rx given for 1 mg also . Denies current use of etoh  But gets ocass diarrhea at work  No other malabsorption pictures   Denies active bleeding  May need to see Heme and or  GI.

## 2012-10-01 NOTE — Patient Instructions (Signed)
Need to begin vit b12 injections as soon as possible  And add  Folate 1 mg per day also.  Concern that untreated b12 deficiency can cause anemia fatigue numbness that can be permanent.  Uncertain why you have this problem sometimes it is a Gi issues and sometimes it is a  Autoimmune  Problem  But   Either way need to get treated for it.  Cyanocobalamin, Vitamin B12 injection What is this medicine?   CYANOCOBALAMIN (sye an oh koe BAL a min) is a man made form of vitamin B12. Vitamin B12 is used in the growth of healthy blood cells, nerve cells, and proteins in the body. It also helps with the metabolism of fats and carbohydrates. This medicine is used to treat people who can not absorb vitamin B12. This medicine may be used for other purposes; ask your health care provider or pharmacist if you have questions. What should I tell my health care provider before I take this medicine? They need to know if you have any of these conditions: -kidney disease -Leber's disease -megaloblastic anemia -an unusual or allergic reaction to cyanocobalamin, cobalt, other medicines, foods, dyes, or preservatives -pregnant or trying to get pregnant -breast-feeding How should I use this medicine? This medicine is injected into a muscle or deeply under the skin. It is usually given by a health care professional in a clinic or doctor's office. However, your doctor may teach you how to inject yourself. Follow all instructions. Talk to your pediatrician regarding the use of this medicine in children. Special care may be needed. Overdosage: If you think you have taken too much of this medicine contact a poison control center or emergency room at once. NOTE: This medicine is only for you. Do not share this medicine with others. What if I miss a dose? If you are given your dose at a clinic or doctor's office, call to reschedule your appointment. If you give your own injections and you miss a dose, take it as soon as you  can. If it is almost time for your next dose, take only that dose. Do not take double or extra doses. What may interact with this medicine? -colchicine -heavy alcohol intake This list may not describe all possible interactions. Give your health care provider a list of all the medicines, herbs, non-prescription drugs, or dietary supplements you use. Also tell them if you smoke, drink alcohol, or use illegal drugs. Some items may interact with your medicine. What should I watch for while using this medicine? Visit your doctor or health care professional regularly. You may need blood work done while you are taking this medicine. You may need to follow a special diet. Talk to your doctor. Limit your alcohol intake and avoid smoking to get the best benefit. What side effects may I notice from receiving this medicine? Side effects that you should report to your doctor or health care professional as soon as possible: -allergic reactions like skin rash, itching or hives, swelling of the face, lips, or tongue -blue tint to skin -chest tightness, pain -difficulty breathing, wheezing -dizziness -red, swollen painful area on the leg Side effects that usually do not require medical attention (report to your doctor or health care professional if they continue or are bothersome): -diarrhea -headache This list may not describe all possible side effects. Call your doctor for medical advice about side effects. You may report side effects to FDA at 1-800-FDA-1088. Where should I keep my medicine? Keep out of the reach  of children. Store at room temperature between 15 and 30 degrees C (59 and 85 degrees F). Protect from light. Throw away any unused medicine after the expiration date. NOTE: This sheet is a summary. It may not cover all possible information. If you have questions about this medicine, talk to your doctor, pharmacist, or health care provider.  2012, Elsevier/Gold Standard. (02/28/2008 10:10:20  PM)Pernicious Anemia Pernicious anemia may be an immune system illness. It causes the production of antibodies to cells of the stomach (parietal cells), and proteins produced by the stomach, which are needed to absorb vitamin B12. The result of this illness is the body does not absorb enough B12 from the diet. This leads to lessened red blood cell production which causes anemia. Vitamin B12 is needed for making red blood cells and keeping the nervous system healthy. Not enough vitamin B12 in your system slowly affects sensory and motor nerves. This causes problems with your nervous system (neurological) to develop over time. Neurological effects of vitamin B12 deficiency may be seen before anemia is diagnosed. This affects both men and women, between ages 40 and 3. The anemia also affects the bowel, the heart and vascular systems and cannot be prevented. CAUSES  Pernicious anemia is due to a lack of substance called intrinsic factor. This is a substance made by cells in the stomach. It makes it possible to absorb vitamin B12. The reason for the lack of this substance is unknown but it may be autoimmune, genetic, or both. SYMPTOMS  The following problems may be seen with this illness:  Problems develop slowly.  Rapid heart rate.  Nausea, appetite loss, and weight loss.  Difficulty maintaining proper balance.  Yellow eyes and skin.  Loss of deep tendon reflexes.  Depression.  Confusion, poor memory, and dementia.  Ringing in the ears (tinnitus).  Weakness, especially in the arms and legs.  Sore tongue.  Numbness or tingling in the hands and feet.  Pale lips, tongue, and gums.  Bleeding gums.  Shortness of breath.  Headache.  Fatigue. RISK OF VITAMIN B12 DEFICIENCY INCREASES WITH:  Diseases or surgery affecting the stomach.  Diabetes and autoimmune disorders. Autoimmune disorders are diseases where the body makes antibodies which attack your own body tissues.  Thyroid  disorders.  Genetic factors, such as in people of Northern European ancestry. It is rare in African Americans and Asians.  Family history of pernicious anemia.  Age over 62.  Strict vegetarian diet or infants breast-fed by a mother on a strict vegetarian diet.  Lack of stomach acid in older adults.  Parasitic infections and intestinal diseases.  Drugs such as H2 blockers, proton pump inhibitors, colchicine, neomycin, and aminosalicylic acid.  Alcoholism. PREVENTION  Pernicious anemia cannot be prevented but vitamin B12 deficiencies can be prevented.  For pernicious anemia, lifelong vitamin B12 therapy will help symptoms and prevent complications.  Dietary changes can prevent deficiency. B12 is mostly from animal sources so a deficiency is more likely in a vegetarian who does not eat eggs or dairy products. RELATED COMPLICATIONS  Heart failure.  Nerve damage that can not be reversed.  Gastric cancer. DIAGNOSIS  Your caregiver can determine what is wrong by:  Doing blood tests for vitamin B12 levels.  Checking for antibodies to the intrinsic factor.  Measuring the body's ability to absorb vitamin B12. TREATMENT   Life long treatment usually involves vitamin B12 replacement. Monthly vitamin B12 injections are the treatment of choice to correct vitamin B12 deficiency and may be given by  the patient. This therapy corrects the anemia and it may correct the neurological complications if given early enough. About 1% of vitamin B12 is absorbed (even in the absence of intrinsic factor) so some caregivers recommend that elderly patients with gastric atrophy take oral vitamin B12 supplements in addition to monthly injections.  Some symptoms should start to clear up in a few days after treatment begins but other symptoms may take several months.  Additionally, other conditions which may lead to a deficiency should be treated.  Stop drinking alcohol if alcoholism led to the vitamin  B12 deficiency.  For other patients, the vitamin may be taken by mouth or as a nasal gel (or in addition to injections).  Iron supplements may be prescribed.  Avoid taking high amounts of folic acid. It can mask the signs of vitamin B12 deficiency.  Activity may be limited until symptoms improve.  Eat a well-balanced diet.  People on strict vegetarian diets can change their diet or take vitamin B12 supplements for life. PROGNOSIS   When caught early, the prognosis is good. Most people will do well.  Patients with this illness have a higher incidence of cancer and polyps of the stomach.  Nervous system problems may not improve if treatment does not start soon enough. Document Released: 02/07/2004 Document Revised: 02/09/2012 Document Reviewed: 11/14/2008 [facility] Patient Information 2013 [place], [place].

## 2012-10-04 LAB — PATHOLOGIST SMEAR REVIEW

## 2012-10-08 ENCOUNTER — Ambulatory Visit (INDEPENDENT_AMBULATORY_CARE_PROVIDER_SITE_OTHER): Payer: Self-pay | Admitting: Family Medicine

## 2012-10-08 DIAGNOSIS — E538 Deficiency of other specified B group vitamins: Secondary | ICD-10-CM

## 2012-10-08 MED ORDER — CYANOCOBALAMIN 1000 MCG/ML IJ SOLN
1000.0000 ug | Freq: Once | INTRAMUSCULAR | Status: AC
  Administered 2012-10-08: 1000 ug via INTRAMUSCULAR

## 2012-10-15 ENCOUNTER — Ambulatory Visit: Payer: Self-pay | Admitting: Family Medicine

## 2012-10-21 ENCOUNTER — Telehealth: Payer: Self-pay | Admitting: Family Medicine

## 2012-10-21 NOTE — Telephone Encounter (Signed)
This patient called and left a voice message.  She would like to come in tomorrow (10-22-12) morning for her B-12 injection.  Please schedule the pt in an available opening.  Thanks!!!

## 2012-10-21 NOTE — Telephone Encounter (Signed)
Pt has been sch for tommorrow °

## 2012-10-22 ENCOUNTER — Ambulatory Visit (INDEPENDENT_AMBULATORY_CARE_PROVIDER_SITE_OTHER): Payer: Self-pay | Admitting: Family Medicine

## 2012-10-22 ENCOUNTER — Ambulatory Visit: Payer: Self-pay | Admitting: Family Medicine

## 2012-10-22 DIAGNOSIS — D518 Other vitamin B12 deficiency anemias: Secondary | ICD-10-CM

## 2012-10-22 DIAGNOSIS — D519 Vitamin B12 deficiency anemia, unspecified: Secondary | ICD-10-CM

## 2012-10-22 MED ORDER — CYANOCOBALAMIN 1000 MCG/ML IJ SOLN
1000.0000 ug | Freq: Once | INTRAMUSCULAR | Status: AC
  Administered 2012-10-22: 1000 ug via INTRAMUSCULAR

## 2012-10-29 ENCOUNTER — Ambulatory Visit (INDEPENDENT_AMBULATORY_CARE_PROVIDER_SITE_OTHER): Payer: Self-pay | Admitting: Family Medicine

## 2012-10-29 DIAGNOSIS — D518 Other vitamin B12 deficiency anemias: Secondary | ICD-10-CM

## 2012-10-29 DIAGNOSIS — D519 Vitamin B12 deficiency anemia, unspecified: Secondary | ICD-10-CM

## 2012-10-29 MED ORDER — CYANOCOBALAMIN 1000 MCG/ML IJ SOLN
1000.0000 ug | Freq: Once | INTRAMUSCULAR | Status: AC
  Administered 2012-10-29: 1000 ug via INTRAMUSCULAR

## 2012-11-05 ENCOUNTER — Ambulatory Visit (INDEPENDENT_AMBULATORY_CARE_PROVIDER_SITE_OTHER): Payer: Self-pay | Admitting: Family Medicine

## 2012-11-05 DIAGNOSIS — E538 Deficiency of other specified B group vitamins: Secondary | ICD-10-CM

## 2012-11-05 MED ORDER — CYANOCOBALAMIN 1000 MCG/ML IJ SOLN
1000.0000 ug | Freq: Once | INTRAMUSCULAR | Status: AC
  Administered 2012-11-05: 1000 ug via INTRAMUSCULAR

## 2012-11-12 ENCOUNTER — Ambulatory Visit: Payer: Self-pay | Admitting: Family Medicine

## 2012-11-19 ENCOUNTER — Ambulatory Visit: Payer: Self-pay | Admitting: Family Medicine

## 2012-11-26 ENCOUNTER — Ambulatory Visit (INDEPENDENT_AMBULATORY_CARE_PROVIDER_SITE_OTHER): Payer: Self-pay | Admitting: Family Medicine

## 2012-11-26 DIAGNOSIS — D519 Vitamin B12 deficiency anemia, unspecified: Secondary | ICD-10-CM

## 2012-11-26 DIAGNOSIS — D518 Other vitamin B12 deficiency anemias: Secondary | ICD-10-CM

## 2012-11-26 LAB — CBC WITH DIFFERENTIAL/PLATELET
Basophils Absolute: 0 10*3/uL (ref 0.0–0.1)
Eosinophils Absolute: 0.3 10*3/uL (ref 0.0–0.7)
Hemoglobin: 10.9 g/dL — ABNORMAL LOW (ref 12.0–15.0)
Lymphocytes Relative: 24.3 % (ref 12.0–46.0)
MCHC: 32.6 g/dL (ref 30.0–36.0)
Monocytes Relative: 4.9 % (ref 3.0–12.0)
Neutrophils Relative %: 64 % (ref 43.0–77.0)
RBC: 3.08 Mil/uL — ABNORMAL LOW (ref 3.87–5.11)
RDW: 17.6 % — ABNORMAL HIGH (ref 11.5–14.6)

## 2012-11-26 LAB — VITAMIN B12: Vitamin B-12: 363 pg/mL (ref 211–911)

## 2012-11-26 MED ORDER — CYANOCOBALAMIN 1000 MCG/ML IJ SOLN
1000.0000 ug | Freq: Once | INTRAMUSCULAR | Status: AC
  Administered 2012-11-26: 1000 ug via INTRAMUSCULAR

## 2012-12-03 ENCOUNTER — Ambulatory Visit (INDEPENDENT_AMBULATORY_CARE_PROVIDER_SITE_OTHER): Payer: Self-pay | Admitting: Family Medicine

## 2012-12-03 DIAGNOSIS — D649 Anemia, unspecified: Secondary | ICD-10-CM

## 2012-12-03 DIAGNOSIS — D519 Vitamin B12 deficiency anemia, unspecified: Secondary | ICD-10-CM

## 2012-12-03 DIAGNOSIS — D518 Other vitamin B12 deficiency anemias: Secondary | ICD-10-CM

## 2012-12-03 MED ORDER — CYANOCOBALAMIN 1000 MCG/ML IJ SOLN
1000.0000 ug | Freq: Once | INTRAMUSCULAR | Status: AC
  Administered 2012-12-03: 1000 ug via INTRAMUSCULAR

## 2012-12-10 ENCOUNTER — Ambulatory Visit (INDEPENDENT_AMBULATORY_CARE_PROVIDER_SITE_OTHER): Payer: Self-pay | Admitting: Family Medicine

## 2012-12-10 ENCOUNTER — Ambulatory Visit: Payer: Self-pay | Admitting: Family Medicine

## 2012-12-10 DIAGNOSIS — D519 Vitamin B12 deficiency anemia, unspecified: Secondary | ICD-10-CM

## 2012-12-10 DIAGNOSIS — D518 Other vitamin B12 deficiency anemias: Secondary | ICD-10-CM

## 2012-12-10 MED ORDER — CYANOCOBALAMIN 1000 MCG/ML IJ SOLN
1000.0000 ug | Freq: Once | INTRAMUSCULAR | Status: AC
  Administered 2012-12-10: 1000 ug via INTRAMUSCULAR

## 2012-12-17 ENCOUNTER — Ambulatory Visit: Payer: Self-pay | Admitting: Family Medicine

## 2012-12-24 ENCOUNTER — Ambulatory Visit: Payer: Self-pay | Admitting: Family Medicine

## 2012-12-31 ENCOUNTER — Ambulatory Visit (INDEPENDENT_AMBULATORY_CARE_PROVIDER_SITE_OTHER): Payer: Self-pay | Admitting: Family Medicine

## 2012-12-31 DIAGNOSIS — D518 Other vitamin B12 deficiency anemias: Secondary | ICD-10-CM

## 2012-12-31 MED ORDER — CYANOCOBALAMIN 1000 MCG/ML IJ SOLN
1000.0000 ug | Freq: Once | INTRAMUSCULAR | Status: AC
  Administered 2012-12-31: 1000 ug via INTRAMUSCULAR

## 2013-01-07 ENCOUNTER — Other Ambulatory Visit: Payer: Self-pay

## 2013-01-14 ENCOUNTER — Ambulatory Visit: Payer: Self-pay | Admitting: Internal Medicine

## 2013-02-22 ENCOUNTER — Ambulatory Visit (INDEPENDENT_AMBULATORY_CARE_PROVIDER_SITE_OTHER): Payer: Self-pay | Admitting: Internal Medicine

## 2013-02-22 ENCOUNTER — Encounter: Payer: Self-pay | Admitting: Internal Medicine

## 2013-02-22 VITALS — BP 120/76 | Temp 98.0°F | Ht 59.5 in | Wt 134.0 lb

## 2013-02-22 DIAGNOSIS — Z9289 Personal history of other medical treatment: Secondary | ICD-10-CM | POA: Insufficient documentation

## 2013-02-22 DIAGNOSIS — R198 Other specified symptoms and signs involving the digestive system and abdomen: Secondary | ICD-10-CM

## 2013-02-22 DIAGNOSIS — R7611 Nonspecific reaction to tuberculin skin test without active tuberculosis: Secondary | ICD-10-CM

## 2013-02-22 DIAGNOSIS — Z0000 Encounter for general adult medical examination without abnormal findings: Secondary | ICD-10-CM

## 2013-02-22 DIAGNOSIS — I10 Essential (primary) hypertension: Secondary | ICD-10-CM

## 2013-02-22 DIAGNOSIS — R194 Change in bowel habit: Secondary | ICD-10-CM

## 2013-02-22 DIAGNOSIS — D518 Other vitamin B12 deficiency anemias: Secondary | ICD-10-CM

## 2013-02-22 DIAGNOSIS — F17200 Nicotine dependence, unspecified, uncomplicated: Secondary | ICD-10-CM

## 2013-02-22 DIAGNOSIS — D649 Anemia, unspecified: Secondary | ICD-10-CM

## 2013-02-22 DIAGNOSIS — D539 Nutritional anemia, unspecified: Secondary | ICD-10-CM

## 2013-02-22 LAB — CBC WITH DIFFERENTIAL/PLATELET
Basophils Relative: 0.8 % (ref 0.0–3.0)
Eosinophils Relative: 1.8 % (ref 0.0–5.0)
Hemoglobin: 10.6 g/dL — ABNORMAL LOW (ref 12.0–15.0)
Lymphocytes Relative: 39.3 % (ref 12.0–46.0)
Monocytes Relative: 8 % (ref 3.0–12.0)
Neutro Abs: 2 10*3/uL (ref 1.4–7.7)
Neutrophils Relative %: 50.1 % (ref 43.0–77.0)
RBC: 3.03 Mil/uL — ABNORMAL LOW (ref 3.87–5.11)
WBC: 4 10*3/uL — ABNORMAL LOW (ref 4.5–10.5)

## 2013-02-22 NOTE — Patient Instructions (Signed)
Follow   Ppd positive  With chest x ray .   Take oral  dissolvable b12 1000 mcg to 2000 mcg per day while we have the b12 hortage.  Try probiotic for the bowel issue and seei helps .   Check into [email or web address]  For  of generics .  PAP smear due next year  .  Continue to stop tobacco  You will save money and help your health.  Will notify you  of labs when available. And then decide  Follow up testing  And depending on  b12 availablilty.

## 2013-02-22 NOTE — Progress Notes (Signed)
Chief Complaint  Patient presents with  . Annual Exam    Followup anemia    HPI: Patient comes in today for Preventive Health Care visit  Since her last visit she was put on B12 injections which she faithfully came in for however because of a shortage of B12 she's been off of it for a month percent. She states she doesn't really feel any different since beginning meds. She is taking her folate acid and her blood pressure medication. Smoking down to a third of a pack per day doesn't smoke all day when she's at work. No significant alcohol was although now is having one to 2 mixed rinks of the weekend.  ROS:  GEN/ HEENT: No fever, significant weight changes sweats headaches vision problems hearing changes, CV/ PULM; No chest pain shortness of breath cough, syncope,edema  change in exercise tolerance. GI /GU: No adominal pain, vomiting,. No blood in the stool. No significant GU symptoms. Stools most recently her little bit foul-smelling no blood some gas. SKIN/HEME: ,no acute skin rashes suspicious lesions or bleeding. No lymphadenopathy, nodules, masses.  NEURO/ PSYCH:  No neurologic signs such as weakness numbness. No depression anxiety. IMM/ Allergy: No unusual infections.  Allergy .   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 ANEMIA, B12 DEFICIENCY 04/05/2009  . 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 TOBACCO USE 01/25/2008  . 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 HYPERTENSION 01/29/2008  . EXCESSIVE MENSTRUAL BLEEDING 03/18/2010    gyne eval and bx neg  . PERIMENOPAUSAL SYNDROME 08/23/2009  . LEG PAIN, BILATERAL 06/22/2008  . SYNCOPE 01/02/2009  . SWEATING 09/23/2010  . Palpitations 01/02/2009  . THYROID FUNCTION  TEST, ABNORMAL 03/19/2009  . Acute pancreatitis 12/05/2010    felt etoh related   . WEIGHT LOSS-ABNORMAL 12/05/2010  . GASTROINTESTINAL XRAY, ABNORMAL 12/16/2010  . RASH 12/23/2010    Family History  Problem Relation Age of Onset  . Hypertension Mother   . Kidney disease Mother     removed for cyst  . Other Mother     overweight  . Heart attack Father   . Stroke Father     died 26  . Hypertension Brother   . Neuromuscular disorder      spinomuscular atrophy    History   Social History  . Marital Status: Single    Spouse Name: N/A    Number of Children: N/A  . Years of Education: N/A   Social History Main Topics  . Smoking status: Current Every Day Smoker    Types: Cigarettes  . Smokeless tobacco: Never Used     Comment: less than 1/2 pack  . Alcohol Use: No  . Drug Use: No  . Sexually Active: None   Other Topics Concern  . 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 amLODipine (NORVASC) 5 MG tablet Take 1 tablet (5 mg total) by mouth daily.  30 tablet  6  . folic acid (FOLVITE) 1 MG tablet Take 1 tablet (1 mg total) by mouth daily.  30 tablet  6  . metoprolol (LOPRESSOR) 50 MG tablet Take 0.5 tablets (25 mg total) by mouth 2 (two) times daily. Take 1/2 tablet twice a day  30 tablet  6   No facility-administered encounter medications on file as of 02/22/2013.    EXAM:  BP 120/76  Temp(Src) 98 F (36.7 C) (Oral)  Ht 4' 11.5" (1.511 m)  Wt 134 lb (60.782 kg)  BMI 26.62 kg/m2  Body mass index is 26.62 kg/(m^2).  Physical Exam: Vital signs reviewed [id number] is a well-developed well-nourished alert cooperative    female who appears her stated age in no acute distress.  HEENT: normocephalic atraumatic , Eyes: PERRL EOM's full, conjunctiva clear, Nares: paten,t no deformity discharge or tenderness., Ears: no deformity EAC's clear TMs with normal landmarks. Mouth: clear OP, no lesions, edema.  Moist mucous membranes. Dentition in adequate repair. NECK: supple without masses, thyromegaly or bruits. CHEST/PULM:  Clear to auscultation and percussion breath sounds equal no wheeze , rales or rhonchi. No chest wall deformities or tenderness. Breasts nonfocal no masses discharge or lumps noted axilla is clear CV: PMI is nondisplaced, S1 S2 no gallops, murmurs, rubs. Peripheral pulses are full without delay.No JVD .  ABDOMEN: Bowel sounds normal nontender  No guard or rebound, no hepato splenomegal no CVA tenderness.  No hernia. Extremtities:  No clubbing cyanosis or edema, no acute joint swelling or redness no focal atrophy lower extremities and trace edema on the pretibial area with changes no focal atrophy NEURO:  Oriented x3, cranial nerves 3-12 appear to be intact, no obvious focal weakness,gait within normal limits no abnormal reflexes or asymmetrical SKIN: No acute rashes normal turgor, color, no bruising or petechiae. PSYCH: Oriented, good eye contact, no obvious depression anxiety, cognition and judgment appear normal. LN: no cervical axillary inguinal adenopathy  Lab Results  Component Value Date   WBC 4.0* 02/22/2013   HGB 10.6* 02/22/2013   HCT 31.9* 02/22/2013   PLT 211.0 02/22/2013   GLUCOSE 85 08/26/2012   CHOL 166 08/26/2012   TRIG 137.0 08/26/2012   HDL 103.50 08/26/2012   LDLCALC 35 08/26/2012   ALT 14 08/26/2012   AST 33 08/26/2012   NA 138 08/26/2012   K 3.4* 08/26/2012   CL 101 08/26/2012   CREATININE 0.7 08/26/2012   BUN 18 08/26/2012   CO2 23 08/26/2012   TSH 1.35 08/26/2012   HGBA1C 5.2 08/26/2012   Lab Results  Component Value Date   VITAMINB12 363 11/26/2012    ASSESSMENT AND  PLAN:  Discussed the following assessment and plan:  Visit for preventive health examination - Is not due for a Pap smear until next year - Plan: CBC with Differential  UNSPECIFIED ANEMIA - Plan: CBC with Differential  ANEMIA, B12 DEFICIENCY - Plan: CBC with Differential  ANEMIA, MACROCYTIC - Plan: CBC with Differential  HYPERTENSION  Change in bowel habits - Could be oral intake doubt from pancreatic insufficiency try probiotic sample of align given  History of positive PPD - For a number of  years has had negative chest x-rays in the past ;don't get a repeat PPD get a chest x-ray instead  TOBACCO USE - encouraged cesssation Overall I think she is doing much better her hypertension is controlled she is avoiding significant alcohol would like her to stop smoking. Cost is a factor as she doesn't make enough money to get on the new health care exchanges. And [place] doesn't have an extensive Medicaid program. She is continuing to work but doesn't make much money to meet criteria for the exchanges.Marland Kitchen she will keep trying and asks questions to see what she can do.  Even though she states she is not feeling better she's not complaining as much numbness today in her vibratory sense isn't really significantly decreased. Her anemia had been getting somewhat better although it may be a mixed nutritional anemia. Possibly bowel malabsorption. I want her to take oral B12 sublingual would be good to continue her folic acid. We'll get CBC today weight on the B12 level because she's been off the injections. We'll plan followup depending on the labs today.  Financial factors involved. Patient Care Team: [name], MD as PCP - General Patient Instructions  Follow   Ppd positive  With chest x ray .   Take oral  dissolvable b12 1000 mcg to 2000 mcg per day while we have the b12 hortage.  Try probiotic for the bowel issue and seei helps .   Check into [email or web address]  For  of generics .  PAP  smear due next year  .  Continue to stop tobacco  You will save money and help your health.  Will notify you  of labs when available. And then decide  Follow up testing  And depending on  b12 availablilty.     Neta Mends. Bell Cai M.D.  Health Maintenance  Topic Date Due  . Influenza Vaccine  08/01/2012  . Pap Smear  02/20/2014  . Tetanus/tdap  03/20/2019   Health Maintenance Review

## 2013-02-24 ENCOUNTER — Ambulatory Visit: Payer: Self-pay | Admitting: Internal Medicine

## 2013-03-25 ENCOUNTER — Other Ambulatory Visit: Payer: Self-pay | Admitting: Internal Medicine

## 2013-03-28 MED ORDER — AMLODIPINE BESYLATE 5 MG PO TABS: 5.0000 mg | ORAL_TABLET | Freq: Every day | ORAL | Status: DC

## 2013-04-04 ENCOUNTER — Encounter: Payer: Self-pay | Admitting: Family Medicine

## 2013-08-25 ENCOUNTER — Ambulatory Visit: Payer: Self-pay | Admitting: Internal Medicine

## 2013-10-06 ENCOUNTER — Other Ambulatory Visit: Payer: Self-pay

## 2014-01-05 ENCOUNTER — Other Ambulatory Visit: Payer: Self-pay | Admitting: Internal Medicine

## 2014-06-06 ENCOUNTER — Encounter: Payer: Self-pay | Admitting: Internal Medicine

## 2014-06-06 ENCOUNTER — Telehealth: Payer: Self-pay | Admitting: Internal Medicine

## 2014-06-06 ENCOUNTER — Ambulatory Visit (INDEPENDENT_AMBULATORY_CARE_PROVIDER_SITE_OTHER): Payer: Self-pay | Admitting: Internal Medicine

## 2014-06-06 VITALS — BP 128/80 | HR 79 | Temp 98.9°F | Ht 59.5 in | Wt 133.0 lb

## 2014-06-06 DIAGNOSIS — M67431 Ganglion, right wrist: Secondary | ICD-10-CM

## 2014-06-06 DIAGNOSIS — I10 Essential (primary) hypertension: Secondary | ICD-10-CM

## 2014-06-06 DIAGNOSIS — M67439 Ganglion, unspecified wrist: Secondary | ICD-10-CM | POA: Insufficient documentation

## 2014-06-06 DIAGNOSIS — Z598 Other problems related to housing and economic circumstances: Secondary | ICD-10-CM

## 2014-06-06 DIAGNOSIS — K862 Cyst of pancreas: Secondary | ICD-10-CM

## 2014-06-06 DIAGNOSIS — D539 Nutritional anemia, unspecified: Secondary | ICD-10-CM

## 2014-06-06 DIAGNOSIS — Z87898 Personal history of other specified conditions: Secondary | ICD-10-CM | POA: Insufficient documentation

## 2014-06-06 DIAGNOSIS — M6740 Ganglion, unspecified site: Secondary | ICD-10-CM

## 2014-06-06 DIAGNOSIS — K863 Pseudocyst of pancreas: Secondary | ICD-10-CM

## 2014-06-06 DIAGNOSIS — Z5989 Other problems related to housing and economic circumstances: Secondary | ICD-10-CM | POA: Insufficient documentation

## 2014-06-06 DIAGNOSIS — F17200 Nicotine dependence, unspecified, uncomplicated: Secondary | ICD-10-CM

## 2014-06-06 MED ORDER — AMLODIPINE BESYLATE 5 MG PO TABS: 5.0000 mg | ORAL_TABLET | Freq: Every day | ORAL | Status: DC

## 2014-06-06 MED ORDER — METOPROLOL TARTRATE 50 MG PO TABS: ORAL_TABLET | ORAL | Status: DC

## 2014-06-06 NOTE — Progress Notes (Signed)
Pre visit review using our clinic review tool, if applicable. No additional management support is needed unless otherwise documented below in the visit note.   Chief Complaint  Patient presents with  . Follow-up    HPI: Last ov was over a year ago  Patient comes in today for SDA for problem evaluation. Since then no major changes in health but is running out of medication she is taking metoprolol half by mouth twice a day and amlodipine. She thinks her blood pressures generally okay sometimes goes up almost out of medicationOne left of both   Still smoking Pack 2.5 days.  Denies any shortness of breath or cough did have an episode of chest pain off and on left without associated symptoms sharp no nausea vomiting shortness of breath. No arrhythmia symptoms. Still has no job and no insurance apparently not eligible subsidized programs  ROS: See pertinent positives and negatives per HPi  using alcohol only rarely or on occasion social. Past Medical History  Diagnosis Date  . ANEMIA, B12 DEFICIENCY 04/05/2009  . 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 TOBACCO USE 01/25/2008  . 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 HYPERTENSION 01/29/2008  . EXCESSIVE MENSTRUAL BLEEDING 03/18/2010    gyne eval and bx neg  . PERIMENOPAUSAL SYNDROME 08/23/2009  . LEG PAIN, BILATERAL 06/22/2008  . SYNCOPE 01/02/2009  . SWEATING 09/23/2010  . Palpitations 01/02/2009  . THYROID FUNCTION TEST, ABNORMAL 03/19/2009  . Acute pancreatitis 12/05/2010    felt etoh related   . WEIGHT LOSS-ABNORMAL 12/05/2010  . GASTROINTESTINAL XRAY, ABNORMAL 12/16/2010  . RASH 12/23/2010    Family History  Problem Relation Age of Onset  . Hypertension Mother   . Kidney disease Mother     removed for cyst  . Other Mother     overweight  . Heart attack Father   . Stroke Father     died [id number]  . Hypertension Brother   . Neuromuscular disorder      spinomuscular atrophy    History   Social History  . Marital  Status: Single    Spouse Name: N/A    Number of Children: N/A  . Years of Education: N/A   Social History Main Topics  . Smoking status: Current Every Day Smoker    Types: Cigarettes  . Smokeless tobacco: Never Used     Comment: less than 1/2 pack  . Alcohol Use: No  . Drug Use: No  . Sexual Activity: None   Other Topics Concern  . 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 folic acid (FOLVITE) 1 MG tablet Take 1 tablet (1 mg total) by mouth daily.  . metoprolol (LOPRESSOR) 50 MG tablet Take half tablet twice daily  . [DISCONTINUED] metoprolol (LOPRESSOR) 50 MG tablet TAKE ONE-HALF TABLET BY MOUTH TWICE DAILY  . [DISCONTINUED] metoprolol (LOPRESSOR) 50 MG tablet Take half tablet twice daily  .  amLODipine (NORVASC) 5 MG tablet Take 1 tablet (5 mg total) by mouth daily.  . [DISCONTINUED] amLODipine (NORVASC) 5 MG tablet Take 1 tablet (5 mg total) by mouth daily.    EXAM:  BP 128/80  Pulse 79  Temp(Src) 98.9 F (37.2 C) (Oral)  Ht 4' 11.5" (1.511 m)  Wt 133 lb (60.328 kg)  BMI 26.42 kg/m2  SpO2 97%  Body mass index is 26.42 kg/(m^2).  GENERAL: vitals reviewed and listed above, alert, oriented, appears well hydrated and in no acute distress HEENT: atraumatic, conjunctiva  clear, no obvious abnormalities on inspection of external nose and ears OP : no lesion edema or exudate  NECK: no obvious masses on inspection palpation  no adenopathy LUNGS: clear to auscultation bilaterally, no wheezes, rales or rhonchi, good air movement CV: HRRR, no clubbing cyanosis or  peripheral edema nl cap refill  Abdomen soft without organomegaly guarding or  rebound she does have some mild tenderness in epigastrium that she states is old and is always been that way. MS: moves all extremities without noticeable focal  abnormality has a ganglion system the right wrist  PSYCH: pleasant and cooperative, no obvious depression or anxiety Lab Results  Component Value Date   WBC 4.0* 02/22/2013   HGB 10.6* 02/22/2013   HCT 31.9* 02/22/2013   PLT 211.0 02/22/2013   GLUCOSE 85 08/26/2012   CHOL 166 08/26/2012   TRIG 137.0 08/26/2012   HDL 103.50 08/26/2012   LDLCALC 35 08/26/2012   ALT 14 08/26/2012   AST 33 08/26/2012   NA 138 08/26/2012   K 3.4* 08/26/2012   CL 101 08/26/2012   CREATININE 0.7 08/26/2012   BUN 18 08/26/2012   CO2 23 08/26/2012   TSH 1.35 08/26/2012   HGBA1C 5.2 08/26/2012    ASSESSMENT AND PLAN:  Discussed the following assessment and plan:  Unspecified essential hypertension - Apparently controlled. Continue medications refilled  Ganglion cyst of wrist, right  TOBACCO USE  History of chest pain at rest - Atypical symptoms for cardiac will follow more evaluation is appropriate stop smoking  Does not have health insurance - Look into indigent care program apply for kind.  Unspecified deficiency anemia - Due for monitoring can wait till CPX  Pseudocyst, pancreas - Reviewed with patient Due for medication monitoring the can do this as a preventive visit if I should look into insurance coverage programs Reviewed alarm findings to seek emergent care. -Patient advised to return or notify health care team  if symptoms worsen ,persist or new concerns arise.  Patient Instructions  Refill medication today .   Avoid alcohol  To  Not inflame pancreas Stop tobacco as discussed .  Set up for cpx and labs   ;look into applying [facility]     Neta MendsWanda K. Panosh M.D.

## 2014-06-06 NOTE — Patient Instructions (Addendum)
Refill medication today .   Avoid alcohol  To  Not inflame pancreas Stop tobacco as discussed .  Set up for cpx and labs   ;look into applying the indigent care program [facility]

## 2014-06-06 NOTE — Telephone Encounter (Signed)
Relevant patient education mailed to patient.  

## 2014-06-16 ENCOUNTER — Ambulatory Visit: Payer: Self-pay | Admitting: Internal Medicine

## 2014-09-27 ENCOUNTER — Other Ambulatory Visit: Payer: Self-pay

## 2014-10-04 ENCOUNTER — Encounter: Payer: Self-pay | Admitting: Internal Medicine

## 2015-08-06 ENCOUNTER — Other Ambulatory Visit: Payer: Self-pay | Admitting: Internal Medicine

## 2015-08-09 NOTE — Telephone Encounter (Signed)
Medications filled for 30 days.  Pt is past due for CPX.  Will get her scheduled.  What lab work do you want?

## 2015-08-10 NOTE — Telephone Encounter (Signed)
cbcdiff  and cmp i dont think she has insurance

## 2015-08-15 ENCOUNTER — Other Ambulatory Visit: Payer: Self-pay | Admitting: Internal Medicine

## 2015-09-13 ENCOUNTER — Encounter: Payer: Self-pay | Admitting: Internal Medicine

## 2015-09-13 ENCOUNTER — Ambulatory Visit (INDEPENDENT_AMBULATORY_CARE_PROVIDER_SITE_OTHER): Payer: Self-pay | Admitting: Internal Medicine

## 2015-09-13 VITALS — BP 136/94 | Temp 98.2°F | Ht 59.5 in | Wt 113.3 lb

## 2015-09-13 DIAGNOSIS — Z599 Problem related to housing and economic circumstances, unspecified: Secondary | ICD-10-CM

## 2015-09-13 DIAGNOSIS — Z598 Other problems related to housing and economic circumstances: Secondary | ICD-10-CM

## 2015-09-13 DIAGNOSIS — R55 Syncope and collapse: Secondary | ICD-10-CM

## 2015-09-13 DIAGNOSIS — Z5989 Other problems related to housing and economic circumstances: Secondary | ICD-10-CM

## 2015-09-13 DIAGNOSIS — R10811 Right upper quadrant abdominal tenderness: Secondary | ICD-10-CM

## 2015-09-13 DIAGNOSIS — R112 Nausea with vomiting, unspecified: Secondary | ICD-10-CM

## 2015-09-13 NOTE — Patient Instructions (Addendum)
Uncertain cause of your syncope but may have a GI component . And needs further evaluation.    notify us i want to proceed with lab woo .  Would also  get an abdominal ultrasound.  Gi  CARDS or neuro causes  eval involved. But will see what this shows   Make sure hydrated  Protein breakfast   .   Cannot say what triggered this so not obious how to treat it. howef would   Could try  Acid blocker incase nausea is gastritis causing  Nausea and vagal reaction.   Disability  Evaluations  For ss are usually done after after appilication and evaluation but specific doctor assigned for this . You have to check into this .     Syncope Syncope is a medical term for fainting or passing out. This means you lose consciousness and drop to the ground. People are generally unconscious for less than 5 minutes. You may have some muscle twitches for up to 15 seconds before waking up and returning to normal. Syncope occurs more often in older adults, but it can happen to anyone. While most causes of syncope are not dangerous, syncope can be a sign of a serious medical problem. It is important to seek medical care.  CAUSES  Syncope is caused by a sudden drop in blood flow to the brain. The specific cause is often not determined. Factors that can bring on syncope include:  Taking medicines that lower blood pressure.  Sudden changes in posture, such as standing up quickly.  Taking more medicine than prescribed.  Standing in one place for too long.  Seizure disorders.  Dehydration and excessive exposure to heat.  Low blood sugar (hypoglycemia).  Straining to have a bowel movement.  Heart disease, irregular heartbeat, or other circulatory problems.  Fear, emotional distress, seeing blood, or severe pain. SYMPTOMS  Right before fainting, you may:  Feel dizzy or light-headed.  Feel nauseous.  See all white or all black in your field of vision.  Have cold, clammy skin. DIAGNOSIS  Your health care  provider will ask about your symptoms, perform a physical exam, and perform an electrocardiogram (ECG) to record the electrical activity of your heart. Your health care provider may also perform other heart or blood tests to determine the cause of your syncope which may include:  Transthoracic echocardiogram (TTE). During echocardiography, sound waves are used to evaluate how blood flows through your heart.  Transesophageal echocardiogram (TEE).  Cardiac monitoring. This allows your health care provider to monitor your heart rate and rhythm in real time.  Holter monitor. This is a portable device that records your heartbeat and can help diagnose heart arrhythmias. It allows your health care provider to track your heart activity for several days, if needed.  Stress tests by exercise or by giving medicine that makes the heart beat faster. TREATMENT  In most cases, no treatment is needed. Depending on the cause of your syncope, your health care provider may recommend changing or stopping some of your medicines. HOME CARE INSTRUCTIONS  Have someone stay with you until you feel stable.  Do not drive, use machinery, or play sports until your health care provider says it is okay.  Keep all follow-up appointments as directed by your health care provider.  Lie down right away if you start feeling like you might faint. Breathe deeply and steadily. Wait until all the symptoms have passed.  Drink enough fluids to keep your urine clear or pale yellow.  If you are taking blood pressure or heart medicine, get up slowly and take several minutes to sit and then stand. This can reduce dizziness. SEEK IMMEDIATE MEDICAL CARE IF:   You have a severe headache.  You have unusual pain in the chest, abdomen, or back.  You are bleeding from your mouth or rectum, or you have black or tarry stool.  You have an irregular or very fast heartbeat.  You have pain with breathing.  You have repeated fainting or  seizure-like jerking during an episode.  You faint when sitting or lying down.  You have confusion.  You have trouble walking.  You have severe weakness.  You have vision problems. If you fainted, call your local emergency services (911 in [place] Do not drive yourself to the hospital.    This information is not intended to replace advice given to you by your health care provider. Make sure you discuss any questions you have with your health care provider.   Document Released: 11/17/2005 Document Revised: 04/03/2015 Document Reviewed: 01/16/2012 Elsevier Interactive Patient Education [employer]2016 [employer]

## 2015-09-13 NOTE — Progress Notes (Signed)
Pre visit review using our clinic review tool, if applicable. No additional management support is needed unless otherwise documented below in the visit note.  Chief Complaint  Patient presents with  . Fainting  . Dizziness    HPI: Patient Nichole [name]  comes in today for SDA for  new problem evaluation.  Hx of anemia pancreatitis felt to be etoh HT with hx ofo dizziness  In past b12 low  And no health insurance  Last seen July 2015   Working now Conservation officer, naturecashier  Weak hot and sweating episodes and faint episode   Had  Faint.    3 x happened.   So far Sent home yesterday. About 2 15  And got feeling  . Weak  feeling comes and goes .  Voice changes . And sleepy and after .   Tobacco  About 1/3 ppd   Etoh. Not  any Eating breakfast   Eats candy   caffiene  Every once and a while.  Vomits after fainting .  Feels hot first.  And dec vision non diplopia  And has to lay down and very tored after the episode .   No neuro sx potherwise . No drop attacks opr tachy  Stopped bp med injune inc case causing the problem .  But no change   . No racing heart.   Hx bells palsy in past  Getting evicted .  Needs new place to live in 2-3 weeks.  ROS: See pertinent positives and negatives per HPI.  Past Medical History  Diagnosis Date  . ANEMIA, B12 DEFICIENCY 04/05/2009  . 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 TOBACCO USE 01/25/2008  . 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 HYPERTENSION 01/29/2008  . EXCESSIVE MENSTRUAL BLEEDING 03/18/2010    gyne eval and bx neg  . PERIMENOPAUSAL SYNDROME 08/23/2009  . LEG PAIN, BILATERAL 06/22/2008  . SYNCOPE 01/02/2009  . SWEATING 09/23/2010  . Palpitations 01/02/2009  . THYROID FUNCTION TEST, ABNORMAL 03/19/2009  . Acute pancreatitis 12/05/2010    felt etoh related   . WEIGHT LOSS-ABNORMAL 12/05/2010  . GASTROINTESTINAL XRAY, ABNORMAL 12/16/2010  . RASH 12/23/2010    Family History  Problem Relation Age of Onset  . Hypertension Mother   .  Kidney disease Mother     removed for cyst  . Other Mother     overweight  . Heart attack Father   . Stroke Father     died 7570  . Hypertension Brother   . Neuromuscular disorder      spinomuscular atrophy    Social History   Social History  . Marital Status: Single    Spouse Name: N/A  . Number of Children: N/A  . Years of Education: N/A   Social History Main Topics  . Smoking status: Current Every Day Smoker    Types: Cigarettes  . Smokeless tobacco: Never Used     Comment: less than 1/2 pack  . Alcohol Use: No  . Drug Use: No  . Sexual Activity: Not Asked   Other Topics Concern  . 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 amLODipine (NORVASC) 5 MG tablet TAKE ONE TABLET BY MOUTH ONCE DAILY (Patient not taking: Reported on 09/13/2015) 30 tablet 0  . amLODipine (NORVASC) 5 MG tablet TAKE ONE TABLET BY MOUTH ONCE DAILY (Patient not taking: Reported on 09/13/2015) 30 tablet 2  . folic acid (FOLVITE) 1 MG tablet Take 1 tablet (1 mg total) by mouth daily. (Patient not taking: Reported on 09/13/2015) 30 tablet 6  . metoprolol (LOPRESSOR) 50 MG tablet TAKE ONE-HALF TABLET BY MOUTH TWICE DAILY (Patient not taking: Reported on 09/13/2015) 30 tablet 0  . metoprolol (LOPRESSOR) 50 MG tablet TAKE ONE-HALF TABLET BY MOUTH TWICE DAILY (Patient not taking: Reported on 09/13/2015) 30 tablet 2   No facility-administered medications prior to visit.     EXAM:  BP 136/94 mmHg  Temp(Src) 98.2 F (36.8 C) (Oral)  Ht 4' 11.5" (1.511 m)  Wt 113 lb 4.8 oz (51.393 kg)  BMI 22.51 kg/m2  Body mass index is 22.51 kg/(m^2).  GENERAL: vitals  reviewed and listed above, alert, oriented, appears well hydrated and in no acute distress HEENT: atraumatic, conjunctiva  clear, no obvious abnormalities on inspection of external nose and ears OP : no lesion edema or exudate  Dental some missing NECK: no obvious masses on inspection palpation  LUNGS: clear to auscultation bilaterally, no wheezes, rales or rhonchi,  CV: HRRR, no clubbing cyanosis or  peripheral edema nl cap refill  abd tender ruq  And mid abd mass effect?  No g or r  bs persent  MS: moves all extremities without noticeable focal  abnormality PSYCH: pleasant and cooperative, no obvious depression or anxiety Skin no bleeding bruising  Neuro cn 3-12 ok  No obv focal deficits  Gait intact  ASSESSMENT AND PLAN:  Discussed the following assessment and plan:  Syncope and collapse - Plan: EKG 12-Lead, CANCELED: Comprehensive metabolic panel, CANCELED: CBC with Differential/Platelet, CANCELED: Sedimentation rate, CANCELED: POCT urinalysis dipstick, CANCELED: TSH  Nausea and vomiting, intractability of vomiting not specified, unspecified vomiting type - Plan: CANCELED: Comprehensive metabolic panel, CANCELED: CBC with Differential/Platelet, CANCELED: Sedimentation rate, CANCELED: POCT urinalysis dipstick, CANCELED: TSH  RUQ abdominal tenderness  Does not have health insurance  Financial difficulties Discussed at length options hard to tell what is going on. She could have food insecurity that's adding to this scenario but has risk of other disease. Her EKG which we will not charge for has some minor changes from last done a few years ago lead 3 and anterior forces and V3 but no cues. We canceled her blood work today because of her concern about cost perhaps looking into [employer] or administering the short run to see if she can get help but apparently in the past has been denied Medicaid because "she didn't have children."   -Patient advised to return or notify health care team   if symptoms worsen ,persist or new concerns arise.  Patient Instructions  Uncertain cause of your syncope but may have a GI component . And needs further evaluation.    notify us i want to proceed with lab woo .  Would also  get an abdominal ultrasound.  Gi  CARDS or neuro causes  eval involved. But will see what this shows   Make sure hydrated  Protein breakfast   .  Cannot say what triggered this so not obious how to treat it. howef would   Could try  Acid blocker incase nausea is gastritis causing  Nausea and vagal reaction.   Disability  Evaluations  For ss are usually done after after appilication and evaluation but specific doctor assigned for this . You have to check into this .     Syncope Syncope is a medical term for fainting or passing out. This means you lose consciousness and drop to the ground. People are generally unconscious for less than 5 minutes. You may have some muscle twitches for up to 15 seconds before waking up and returning to normal. Syncope occurs more often in older adults, but it can happen to anyone. While most causes of syncope are not dangerous, syncope can be a sign of a serious medical problem. It is important to seek medical care.  CAUSES  Syncope is caused by a sudden drop in blood flow to the brain. The specific cause is often not determined. Factors that can bring on syncope include:  Taking medicines that lower blood pressure.  Sudden changes in posture, such as standing up quickly.  Taking more medicine than prescribed.  Standing in one place for too long.  Seizure disorders.  Dehydration and excessive exposure to heat.  Low blood sugar (hypoglycemia).  Straining to have a bowel movement.  Heart disease, irregular heartbeat, or other circulatory problems.  Fear, emotional distress, seeing blood, or severe pain. SYMPTOMS  Right before fainting, you may:  Feel dizzy or light-headed.  Feel nauseous.  See all white or all black in  your field of vision.  Have cold, clammy skin. DIAGNOSIS  Your health care provider will ask about your symptoms, perform a physical exam, and perform an electrocardiogram (ECG) to record the electrical activity of your heart. Your health care provider may also perform other heart or blood tests to determine the cause of your syncope which may include:  Transthoracic echocardiogram (TTE). During echocardiography, sound waves are used to evaluate how blood flows through your heart.  Transesophageal echocardiogram (TEE).  Cardiac monitoring. This allows your health care provider to monitor your heart rate and rhythm in real time.  Holter monitor. This is a portable device that records your heartbeat and can help diagnose heart arrhythmias. It allows your health care provider to track your heart activity for several days, if needed.  Stress tests by exercise or by giving medicine that makes the heart beat faster. TREATMENT  In most cases, no treatment is needed. Depending on the cause of your syncope, your health care provider may recommend changing or stopping some of your medicines. HOME CARE INSTRUCTIONS  Have someone stay with you until you feel stable.  Do not drive, use machinery, or play sports until your health care provider says it is okay.  Keep all follow-up appointments as directed by your health care provider.  Lie down right away if you start feeling like you might faint. Breathe deeply and steadily. Wait until all the symptoms have passed.  Drink enough fluids to keep your urine clear or pale yellow.  If you are taking blood pressure or heart medicine, get up slowly and take several minutes to sit and then stand. This can reduce dizziness. SEEK IMMEDIATE MEDICAL CARE IF:   You have a severe headache.  You have unusual pain in the chest, abdomen, or back.  You are bleeding from your mouth or rectum, or you have black or tarry stool.  You  have an irregular or very fast  heartbeat.  You have pain with breathing.  You have repeated fainting or seizure-like jerking during an episode.  You faint when sitting or lying down.  You have confusion.  You have trouble walking.  You have severe weakness.  You have vision problems. If you fainted, call your local emergency services (911 in [place] Do not drive yourself to the hospital.    This information is not intended to replace advice given to you by your health care provider. Make sure you discuss any questions you have with your health care provider.   Document Released: 11/17/2005 Document Revised: 04/03/2015 Document Reviewed: 01/16/2012 Elsevier Interactive Patient Education 2016 [employer].    [place]. Addison Whidbee M.D.

## 2016-01-30 DIAGNOSIS — K920 Hematemesis: Secondary | ICD-10-CM

## 2016-01-30 HISTORY — DX: Hematemesis: K92.0

## 2016-02-27 ENCOUNTER — Emergency Department (HOSPITAL_COMMUNITY): Payer: Self-pay

## 2016-02-27 ENCOUNTER — Encounter (HOSPITAL_COMMUNITY): Payer: Self-pay | Admitting: Emergency Medicine

## 2016-02-27 ENCOUNTER — Inpatient Hospital Stay (HOSPITAL_COMMUNITY)
Admission: EM | Admit: 2016-02-27 | Discharge: 2016-02-29 | DRG: 439 | Disposition: A | Discharge status: Home / Self Care | Payer: Self-pay | Attending: Internal Medicine | Admitting: Internal Medicine

## 2016-02-27 DIAGNOSIS — I10 Essential (primary) hypertension: Secondary | ICD-10-CM | POA: Diagnosis present

## 2016-02-27 DIAGNOSIS — F1010 Alcohol abuse, uncomplicated: Secondary | ICD-10-CM

## 2016-02-27 DIAGNOSIS — Z8249 Family history of ischemic heart disease and other diseases of the circulatory system: Secondary | ICD-10-CM

## 2016-02-27 DIAGNOSIS — D539 Nutritional anemia, unspecified: Secondary | ICD-10-CM | POA: Diagnosis present

## 2016-02-27 DIAGNOSIS — Z841 Family history of disorders of kidney and ureter: Secondary | ICD-10-CM

## 2016-02-27 DIAGNOSIS — Z823 Family history of stroke: Secondary | ICD-10-CM

## 2016-02-27 DIAGNOSIS — K922 Gastrointestinal hemorrhage, unspecified: Secondary | ICD-10-CM | POA: Diagnosis present

## 2016-02-27 DIAGNOSIS — Z888 Allergy status to other drugs, medicaments and biological substances status: Secondary | ICD-10-CM

## 2016-02-27 DIAGNOSIS — K860 Alcohol-induced chronic pancreatitis: Secondary | ICD-10-CM | POA: Diagnosis present

## 2016-02-27 DIAGNOSIS — F1020 Alcohol dependence, uncomplicated: Secondary | ICD-10-CM | POA: Diagnosis present

## 2016-02-27 DIAGNOSIS — K920 Hematemesis: Secondary | ICD-10-CM | POA: Diagnosis present

## 2016-02-27 DIAGNOSIS — Z79899 Other long term (current) drug therapy: Secondary | ICD-10-CM

## 2016-02-27 DIAGNOSIS — G8929 Other chronic pain: Secondary | ICD-10-CM | POA: Diagnosis present

## 2016-02-27 DIAGNOSIS — F17200 Nicotine dependence, unspecified, uncomplicated: Secondary | ICD-10-CM | POA: Diagnosis present

## 2016-02-27 DIAGNOSIS — K859 Acute pancreatitis without necrosis or infection, unspecified: Secondary | ICD-10-CM | POA: Diagnosis present

## 2016-02-27 DIAGNOSIS — K222 Esophageal obstruction: Secondary | ICD-10-CM | POA: Diagnosis present

## 2016-02-27 DIAGNOSIS — K852 Alcohol induced acute pancreatitis without necrosis or infection: Principal | ICD-10-CM | POA: Diagnosis present

## 2016-02-27 DIAGNOSIS — F17210 Nicotine dependence, cigarettes, uncomplicated: Secondary | ICD-10-CM | POA: Diagnosis present

## 2016-02-27 DIAGNOSIS — K863 Pseudocyst of pancreas: Secondary | ICD-10-CM | POA: Diagnosis present

## 2016-02-27 DIAGNOSIS — D7589 Other specified diseases of blood and blood-forming organs: Secondary | ICD-10-CM | POA: Diagnosis present

## 2016-02-27 HISTORY — DX: Hematemesis: K92.0

## 2016-02-27 LAB — COMPREHENSIVE METABOLIC PANEL
ALK PHOS: 54 U/L (ref 38–126)
ALT: 11 U/L — AB (ref 14–54)
AST: 18 U/L (ref 15–41)
Albumin: 3.4 g/dL — ABNORMAL LOW (ref 3.5–5.0)
Anion gap: 9 (ref 5–15)
BUN: 10 mg/dL (ref 6–20)
CALCIUM: 8.7 mg/dL — AB (ref 8.9–10.3)
CO2: 27 mmol/L (ref 22–32)
CREATININE: 0.44 mg/dL (ref 0.44–1.00)
Chloride: 107 mmol/L (ref 101–111)
GFR calc non Af Amer: >60 mL/min (ref >60)
GLUCOSE: 110 mg/dL — AB (ref 65–99)
Potassium: 4.2 mmol/L (ref 3.5–5.1)
SODIUM: 143 mmol/L (ref 135–145)
Total Bilirubin: 0.7 mg/dL (ref 0.3–1.2)
Total Protein: 6.7 g/dL (ref 6.5–8.1)

## 2016-02-27 LAB — URINALYSIS, ROUTINE W REFLEX MICROSCOPIC
Bilirubin Urine: NEGATIVE
Glucose, UA: NEGATIVE mg/dL
Ketones, ur: 40 mg/dL — AB
Leukocytes, UA: NEGATIVE
Nitrite: NEGATIVE
Protein, ur: 100 mg/dL — AB
Specific Gravity, Urine: 1.012 (ref 1.005–1.030)
pH: 7 (ref 5.0–8.0)

## 2016-02-27 LAB — CBC WITH DIFFERENTIAL/PLATELET
Basophils Absolute: 0 10*3/uL (ref 0.0–0.1)
Basophils Relative: 0 %
Eosinophils Absolute: 0 10*3/uL (ref 0.0–0.7)
Eosinophils Relative: 0 %
HEMATOCRIT: 45.4 % (ref 36.0–46.0)
HEMOGLOBIN: 15.2 g/dL — AB (ref 12.0–15.0)
LYMPHS ABS: 0.5 10*3/uL — AB (ref 0.7–4.0)
LYMPHS PCT: 7 %
MCH: 35.1 pg — AB (ref 26.0–34.0)
MCHC: 33.5 g/dL (ref 30.0–36.0)
MCV: 104.8 fL — ABNORMAL HIGH (ref 78.0–100.0)
Monocytes Absolute: 0.5 10*3/uL (ref 0.1–1.0)
Monocytes Relative: 7 %
NEUTROS ABS: 6.4 10*3/uL (ref 1.7–7.7)
NEUTROS PCT: 86 %
Platelets: 262 10*3/uL (ref 150–400)
RBC: 4.33 MIL/uL (ref 3.87–5.11)
RDW: 13.9 % (ref 11.5–15.5)
WBC: 7.6 10*3/uL (ref 4.0–10.5)

## 2016-02-27 LAB — POC OCCULT BLOOD, ED: Fecal Occult Bld: POSITIVE — AB

## 2016-02-27 LAB — URINE MICROSCOPIC-ADD ON

## 2016-02-27 LAB — PROTIME-INR
INR: 1.07 (ref 0.00–1.49)
Prothrombin Time: 14.1 seconds (ref 11.6–15.2)

## 2016-02-27 LAB — HEMOGLOBIN AND HEMATOCRIT, BLOOD
HEMATOCRIT: 37.9 % (ref 36.0–46.0)
Hemoglobin: 12.6 g/dL (ref 12.0–15.0)

## 2016-02-27 LAB — LIPASE, BLOOD: Lipase: 864 U/L — ABNORMAL HIGH (ref 11–51)

## 2016-02-27 MED ORDER — ONDANSETRON HCL 4 MG PO TABS: 4.0000 mg | ORAL_TABLET | Freq: Four times a day (QID) | ORAL | Status: DC | PRN

## 2016-02-27 MED ORDER — ACETAMINOPHEN 650 MG RE SUPP: 650.0000 mg | Freq: Four times a day (QID) | RECTAL | Status: DC | PRN

## 2016-02-27 MED ORDER — IOPAMIDOL (ISOVUE-300) INJECTION 61%
100.0000 mL | Freq: Once | INTRAVENOUS | Status: AC | PRN
  Administered 2016-02-27: 100 mL via INTRAVENOUS

## 2016-02-27 MED ORDER — HYDROMORPHONE HCL 1 MG/ML IJ SOLN
1.0000 mg | INTRAMUSCULAR | Status: DC | PRN
Start: 2016-02-27 — End: 2016-02-27

## 2016-02-27 MED ORDER — HYDROMORPHONE HCL 1 MG/ML IJ SOLN
0.5000 mg | INTRAMUSCULAR | Status: DC | PRN
  Administered 2016-02-27: 0.5 mg via INTRAVENOUS
  Filled 2016-02-27: qty 1

## 2016-02-27 MED ORDER — SODIUM CHLORIDE 0.9 % IV SOLN
INTRAVENOUS | Status: DC
  Administered 2016-02-27 – 2016-02-28 (×4): via INTRAVENOUS

## 2016-02-27 MED ORDER — METOPROLOL TARTRATE 1 MG/ML IV SOLN
2.5000 mg | Freq: Four times a day (QID) | INTRAVENOUS | Status: DC
  Filled 2016-02-27 (×2): qty 5

## 2016-02-27 MED ORDER — IOHEXOL 300 MG/ML  SOLN
25.0000 mL | Freq: Once | INTRAMUSCULAR | Status: AC | PRN
  Administered 2016-02-27: 25 mL via ORAL

## 2016-02-27 MED ORDER — ACETAMINOPHEN 325 MG PO TABS: 650.0000 mg | ORAL_TABLET | Freq: Four times a day (QID) | ORAL | Status: DC | PRN

## 2016-02-27 MED ORDER — ONDANSETRON HCL 4 MG/2ML IJ SOLN: 4.0000 mg | Freq: Four times a day (QID) | INTRAMUSCULAR | Status: DC | PRN

## 2016-02-27 MED ORDER — SODIUM CHLORIDE 0.9 % IV BOLUS (SEPSIS)
1000.0000 mL | Freq: Once | INTRAVENOUS | Status: AC
  Administered 2016-02-27: 1000 mL via INTRAVENOUS

## 2016-02-27 MED ORDER — MORPHINE SULFATE (PF) 2 MG/ML IV SOLN: 2.0000 mg | INTRAVENOUS | Status: DC | PRN

## 2016-02-27 MED ORDER — ONDANSETRON HCL 4 MG/2ML IJ SOLN
4.0000 mg | Freq: Once | INTRAMUSCULAR | Status: AC
  Administered 2016-02-27: 4 mg via INTRAVENOUS
  Filled 2016-02-27: qty 2

## 2016-02-27 MED ORDER — SODIUM CHLORIDE 0.9 % IV SOLN
INTRAVENOUS | Status: DC
  Administered 2016-02-28: 10:00:00 via INTRAVENOUS

## 2016-02-27 NOTE — ED Notes (Signed)
Per EMS, patient is from home.  Patient is complaining of epigasteric pain.  She was awaken out of her sleep with pain and hot sweats.  Patient states she has n/v.    EMS administered 4 mg of zofran  BP: 174/96 HR:100 R:16  CBG:163

## 2016-02-27 NOTE — Consult Note (Signed)
Referring Provider: [facility], Dr. Catha Gosselin Primary Care Physician:  Jacklynn Barnacle, NP Primary Gastroenterologist:  Dr. Arlyce Dice  Reason for Consultation:  Pancreatitis, hematemesis/FOBT positive  HPI: Nichole [name] is a 55 y.o. female with a history of hypertension, pancreatitis/pseudocysts in 2012.  She presented to the emergency department at [facility] hospital this AM with complaints of abdominal pain. Patient states she's had abdominal pain over the last 2 months. She woke up this morning to worsening abdominal pain, nausea and vomiting. She stated her vomit was bright red/contained blood.  This occurred on 2 occasions. Patient stated her stool was dark brown in color when she had a BM yesterday, no black stools.  Patient does endorse alcohol use, only on the weekends, but has more extensive use in the past.  In the emergency department, lipase level 864, Hgb 15.2 grams.  MCV is high at 104.8 and has been extremely high in the past.  She was FOBT positive.  Current says that the pain is better after getting some pain medicine.  Saying that she would like to eat.  CT scan of the abdomen and pelvis with contrast showed the following:  "IMPRESSION: 1. Acute on chronic pancreatitis. 2. Minimal enlargement of a a complex "Mass" in the portacaval space since 2012. 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 There are no specific features to suggest pseudoaneurysm or aneurysm. However, if aspiration is considered, ultrasound characterization suggested to exclude unlikely vascular etiology. 3. Small volume cul-de-sac fluid is minimally complex and may be secondary to pancreatitis. 4. Significantly age advanced atherosclerosis. 5. Suspicion of the uterine fundal fibroid. 6. No calcified gallstones. The common duct is not well evaluated secondary to mass effect from the dominant mass. No intrahepatic ductal dilatation to strongly suggest biliary obstruction. 7. Gastric underdistention with suspicion of concurrent  gastric wall thickening. Correlate with symptoms of gastritis."  Never had any endoscopic evaluation in the past.     Past Medical History  Diagnosis Date  . ANEMIA, B12 DEFICIENCY 04/05/2009  . 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 TOBACCO USE 01/25/2008  . 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 HYPERTENSION 01/29/2008  . EXCESSIVE MENSTRUAL BLEEDING 03/18/2010    gyne eval and bx neg  . PERIMENOPAUSAL SYNDROME 08/23/2009  . LEG PAIN, BILATERAL 06/22/2008  . SYNCOPE 01/02/2009  . SWEATING 09/23/2010  . Palpitations 01/02/2009  . THYROID FUNCTION TEST, ABNORMAL 03/19/2009  . Acute pancreatitis 12/05/2010    felt etoh related   . WEIGHT LOSS-ABNORMAL 12/05/2010  . GASTROINTESTINAL XRAY, ABNORMAL 12/16/2010  . RASH 12/23/2010    Past Surgical History  Procedure Laterality Date  . Knee surgery      Prior to Admission medications   Medication Sig Start Date End Date Taking? Authorizing Provider  metoprolol tartrate (LOPRESSOR) 25 MG tablet Take 25 mg by mouth 2 (two) times daily.   Yes Historical Provider, MD    Current Facility-Administered Medications  Medication Dose Route Frequency Provider Last Rate Last Dose  . 0.9 %  sodium chloride infusion   Intravenous Continuous Linwood Dibbles, MD   Stopped at 02/27/16 1250  . HYDROmorphone (DILAUDID) injection 0.5 mg  0.5 mg Intravenous Q30 min PRN Linwood Dibbles, MD   0.5 mg at 02/27/16 1020   Current Outpatient Prescriptions  Medication Sig Dispense Refill  . metoprolol tartrate (LOPRESSOR) 25 MG tablet Take 25 mg by mouth 2 (two) times daily.      Allergies as of 02/27/2016 -  Review Complete 02/27/2016  Allergen Reaction Noted  . Lisinopril  08/06/2010    Family History  Problem Relation Age of Onset  . Hypertension Mother   . Kidney disease Mother     removed for cyst  . Other Mother     overweight  . Heart attack Father   . Stroke Father     died 37  . Hypertension Brother   . Neuromuscular disorder       spinomuscular atrophy    Social History   Social History  . Marital Status: Single    Spouse Name: N/A  . Number of Children: N/A  . Years of Education: N/A   Occupational History  . Not on file.   Social History Main Topics  . Smoking status: Current Every Day Smoker    Types: Cigarettes  . Smokeless tobacco: Never Used     Comment: less than 1/2 pack  . Alcohol Use: No  . Drug Use: No  . Sexual Activity: Not on file   Other Topics Concern  . 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  Physical Exam: Vital signs in last 24 hours: Temp:  [97.8 F (36.6 C)] 97.8 F (36.6 C) (03/29 1347) Pulse Rate:  [72-101] 72 (03/29 1347) Resp:  [16-18] 18 (03/29 1347) BP: (148-181)/(71-111) 148/71 mmHg (03/29 1347) SpO2:  [99 %-100 %] 99 % (03/29 1347) Weight:  [108 lb (48.988 kg)] 108 lb (48.988 kg) (03/29 0935)   General:  Alert, thin, pleasant and cooperative in NAD Head:  Normocephalic and atraumatic. Eyes:  Sclera clear, no icterus.  Conjunctiva pink. Ears:  Normal auditory acuity. Mouth:  No deformity or lesions.   Neck:  Supple; no masses or thyromegaly. Lungs:  Clear throughout to auscultation.   No wheezes, crackles, or rhonchi. Heart:  Regular rate and rhythm; no murmurs, clicks, rubs, or gallops. Abdomen:  Soft, non-distended.  BS present.  Epigastric TTP. Rectal:  Deferred.  Was hemoccult positive in the ED.  Msk:  Symmetrical without gross deformities.  Pulses:  Normal pulses noted. Extremities:  Without clubbing or edema. Neurologic:  Alert and  oriented x4;  grossly normal neurologically. Skin:  Intact without  significant lesions or rashes. Psych:  Alert and cooperative. Normal mood and affect.  Lab Results:  Recent Labs  02/27/16 1013  WBC 7.6  HGB 15.2*  HCT 45.4  PLT 262   BMET  Recent Labs  02/27/16 1057  NA 143  K 4.2  CL 107  CO2 27  GLUCOSE 110*  BUN 10  CREATININE 0.44  CALCIUM 8.7*   LFT  Recent Labs  02/27/16 1057  PROT 6.7  ALBUMIN 3.4*  AST 18  ALT 11*  ALKPHOS 54  BILITOT 0.7   PT/INR  Recent Labs  02/27/16 1057  LABPROT 14.1  INR 1.07   Studies/Results: Ct Abdomen Pelvis W Contrast  02/27/2016  CLINICAL DATA:  Epigastric pain since this morning.  Nausea and vomiting. Question pancreatitis. History prior pancreatitis. EXAM: CT ABDOMEN AND PELVIS WITH CONTRAST TECHNIQUE: Multidetector CT imaging of the abdomen and pelvis was performed using the standard protocol following bolus administration of intravenous contrast. CONTRAST:  ISOVUE-300 IOPAMIDOL (ISOVUE-300) INJECTION 61% COMPARISON:  Plain film of earlier today and CT of 01/01/2011 FINDINGS: Lower chest: Clear lung bases. Normal heart size without pericardial or pleural effusion. Hepatobiliary: Normal liver. Normal gallbladder. No intrahepatic biliary duct dilatation. The pancreatic duct is not well visualized secondary mass effect by the dominant porta hepatis lesion. No intrahepatic duct dilatation identified. Pancreas: Mild pancreatic atrophy. Pancreatic parenchymal calcifications, consistent with chronic calcific pancreatitis. Moderate peripancreatic edema throughout. Pancreas enhances normally. The pancreatic head is displaced anteriorly by a complex lesion within the portocaval space which measures 4.5 x 6.1 cm on image 18/series 2. This is at the site of a 1.9 x 2.0 cm lesion on 01/01/2011. Although there is hyperattenuation about the medial portion of this lesion, there is no enhancement to strongly suggest pseudoaneurysm. Spleen: Normal in size, without focal abnormality. Adrenals/Urinary  Tract: Normal adrenal glands. Normal kidneys, without hydronephrosis. Normal urinary bladder. Stomach/Bowel: Proximal gastric underdistention. Probable concurrent gastric wall thickening, including a 2.7 cm greater curvature on image 8/series 2. The gastric antrum is displaced anteriorly, but not obstructed. Normal colon and terminal ileum. Normal small bowel. Vascular/Lymphatic: Advanced aortic and branch vessel atherosclerosis. No splenic artery pseudoaneurysm. No abdominopelvic adenopathy. Reproductive: Retroverted uterus. Suspect a fundal fibroid, including image 54/series 2. No adnexal mass. Other: Small volume cul-de-sac fluid is minimally complex and may be secondary to pancreatitis. No significant abdominal ascites. Musculoskeletal: No acute osseous abnormality. IMPRESSION: 1. Acute on chronic pancreatitis. 2. Minimal enlargement of a a complex "Mass" in the portacaval space since 2012. 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 There are no specific features to suggest pseudoaneurysm or aneurysm. However, if aspiration is considered, ultrasound characterization suggested to exclude unlikely vascular etiology. 3. Small volume cul-de-sac fluid is minimally complex and may be secondary to pancreatitis. 4. Significantly age advanced atherosclerosis. 5. Suspicion of the uterine fundal fibroid. 6. No calcified gallstones. The common duct is not well evaluated secondary to mass effect from the dominant mass. No intrahepatic ductal dilatation to strongly suggest biliary obstruction. 7. Gastric underdistention with suspicion of concurrent gastric wall thickening. Correlate with symptoms of gastritis. Electronically Signed   By: Jeronimo Greaves M.D.   On: 02/27/2016 15:11   Dg Abd Acute W/chest  02/27/2016  CLINICAL DATA:  O woke this morning with moderate to severe mid abdominal pain, nausea, vomiting. EXAM: DG ABDOMEN ACUTE W/ 1V CHEST COMPARISON:  Abdomen CT 01/01/2011.  Chest x-ray 02/13/2008 FINDINGS: Hyperinflation of  the lungs. No focal opacities or effusions. Heart is normal size. Calcifications noted in the right medial abdomen medial to the right kidney. This could be within the pancreatic head region. Nonobstructive bowel gas pattern. No free air organomegaly. No acute bony abnormality. IMPRESSION: Calcifications in the medial right abdomen which could be within the region of the pancreatic head. No evidence of bowel obstruction or free air. Hyperinflation of the lungs.  No active cardiopulmonary disease. Electronically Signed   By: Charlett Nose M.D.   On: 02/27/2016 09:57   IMPRESSION:  -Acute on chronic pancreatitis with what appears to be a complex pseudocyst:  Secondary to ETOH.  Lipase 864. -Hematemesis with hemoccult positive stools:  Hgb is normal.  CT scan also suggests some gastric wall thickening. -Macrocytosis:  MCV 104.8.  Folate was low in  the past.  PLAN: -Standard supportive care for pancreatitis with IVF's, bowel rest (ok for clear liquids if tolerated), anti-emetics, pain control). -Monitor labs. -Will plan for EGD 3/30.  ZEHR, JESSICA D.  02/27/2016, 3:21 PM  Pager number [phone]     Attending physician's note   I have taken a history, examined the patient and reviewed the chart. I agree with the Advanced Practitioner's note, impression and recommendations. Acute on chronic alcoholic pancreatitis presented with hematemesis. Hgb 15, likely hemoconcentration. IV fluids and supportive care. Will plan for EGD tomorrow AM. PPI BID. NPO after midnight. Ok to start clears if able to tolerate.   Iona BeardK Veena Lauris Keepers, MD [phone] Mon-Fri 8a-5p [phone] after 5p, weekends, holidays

## 2016-02-27 NOTE — ED Notes (Signed)
Bed: WA01 Expected date:  Expected time:  Means of arrival:  Comments: EMS-abdominal pain 

## 2016-02-27 NOTE — Progress Notes (Signed)
Pt [facility] Chales AbrahamsMary ann Placey as pcp ? Homeless but has an address listed and ED nursing states pt came from home via EMS Went to assess and provide resources to pt but she is not in the room at this time

## 2016-02-27 NOTE — ED Provider Notes (Signed)
CSN: [id number]     Arrival date & time 02/27/16  0906 History   First MD Initiated Contact with Patient 02/27/16 0915     Chief Complaint  Patient presents with  . Abdominal Pain    HPI Pt woke up this am with abdominal pain.    The pain is aching in the upper abdomen.  It is around the umbilicus and radiates upward.  The pain is severe. She has vomited this am.  Initially it looked foamy and pink and then she noticed blood.  No diarrhea.  No blood in the stool.  Medical records indicate she has a history of pancreatitis. Patient does not recall this episode. She occasionally drinks alcohol on the weekends. Denies any history of prior abdominal surgery.   Past Medical History  Diagnosis Date  . ANEMIA, B12 DEFICIENCY 04/05/2009  . 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 TOBACCO USE 01/25/2008  . 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 HYPERTENSION 01/29/2008  . EXCESSIVE MENSTRUAL BLEEDING 03/18/2010    gyne eval and bx neg  . PERIMENOPAUSAL SYNDROME 08/23/2009  . LEG PAIN, BILATERAL 06/22/2008  . SYNCOPE 01/02/2009  . SWEATING 09/23/2010  . Palpitations 01/02/2009  . THYROID FUNCTION TEST, ABNORMAL 03/19/2009  . Acute pancreatitis 12/05/2010    felt etoh related   . WEIGHT LOSS-ABNORMAL 12/05/2010  . GASTROINTESTINAL XRAY, ABNORMAL 12/16/2010  . RASH 12/23/2010   Past Surgical History  Procedure Laterality Date  . Knee surgery     Family History  Problem Relation Age of Onset  . Hypertension Mother   . Kidney disease Mother     removed for cyst  . Other Mother     overweight  . Heart attack Father   . Stroke Father     died 36  . Hypertension Brother   . Neuromuscular disorder      spinomuscular atrophy   Social History  Substance Use Topics  . Smoking status: Current Every Day Smoker    Types: Cigarettes  . Smokeless tobacco: Never Used     Comment: less than 1/2 pack  . Alcohol Use: No   OB History    Gravida Para Term Preterm AB TAB SAB Ectopic  Multiple Living       Review of Systems  Constitutional: Negative for fever.  Respiratory: Negative for shortness of breath.   Cardiovascular: Negative for chest pain.  Genitourinary: Negative for dysuria.  All other systems reviewed and are negative.     Allergies  Lisinopril  Home Medications   Prior to Admission medications   Medication Sig Start Date End Date Taking? Authorizing Provider  metoprolol tartrate (LOPRESSOR) 25 MG tablet Take 25 mg by mouth 2 (two) times daily.   Yes Historical Provider, MD   BP 148/71 mmHg  Pulse 72  Temp(Src) 97.8 F (36.6 C) (Oral)  Resp 18  Ht 5' (1.524 m)  Wt 48.988 kg  BMI 21.09 kg/m2  SpO2 99% Physical Exam  Constitutional: She appears distressed.  Underweight, thin  HENT:  Head: Normocephalic and atraumatic.  Right Ear: External ear normal.  Left Ear: External ear normal.  Poor dentition  Eyes: Conjunctivae are normal. Right eye exhibits no discharge. Left eye exhibits no discharge. No scleral icterus.  Neck: Neck supple. No tracheal deviation present.  Cardiovascular: Normal rate, regular rhythm and intact distal pulses.   Pulmonary/Chest: Effort normal and  breath sounds normal. No stridor. No respiratory distress. She has no wheezes. She has no rales.  Abdominal: Soft. Bowel sounds are normal. She exhibits no distension and no mass. There is tenderness in the epigastric area. There is guarding. There is no rigidity and no rebound. No hernia.  Scaphoid abdomen  Musculoskeletal: She exhibits no edema or tenderness.  Neurological: She is alert. She has normal strength. No cranial nerve deficit (no facial droop, extraocular movements intact, no slurred speech) or sensory deficit. She exhibits normal muscle tone. She displays no seizure activity. Coordination normal.  Skin: Skin is warm and dry. No rash noted.  Psychiatric: She has a normal mood and affect.  Nursing note and vitals reviewed.   ED Course   Procedures (including critical care time) Labs Review Labs Reviewed  CBC WITH DIFFERENTIAL/PLATELET - Abnormal; Notable for the following:    Hemoglobin 15.2 (*)    MCV 104.8 (*)    MCH 35.1 (*)    Lymphs Abs 0.5 (*)    All other components within normal limits  URINALYSIS, ROUTINE W REFLEX MICROSCOPIC (NOT AT [facility]) - Abnormal; Notable for the following:    Hgb urine dipstick TRACE (*)    Ketones, ur 40 (*)    Protein, ur 100 (*)    All other components within normal limits  COMPREHENSIVE METABOLIC PANEL - Abnormal; Notable for the following:    Glucose, Bld 110 (*)    Calcium 8.7 (*)    Albumin 3.4 (*)    ALT 11 (*)    All other components within normal limits  LIPASE, BLOOD - Abnormal; Notable for the following:    Lipase 864 (*)    All other components within normal limits  URINE MICROSCOPIC-ADD ON - Abnormal; Notable for the following:    Squamous Epithelial / LPF 0-5 (*)    Bacteria, UA RARE (*)    All other components within normal limits  POC OCCULT BLOOD, ED - Abnormal; Notable for the following:    Fecal Occult Bld POSITIVE (*)    All other components within normal limits  PROTIME-INR    Imaging Review Dg Abd Acute W/chest  02/27/2016  CLINICAL DATA:  O woke this morning with moderate to severe mid abdominal pain, nausea, vomiting. EXAM: DG ABDOMEN ACUTE W/ 1V CHEST COMPARISON:  Abdomen CT 01/01/2011.  Chest x-ray 02/13/2008 FINDINGS: Hyperinflation of the lungs. No focal opacities or effusions. Heart is normal size. Calcifications noted in the right medial abdomen medial to the right kidney. This could be within the pancreatic head region. Nonobstructive bowel gas pattern. No free air organomegaly. No acute bony abnormality. IMPRESSION: Calcifications in the medial right abdomen which could be within the region of the pancreatic head. No evidence of bowel obstruction or free air. Hyperinflation of the lungs.  No active cardiopulmonary disease. Electronically Signed    By: Charlett Nose M.D.   On: 02/27/2016 09:57   I have personally reviewed and evaluated these images and lab results as part of my medical decision-making.  Medications  sodium chloride 0.9 % bolus 1,000 mL (0 mLs Intravenous Stopped 02/27/16 1250)    And  0.9 %  sodium chloride infusion ( Intravenous Stopped 02/27/16 1250)  HYDROmorphone (DILAUDID) injection 0.5 mg (0.5 mg Intravenous Given 02/27/16 1020)  ondansetron (ZOFRAN) injection 4 mg (4 mg Intravenous Given 02/27/16 1020)  iohexol (OMNIPAQUE) 300 MG/ML solution 25 mL (25 mLs Oral Contrast Given 02/27/16 1227)     MDM   Final diagnoses:  Acute  pancreatitis, unspecified complication status, unspecified pancreatitis type    Patient presented to the emergency room with complaints of severe upper abdominal pain. Her laboratory tests are notable for an elevated lipase. Her symptoms exam and laboratory findings are consistent with acute pancreatitis.  Patient does admit to occasional alcohol use. It's possible that she may be underestimating her alcohol consumption.  CT scan of the abdomen and pelvis has been ordered to evaluate for any other acute pathology.  Patient's fecal occult test was positive hours she remains normotensive and her hemoglobin is normal. Continue to monitor.  I will consult the medical service for admission and further treatment.    Linwood DibblesJon Onedia Vargus, MD 02/27/16 [phone]

## 2016-02-27 NOTE — H&P (Signed)
[facility] History and Physical  Nichole [name] ZOX:[id number] DOB: 1961/03/01 DOA: 02/27/2016  Referring physician: Dr. Linwood Dibbles, EDP PCP: Jacklynn Barnacle, NP  Specialists:   Chief Complaint: Abdominal pain  HPI: Nichole [name] is a 55 y.o. female  With a history of hypertension, pancreatitis in 2012, that presented to the emergency department with complaints of abdominal pain. Patient states she has had abdominal pain over the last 2 months. She will follow-up this morning to worsening abdominal pain, nausea and vomiting. She stated her vomit was bright red with pink frothiness. Patient stated her stool was dark brown in color.  Denies hemorrhoids or straining. Patient does endorse alcohol use, only on the weekends. Denies any recent travel or ill contacts. Denies any chest pain or shortness of breath, dizziness or headache at this time. The emergency department, lipase level 864. [facility] called for admission.  Review of Systems:  Constitutional: Denies fever, chills, diaphoresis, appetite change and fatigue.  HEENT: Denies photophobia, eye pain, redness, hearing loss, ear pain, congestion, sore throat, rhinorrhea, sneezing, mouth sores, trouble swallowing, neck pain, neck stiffness and tinnitus.   Respiratory: Denies SOB, DOE, cough, chest tightness,  and wheezing.   Cardiovascular: Denies chest pain, palpitations and leg swelling.  Gastrointestinal: Complains of abdominal pain, nausea and vomiting. Genitourinary: Denies dysuria, urgency, frequency, hematuria, flank pain and difficulty urinating.  Musculoskeletal: Denies myalgias, back pain, joint swelling, arthralgias and gait problem.  Skin: Denies pallor, rash and wound.  Neurological: Denies dizziness, seizures, syncope, weakness, light-headedness, numbness and headaches.  Hematological: Denies adenopathy. Easy bruising, personal or family bleeding history  Psychiatric/Behavioral: Denies suicidal ideation, mood changes, confusion,  nervousness, sleep disturbance and agitation  Past Medical History  Diagnosis Date  . ANEMIA, B12 DEFICIENCY 04/05/2009  . 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 TOBACCO USE 01/25/2008  . 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 HYPERTENSION 01/29/2008  . EXCESSIVE MENSTRUAL BLEEDING 03/18/2010    gyne eval and bx neg  . PERIMENOPAUSAL SYNDROME 08/23/2009  . LEG PAIN, BILATERAL 06/22/2008  . SYNCOPE 01/02/2009  . SWEATING 09/23/2010  . Palpitations 01/02/2009  . THYROID FUNCTION TEST, ABNORMAL 03/19/2009  . Acute pancreatitis 12/05/2010    felt etoh related   . WEIGHT LOSS-ABNORMAL 12/05/2010  . GASTROINTESTINAL XRAY, ABNORMAL 12/16/2010  . RASH 12/23/2010   Past Surgical History  Procedure Laterality Date  . Knee surgery     Social History:  reports that she has been smoking Cigarettes.  She has never used smokeless tobacco. She reports that she does not drink alcohol or use illicit drugs.   Allergies  Allergen Reactions  . Lisinopril     REACTION: ? facial angioedema    Family History  Problem Relation Age of Onset  . Hypertension Mother   . Kidney disease Mother     removed for cyst  . Other Mother     overweight  . Heart attack Father   . Stroke Father     died 38  . Hypertension Brother   . Neuromuscular disorder      spinomuscular atrophy    Prior to Admission medications   Medication Sig Start Date End Date Taking? Authorizing Provider  metoprolol tartrate (LOPRESSOR) 25 MG tablet Take 25 mg by mouth 2 (two) times daily.   Yes Historical Provider, MD   Physical Exam: Filed Vitals:   02/27/16 0935 02/27/16 1347  BP: 181/111 148/71  Pulse: 101 72  Temp:  97.8 F (36.6 C) 97.8 F (36.6 C)  Resp: 16 18     General: Well developed, thin, malnourished, appears older than stated age  HEENT: NCAT, PERRLA, EOMI, Anicteic Sclera, mucous membranes moist. Poor dentition  Neck: Supple, no JVD, no masses  Cardiovascular: S1 S2 auscultated, no  rubs, murmurs or gallops. Regular rate and rhythm.  Respiratory: Clear to auscultation bilaterally with equal chest rise  Abdomen: Soft, upper abdominal tenderness, nondistended, + bowel sounds  Extremities: warm dry without cyanosis clubbing or edema  Neuro: AAOx3, cranial nerves grossly intact. Strength 5/5 in patient's upper and lower extremities bilaterally  Skin: Without rashes exudates or nodules  Psych: Normal affect and demeanor with intact judgement and insight  Labs on Admission:  Basic Metabolic Panel:  Recent Labs Lab 02/27/16 1057  NA 143  K 4.2  CL 107  CO2 27  GLUCOSE 110*  BUN 10  CREATININE 0.44  CALCIUM 8.7*   Liver Function Tests:  Recent Labs Lab 02/27/16 1057  AST 18  ALT 11*  ALKPHOS 54  BILITOT 0.7  PROT 6.7  ALBUMIN 3.4*    Recent Labs Lab 02/27/16 1057  LIPASE 864*   No results for input(s): AMMONIA in the last 168 hours. CBC:  Recent Labs Lab 02/27/16 1013  WBC 7.6  NEUTROABS 6.4  HGB 15.2*  HCT 45.4  MCV 104.8*  PLT 262   Cardiac Enzymes: No results for input(s): CKTOTAL, CKMB, CKMBINDEX, TROPONINI in the last 168 hours.  BNP (last 3 results) No results for input(s): BNP in the last 8760 hours.  ProBNP (last 3 results) No results for input(s): PROBNP in the last 8760 hours.  CBG: No results for input(s): GLUCAP in the last 168 hours.  Radiological Exams on Admission: Dg Abd Acute W/chest  02/27/2016  CLINICAL DATA:  O woke this morning with moderate to severe mid abdominal pain, nausea, vomiting. EXAM: DG ABDOMEN ACUTE W/ 1V CHEST COMPARISON:  Abdomen CT 01/01/2011.  Chest x-ray 02/13/2008 FINDINGS: Hyperinflation of the lungs. No focal opacities or effusions. Heart is normal size. Calcifications noted in the right medial abdomen medial to the right kidney. This could be within the pancreatic head region. Nonobstructive bowel gas pattern. No free air organomegaly. No acute bony abnormality. IMPRESSION: Calcifications  in the medial right abdomen which could be within the region of the pancreatic head. No evidence of bowel obstruction or free air. Hyperinflation of the lungs.  No active cardiopulmonary disease. Electronically Signed   By: Charlett Nose M.D.   On: 02/27/2016 09:57    EKG: None  Assessment/Plan  Abdominal pain secondary to acute pancreatitis -Lipase upon admission 864. -Patient had the acute pancreatitis in 2012  -Admitted for observation, will place on IV fluids, bowel rest, nothing by mouth, antiemetics and pain control as needed -Abdominal x-ray showed no evidence of bowel obstruction or free air -CT abdomen and pelvis pending   Possible GI bleed -Hemoglobin stable, currently 15.2 -Patient endorses vomit with bright red blood in frothiness. Fecal occult was also positive. Patient denies any melena or hematochezia. -Continue to monitor CBC -Gastroenterology consultation appreciated  Essential hypertension -Continue metoprolol.  Will make IV as patient currently NPO  Alcohol abuse -Patient denies alcohol abuse, states she drinks only on the weekends -Does not appear to be going through withdrawal at this time -Continue to monitor closely  Tobacco abuse -Smoking cessation discussed -Offered nicotine patch, patient refused  Malnutrition -Will consult nutrition -Will hold off on feeding supplement as patient will  be NPO  History of macrocytic anemia -Baseline hemoglobin appears to be 10.  -Currently 15.2, ? Hemoconcentration -Continue to monitor CBC  DVT prophylaxis: SCDs  Code Status: Full  Condition: Guarded  Family Communication: None at bedside. Admission, patients condition and plan of care including tests being ordered have been discussed with the patient, who indicates understanding and agrees with the plan and Code Status.  Disposition Plan: Admitted for observation  Time spent: 60 minutes  Faraaz Wolin D.O. [facility] Pager [phone]  If  7PM-7AM, please contact night-coverage [email or web address] Password [email or web address] 02/27/2016, 3:06 PM

## 2016-02-27 NOTE — Progress Notes (Signed)
Pt states she has not seen Dr Berniece AndreasWanda Panosh since 2016 but continues to see Lavinia SharpsMary ann Placey and goes to health department for the MAP program CM answered questions about [facility] program Pt states she will continue to keep Panosh as an alternative pcp Reports this dr has all her records   CM discussed and provided written information for uninsured accepting pcps, discussed the importance of pcp vs EDP services for f/u care, www.needymeds.org, www.goodrx.com, discounted pharmacies and other [place] resources such as [facility] , [facility], affordable care act, financial assistance, uninsured dental services, [place] [facility] assist, DSS and  health department  Reviewed resources for [place] uninsured accepting pcps like [facility], family medicine at [address], [facility], [facility], local urgent care centers, [facility], [facility], general medical clinics, family services of the [place], [facility] urgent [facility] plus others, medication resources, [facility] out patient pharmacies and housing Pt voiced understanding and appreciation of resources provided   Provided [facility] contact information Pt states she has an orange card for the [facility]   Pt states she has recently been [facility] at the [facility] [phone])  and wanted to get the contact number to call if she is to be admit-- lipase elevated

## 2016-02-28 ENCOUNTER — Observation Stay (HOSPITAL_COMMUNITY): Payer: Self-pay | Admitting: Certified Registered Nurse Anesthetist

## 2016-02-28 ENCOUNTER — Encounter (HOSPITAL_COMMUNITY)
Admission: EM | Disposition: A | Discharge status: Home / Self Care | Payer: Self-pay | Source: Home / Self Care | Attending: Internal Medicine

## 2016-02-28 ENCOUNTER — Encounter (HOSPITAL_COMMUNITY): Payer: Self-pay | Admitting: Physician Assistant

## 2016-02-28 DIAGNOSIS — I10 Essential (primary) hypertension: Secondary | ICD-10-CM

## 2016-02-28 DIAGNOSIS — K222 Esophageal obstruction: Secondary | ICD-10-CM

## 2016-02-28 DIAGNOSIS — F17200 Nicotine dependence, unspecified, uncomplicated: Secondary | ICD-10-CM

## 2016-02-28 DIAGNOSIS — K920 Hematemesis: Secondary | ICD-10-CM | POA: Insufficient documentation

## 2016-02-28 HISTORY — PX: ESOPHAGOGASTRODUODENOSCOPY (EGD) WITH PROPOFOL: SHX5813

## 2016-02-28 LAB — CBC
HCT: 37.9 % (ref 36.0–46.0)
Hemoglobin: 12.4 g/dL (ref 12.0–15.0)
MCH: 35.1 pg — AB (ref 26.0–34.0)
MCHC: 32.7 g/dL (ref 30.0–36.0)
MCV: 107.4 fL — ABNORMAL HIGH (ref 78.0–100.0)
PLATELETS: 210 10*3/uL (ref 150–400)
RBC: 3.53 MIL/uL — AB (ref 3.87–5.11)
RDW: 13.8 % (ref 11.5–15.5)
WBC: 3.5 10*3/uL — ABNORMAL LOW (ref 4.0–10.5)

## 2016-02-28 LAB — COMPREHENSIVE METABOLIC PANEL
ALK PHOS: 52 U/L (ref 38–126)
ALT: 10 U/L — AB (ref 14–54)
AST: 19 U/L (ref 15–41)
Albumin: 3 g/dL — ABNORMAL LOW (ref 3.5–5.0)
Anion gap: 12 (ref 5–15)
BUN: 6 mg/dL (ref 6–20)
CALCIUM: 9 mg/dL (ref 8.9–10.3)
CO2: 23 mmol/L (ref 22–32)
CREATININE: 0.55 mg/dL (ref 0.44–1.00)
Chloride: 108 mmol/L (ref 101–111)
GFR calc non Af Amer: >60 mL/min (ref >60)
Glucose, Bld: 55 mg/dL — ABNORMAL LOW (ref 65–99)
Potassium: 3.5 mmol/L (ref 3.5–5.1)
SODIUM: 143 mmol/L (ref 135–145)
Total Bilirubin: 1 mg/dL (ref 0.3–1.2)
Total Protein: 6.5 g/dL (ref 6.5–8.1)

## 2016-02-28 LAB — HEMOGLOBIN AND HEMATOCRIT, BLOOD
HCT: 36.4 % (ref 36.0–46.0)
Hemoglobin: 12.1 g/dL (ref 12.0–15.0)

## 2016-02-28 LAB — GLUCOSE, CAPILLARY: Glucose-Capillary: 90 mg/dL (ref 65–99)

## 2016-02-28 LAB — LIPID PANEL
CHOL/HDL RATIO: 4.5 ratio
Cholesterol: 143 mg/dL (ref 0–200)
HDL: 32 mg/dL — ABNORMAL LOW (ref >40)
LDL CALC: 83 mg/dL (ref 0–99)
Triglycerides: 141 mg/dL (ref <150)
VLDL: 28 mg/dL (ref 0–40)

## 2016-02-28 LAB — LIPASE, BLOOD: LIPASE: 1198 U/L — AB (ref 11–51)

## 2016-02-28 SURGERY — ESOPHAGOGASTRODUODENOSCOPY (EGD) WITH PROPOFOL
Anesthesia: Monitor Anesthesia Care

## 2016-02-28 MED ORDER — METOPROLOL TARTRATE 25 MG PO TABS
25.0000 mg | ORAL_TABLET | Freq: Two times a day (BID) | ORAL | Status: DC
  Filled 2016-02-28 (×5): qty 1

## 2016-02-28 MED ORDER — LORAZEPAM 2 MG/ML IJ SOLN: 1.0000 mg | Freq: Four times a day (QID) | INTRAMUSCULAR | Status: DC | PRN

## 2016-02-28 MED ORDER — PANTOPRAZOLE SODIUM 40 MG PO TBEC
40.0000 mg | DELAYED_RELEASE_TABLET | Freq: Two times a day (BID) | ORAL | Status: DC
  Administered 2016-02-29: 40 mg via ORAL
  Filled 2016-02-28 (×5): qty 1

## 2016-02-28 MED ORDER — LIDOCAINE HCL (CARDIAC) 20 MG/ML IV SOLN
INTRAVENOUS | Status: DC | PRN
  Administered 2016-02-28: 60 mg via INTRAVENOUS

## 2016-02-28 MED ORDER — VITAMIN B-1 100 MG PO TABS
100.0000 mg | ORAL_TABLET | Freq: Every day | ORAL | Status: DC
  Filled 2016-02-28 (×3): qty 1

## 2016-02-28 MED ORDER — ONDANSETRON HCL 4 MG/2ML IJ SOLN
INTRAMUSCULAR | Status: DC | PRN
  Administered 2016-02-28: 4 mg via INTRAVENOUS

## 2016-02-28 MED ORDER — PROPOFOL 500 MG/50ML IV EMUL
INTRAVENOUS | Status: DC | PRN
  Administered 2016-02-28: 75 ug/kg/min via INTRAVENOUS

## 2016-02-28 MED ORDER — LIDOCAINE HCL (CARDIAC) 20 MG/ML IV SOLN
INTRAVENOUS | Status: AC
  Filled 2016-02-28: qty 5

## 2016-02-28 MED ORDER — LORAZEPAM 1 MG PO TABS: 1.0000 mg | ORAL_TABLET | Freq: Four times a day (QID) | ORAL | Status: DC | PRN

## 2016-02-28 MED ORDER — PROPOFOL 10 MG/ML IV BOLUS
INTRAVENOUS | Status: DC | PRN
  Administered 2016-02-28: 30 mg via INTRAVENOUS
  Administered 2016-02-28 (×3): 20 mg via INTRAVENOUS
  Administered 2016-02-28: 50 mg via INTRAVENOUS

## 2016-02-28 MED ORDER — ONDANSETRON HCL 4 MG/2ML IJ SOLN
INTRAMUSCULAR | Status: AC
  Filled 2016-02-28: qty 2

## 2016-02-28 MED ORDER — SODIUM CHLORIDE 0.9 % IV SOLN: INTRAVENOUS | Status: AC

## 2016-02-28 MED ORDER — FOLIC ACID 1 MG PO TABS
1.0000 mg | ORAL_TABLET | Freq: Every day | ORAL | Status: DC
  Filled 2016-02-28 (×3): qty 1

## 2016-02-28 MED ORDER — THIAMINE HCL 100 MG/ML IJ SOLN
100.0000 mg | Freq: Every day | INTRAMUSCULAR | Status: DC
  Filled 2016-02-28 (×2): qty 1

## 2016-02-28 MED ORDER — PROPOFOL 10 MG/ML IV BOLUS
INTRAVENOUS | Status: AC
  Filled 2016-02-28: qty 60

## 2016-02-28 MED ORDER — ADULT MULTIVITAMIN W/MINERALS CH
1.0000 | ORAL_TABLET | Freq: Every day | ORAL | Status: DC
  Filled 2016-02-28 (×3): qty 1

## 2016-02-28 SURGICAL SUPPLY — 15 items

## 2016-02-28 NOTE — Interval H&P Note (Signed)
History and Physical Interval Note:  02/28/2016 11:28 AM  Nichole [name]  has presented today for surgery, with the diagnosis of Hematemesis, and abnormal CT scan  The various methods of treatment have been discussed with the patient and family. After consideration of risks, benefits and other options for treatment, the patient has consented to  Procedure(s): ESOPHAGOGASTRODUODENOSCOPY (EGD) WITH PROPOFOL (N/A) as a surgical intervention .  The patient's history has been reviewed, patient examined, no change in status, stable for surgery.  I have reviewed the patient's chart and labs.  Questions were answered to the patient's satisfaction.     [name]

## 2016-02-28 NOTE — Anesthesia Postprocedure Evaluation (Signed)
Anesthesia Post Note  Patient: Nichole [name]  Procedure(s) Performed: Procedure(s) (LRB): ESOPHAGOGASTRODUODENOSCOPY (EGD) WITH PROPOFOL (N/A)  Patient location during evaluation: PACU Anesthesia Type: MAC Level of consciousness: awake and alert Pain management: pain level controlled Vital Signs Assessment: post-procedure vital signs reviewed and stable Respiratory status: spontaneous breathing, nonlabored ventilation, respiratory function stable and patient connected to nasal cannula oxygen Cardiovascular status: blood pressure returned to baseline and stable Postop Assessment: no signs of nausea or vomiting Anesthetic complications: no    Last Vitals:  Filed Vitals:   02/28/16 1232 02/28/16 1431  BP: 137/120 152/76  Pulse: 93 68  Temp: 37 C 36.4 C  Resp: 20 20    Last Pain:  Filed Vitals:   02/28/16 1432  PainSc: 0-No pain                 Andrian Urbach L

## 2016-02-28 NOTE — H&P (View-Only) (Signed)
 Referring Provider: TRH, Dr. Mikhail Primary Care Physician:  PLACEY,MARY H, NP Primary Gastroenterologist:  Dr. Kaplan  Reason for Consultation:  Pancreatitis, hematemesis/FOBT positive  HPI: Nichole [name] is a 55 y.o. female with a history of hypertension, pancreatitis/pseudocysts in 2012.  She presented to the emergency department at [facility] this AM with complaints of abdominal pain. Patient states she's had abdominal pain over the last 2 months. She woke up this morning to worsening abdominal pain, nausea and vomiting. She stated her vomit was bright red/contained blood.  This occurred on 2 occasions. Patient stated her stool was dark brown in color when she had a BM yesterday, no black stools.  Patient does endorse alcohol use, only on the weekends, but has more extensive use in the past.  In the emergency department, lipase level 864, Hgb 15.2 grams.  MCV is high at 104.8 and has been extremely high in the past.  She was FOBT positive.  Current says that the pain is better after getting some pain medicine.  Saying that she would like to eat.  CT scan of the abdomen and pelvis with contrast showed the following:  "IMPRESSION: 1. Acute on chronic pancreatitis. 2. Minimal enlargement of a a complex "Mass" in the portacaval space since 2012. 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 There are no specific features to suggest pseudoaneurysm or aneurysm. However, if aspiration is considered, ultrasound characterization suggested to exclude unlikely vascular etiology. 3. Small volume cul-de-sac fluid is minimally complex and may be secondary to pancreatitis. 4. Significantly age advanced atherosclerosis. 5. Suspicion of the uterine fundal fibroid. 6. No calcified gallstones. The common duct is not well evaluated secondary to mass effect from the dominant mass. No intrahepatic ductal dilatation to strongly suggest biliary obstruction. 7. Gastric underdistention with suspicion of concurrent  gastric wall thickening. Correlate with symptoms of gastritis."  Never had any endoscopic evaluation in the past.     Past Medical History  Diagnosis Date  . ANEMIA, B12 DEFICIENCY 04/05/2009  . 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 TOBACCO USE 01/25/2008  . 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 HYPERTENSION 01/29/2008  . EXCESSIVE MENSTRUAL BLEEDING 03/18/2010    gyne eval and bx neg  . PERIMENOPAUSAL SYNDROME 08/23/2009  . LEG PAIN, BILATERAL 06/22/2008  . SYNCOPE 01/02/2009  . SWEATING 09/23/2010  . Palpitations 01/02/2009  . THYROID FUNCTION TEST, ABNORMAL 03/19/2009  . Acute pancreatitis 12/05/2010    felt etoh related   . WEIGHT LOSS-ABNORMAL 12/05/2010  . GASTROINTESTINAL XRAY, ABNORMAL 12/16/2010  . RASH 12/23/2010    Past Surgical History  Procedure Laterality Date  . Knee surgery      Prior to Admission medications   Medication Sig Start Date End Date Taking? Authorizing Provider  metoprolol tartrate (LOPRESSOR) 25 MG tablet Take 25 mg by mouth 2 (two) times daily.   Yes Historical Provider, MD    Current Facility-Administered Medications  Medication Dose Route Frequency Provider Last Rate Last Dose  . 0.9 %  sodium chloride infusion   Intravenous Continuous Jon Knapp, MD   Stopped at 02/27/16 1250  . HYDROmorphone (DILAUDID) injection 0.5 mg  0.5 mg Intravenous Q30 min PRN Jon Knapp, MD   0.5 mg at 02/27/16 1020   Current Outpatient Prescriptions  Medication Sig Dispense Refill  . metoprolol tartrate (LOPRESSOR) 25 MG tablet Take 25 mg by mouth 2 (two) times daily.      Allergies as of 02/27/2016 -   Review Complete 02/27/2016  Allergen Reaction Noted  . Lisinopril  08/06/2010    Family History  Problem Relation Age of Onset  . Hypertension Mother   . Kidney disease Mother     removed for cyst  . Other Mother     overweight  . Heart attack Father   . Stroke Father     died 70  . Hypertension Brother   . Neuromuscular disorder       spinomuscular atrophy    Social History   Social History  . Marital Status: Single    Spouse Name: N/A  . Number of Children: N/A  . Years of Education: N/A   Occupational History  . Not on file.   Social History Main Topics  . Smoking status: Current Every Day Smoker    Types: Cigarettes  . Smokeless tobacco: Never Used     Comment: less than 1/2 pack  . Alcohol Use: No  . Drug Use: No  . Sexual Activity: Not on file   Other Topics Concern  . 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  Physical Exam: Vital signs in last 24 hours: Temp:  [97.8 F (36.6 C)] 97.8 F (36.6 C) (03/29 1347) Pulse Rate:  [72-101] 72 (03/29 1347) Resp:  [16-18] 18 (03/29 1347) BP: (148-181)/(71-111) 148/71 mmHg (03/29 1347) SpO2:  [99 %-100 %] 99 % (03/29 1347) Weight:  [108 lb (48.988 kg)] 108 lb (48.988 kg) (03/29 0935)   General:  Alert, thin, pleasant and cooperative in NAD Head:  Normocephalic and atraumatic. Eyes:  Sclera clear, no icterus.  Conjunctiva pink. Ears:  Normal auditory acuity. Mouth:  No deformity or lesions.   Neck:  Supple; no masses or thyromegaly. Lungs:  Clear throughout to auscultation.   No wheezes, crackles, or rhonchi. Heart:  Regular rate and rhythm; no murmurs, clicks, rubs, or gallops. Abdomen:  Soft, non-distended.  BS present.  Epigastric TTP. Rectal:  Deferred.  Was hemoccult positive in the ED.  Msk:  Symmetrical without gross deformities.  Pulses:  Normal pulses noted. Extremities:  Without clubbing or edema. Neurologic:  Alert and  oriented x4;  grossly normal neurologically. Skin:  Intact without  significant lesions or rashes. Psych:  Alert and cooperative. Normal mood and affect.  Lab Results:  Recent Labs  02/27/16 1013  WBC 7.6  HGB 15.2*  HCT 45.4  PLT 262   BMET  Recent Labs  02/27/16 1057  NA 143  K 4.2  CL 107  CO2 27  GLUCOSE 110*  BUN 10  CREATININE 0.44  CALCIUM 8.7*   LFT  Recent Labs  02/27/16 1057  PROT 6.7  ALBUMIN 3.4*  AST 18  ALT 11*  ALKPHOS 54  BILITOT 0.7   PT/INR  Recent Labs  02/27/16 1057  LABPROT 14.1  INR 1.07   Studies/Results: Ct Abdomen Pelvis W Contrast  02/27/2016  CLINICAL DATA:  Epigastric pain since this morning.   Nausea and vomiting. Question pancreatitis. History prior pancreatitis. EXAM: CT ABDOMEN AND PELVIS WITH CONTRAST TECHNIQUE: Multidetector CT imaging of the abdomen and pelvis was performed using the standard protocol following bolus administration of intravenous contrast. CONTRAST:  100mL ISOVUE-300 IOPAMIDOL (ISOVUE-300) INJECTION 61% COMPARISON:  Plain film of earlier today and CT of 01/01/2011 FINDINGS: Lower chest: Clear lung bases. Normal heart size without pericardial or pleural effusion. Hepatobiliary: Normal liver. Normal gallbladder. No intrahepatic biliary duct dilatation. The pancreatic duct is not well visualized secondary mass effect by the dominant porta hepatis lesion. No intrahepatic duct dilatation identified. Pancreas: Mild pancreatic atrophy. Pancreatic parenchymal calcifications, consistent with chronic calcific pancreatitis. Moderate peripancreatic edema throughout. Pancreas enhances normally. The pancreatic head is displaced anteriorly by a complex lesion within the portocaval space which measures 4.5 x 6.1 cm on image 18/series 2. This is at the site of a 1.9 x 2.0 cm lesion on 01/01/2011. Although there is hyperattenuation about the medial portion of this lesion, there is no enhancement to strongly suggest pseudoaneurysm. Spleen: Normal in size, without focal abnormality. Adrenals/Urinary  Tract: Normal adrenal glands. Normal kidneys, without hydronephrosis. Normal urinary bladder. Stomach/Bowel: Proximal gastric underdistention. Probable concurrent gastric wall thickening, including a 2.7 cm greater curvature on image 8/series 2. The gastric antrum is displaced anteriorly, but not obstructed. Normal colon and terminal ileum. Normal small bowel. Vascular/Lymphatic: Advanced aortic and branch vessel atherosclerosis. No splenic artery pseudoaneurysm. No abdominopelvic adenopathy. Reproductive: Retroverted uterus. Suspect a fundal fibroid, including image 54/series 2. No adnexal mass. Other: Small volume cul-de-sac fluid is minimally complex and may be secondary to pancreatitis. No significant abdominal ascites. Musculoskeletal: No acute osseous abnormality. IMPRESSION: 1. Acute on chronic pancreatitis. 2. Minimal enlargement of a a complex "Mass" in the portacaval space since 2012. 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 There are no specific features to suggest pseudoaneurysm or aneurysm. However, if aspiration is considered, ultrasound characterization suggested to exclude unlikely vascular etiology. 3. Small volume cul-de-sac fluid is minimally complex and may be secondary to pancreatitis. 4. Significantly age advanced atherosclerosis. 5. Suspicion of the uterine fundal fibroid. 6. No calcified gallstones. The common duct is not well evaluated secondary to mass effect from the dominant mass. No intrahepatic ductal dilatation to strongly suggest biliary obstruction. 7. Gastric underdistention with suspicion of concurrent gastric wall thickening. Correlate with symptoms of gastritis. Electronically Signed   By: Kyle  Talbot M.D.   On: 02/27/2016 15:11   Dg Abd Acute W/chest  02/27/2016  CLINICAL DATA:  O woke this morning with moderate to severe mid abdominal pain, nausea, vomiting. EXAM: DG ABDOMEN ACUTE W/ 1V CHEST COMPARISON:  Abdomen CT 01/01/2011.  Chest x-ray 02/13/2008 FINDINGS: Hyperinflation of  the lungs. No focal opacities or effusions. Heart is normal size. Calcifications noted in the right medial abdomen medial to the right kidney. This could be within the pancreatic head region. Nonobstructive bowel gas pattern. No free air organomegaly. No acute bony abnormality. IMPRESSION: Calcifications in the medial right abdomen which could be within the region of the pancreatic head. No evidence of bowel obstruction or free air. Hyperinflation of the lungs.  No active cardiopulmonary disease. Electronically Signed   By: Kevin  Dover M.D.   On: 02/27/2016 09:57   IMPRESSION:  -Acute on chronic pancreatitis with what appears to be a complex pseudocyst:  Secondary to ETOH.  Lipase 864. -Hematemesis with hemoccult positive stools:  Hgb is normal.  CT scan also suggests some gastric wall thickening. -Macrocytosis:  MCV 104.8.  Folate was low in   the past.  PLAN: -Standard supportive care for pancreatitis with IVF's, bowel rest (ok for clear liquids if tolerated), anti-emetics, pain control). -Monitor labs. -Will plan for EGD 3/30.  ZEHR, JESSICA D.  02/27/2016, 3:21 PM  Pager number [phone]     Attending physician's note   I have taken a history, examined the patient and reviewed the chart. I agree with the Advanced Practitioner's note, impression and recommendations. Acute on chronic alcoholic pancreatitis presented with hematemesis. Hgb 15, likely hemoconcentration. IV fluids and supportive care. Will plan for EGD tomorrow AM. PPI BID. NPO after midnight. Ok to start clears if able to tolerate.   K Veena Shyne Lehrke, MD [phone] Mon-Fri 8a-5p [phone] after 5p, weekends, holidays  

## 2016-02-28 NOTE — Anesthesia Preprocedure Evaluation (Addendum)
Anesthesia Evaluation  Patient identified by MRN, date of birth, ID band Patient awake    Reviewed: Allergy & Precautions, H&P , NPO status , Patient's Chart, lab work & pertinent test results, reviewed documented beta blocker date and time   Airway Mallampati: II  TM Distance: >3 FB Neck ROM: full    Dental  (+) Dental Advisory Given, Missing Left upper front missing:   Pulmonary Current Smoker,    Pulmonary exam normal breath sounds clear to auscultation       Cardiovascular hypertension, Pt. on home beta blockers Normal cardiovascular exam Rhythm:regular Rate:Normal  Syncope. Neuropathy. palpitations   Neuro/Psych negative neurological ROS  negative psych ROS   GI/Hepatic negative GI ROS, (+) Cirrhosis     substance abuse  alcohol use, Acute pancreatitis   Endo/Other  negative endocrine ROS  Renal/GU negative Renal ROS  negative genitourinary   Musculoskeletal   Abdominal   Peds  Hematology negative hematology ROS (+)   Anesthesia Other Findings   Reproductive/Obstetrics negative OB ROS                          Anesthesia Physical Anesthesia Plan  ASA: III  Anesthesia Plan: MAC   Post-op Pain Management:    Induction:   Airway Management Planned:   Additional Equipment:   Intra-op Plan:   Post-operative Plan:   Informed Consent: I have reviewed the patients History and Physical, chart, labs and discussed the procedure including the risks, benefits and alternatives for the proposed anesthesia with the patient or authorized representative who has indicated his/her understanding and acceptance.   Dental Advisory Given  Plan Discussed with: CRNA and Surgeon  Anesthesia Plan Comments:        Anesthesia Quick Evaluation

## 2016-02-28 NOTE — Op Note (Addendum)
[facility] Patient Name: Nichole HilaCharisse Calabretta Procedure Date: 02/28/2016 MRN: [id number] Attending MD: Napoleon FormKavitha V. Fatiha Guzy , MD Date of Birth: Sep 19, 1961 CSN:  Age: 55 Admit Type: Inpatient Procedure:                Upper GI endoscopy Indications:              Hematemesis Providers:                Napoleon FormKavitha V. Kimberla Driskill, MD, Dwain SarnaPatricia Ford, RN, Darletta MollJackie                            Aiken, Technician Referring MD:              Medicines:                Monitored Anesthesia Care Complications:            No immediate complications. Estimated Blood Loss:     Estimated blood loss was minimal. Procedure:                Pre-Anesthesia Assessment:                           - Prior to the procedure, a History and Physical                            was performed, and patient medications and                            allergies were reviewed. The patient's tolerance of                            previous anesthesia was also reviewed. The risks                            and benefits of the procedure and the sedation                            options and risks were discussed with the patient.                            All questions were answered, and informed consent                            was obtained. Prior Anticoagulants: The patient has                            taken no previous anticoagulant or antiplatelet                            agents. ASA Grade Assessment: III - A patient with                            severe systemic disease. After reviewing the risks  and benefits, the patient was deemed in                            satisfactory condition to undergo the procedure.                           After obtaining informed consent, the endoscope was                            passed under direct vision. Throughout the                            procedure, the patient's blood pressure, pulse, and                            oxygen saturations were  monitored continuously. The                            [id number] ([id number]) scope was introduced through the                            mouth, and advanced to the second part of duodenum.                            The upper GI endoscopy was accomplished without                            difficulty. The patient tolerated the procedure                            well. Scope In: Scope Out: Findings:      One moderate (circumferential scarring or stenosis; an endoscope may       pass) benign-appearing, intrinsic stenosis was found 34 to 35 cm from       the incisors. And was traversed without difficulty. A TTS dilator was       passed through the scope. Dilation with a 15-16.5-18 mm balloon dilator       was performed to 18 mm. The dilation site was examined following       endoscope reinsertion and showed minimal bleeding, no mucosal tear or       perforation. Estimated blood loss was minimal.      The entire examined stomach was normal.      The examined duodenum was normal. Impression:               - Benign-appearing esophageal stenosis. Dilated.                           - Normal stomach.                           - Normal examined duodenum.                           - No specimens collected. Moderate Sedation:      N/A- Per Anesthesia Care Recommendation:           -  Patient has a contact number available for                            emergencies. The signs and symptoms of potential                            delayed complications were discussed with the                            patient. Return to normal activities tomorrow.                            Written discharge instructions were provided to the                            patient.                           - Advance diet as tolerated today.                           - Continue present medications. (PPI twice daily)                           - Repeat upper endoscopy PRN.                           - Return to GI clinic  PRN. Procedure Code(s):        --- Professional ---                           [phone], Esophagogastroduodenoscopy, flexible,                            transoral; with transendoscopic balloon dilation of                            esophagus (less than 30 mm diameter) Diagnosis Code(s):        --- Professional ---                           K22.2, Esophageal obstruction                           K92.0, Hematemesis CPT copyright 2016 [employer]. All rights reserved. The codes documented in this report are preliminary and upon coder review may  be revised to meet current compliance requirements. Napoleon Form, MD 02/28/2016 12:31:47 PM This report has been signed electronically. Number of Addenda: 0

## 2016-02-28 NOTE — Progress Notes (Signed)
PROGRESS NOTE    Nichole [name]  WUJ:[id number]  DOB: Feb 28, 1961  DOA: 02/27/2016 PCP: Jacklynn Barnacle, NP Outpatient Specialists:   Hospital course: 55 year old female with history of HTN, presumed alcoholic pancreatitis in 2012, alcohol abuse, anemia, tobacco abuse, HTN, presented to the ED with complaints of acute on chronic abdominal pain which had been going on for last 2 months with associated nausea and vomiting with some blood and dark stools. In the ED, lipase 864. Admitted for acute pancreatitis and possible UGIB. [facility] consulted and she underwent EGD 3/30.   Assessment & Plan:   Acute on chronic pancreatitis, likely alcoholic - Presented with lipase of 864, increased to 1198 the next day. LFTs unremarkable making biliary etiology less likely. Will check fasting lipids. - CT abdomen and pelvis shows acute on chronic pancreatitis and minimal enlargement of a complex "mass" in the portacaval space since 2012. - Alcohol abstinence counseled and she verbalized understanding. - Treating supportively with IV fluids, bowel rest, pain medications and advance diet as tolerated.  Acute on chronic abdominal pain - Likely related to pancreatitis. EGD without findings to explain her abdominal pain.  ? Acute upper GI bleed - Hemoglobin stable. FOBT +. -  GI was consulted. - Continue PPI.  Abnormal CT abdomen/complex "mass" in the portacaval space - As per CT report, minimal enlargement of a complex "mass" in the portacaval space since 2012. Most likely a complex cyst. There are no specific features to suggest pseudoaneurysm or aneurysm.? Need for EUS. - Outpatient follow-up as deemed necessary.  Macrocytosis  - Likely related to alcohol abuse.   Essential hypertension - Controlled.  Alcohol dependence - Patient states that she drinks couple of glasses of wine some days a week and 4-5 mixed drinks on weekends (Friday, Saturday & Sunday). Appears to understate her  drinking. - Abstinence counseled. - CIWA protocol  Tobacco abuse - Cessation counseled. Patient declined nicotine patch.     DVT prophylaxis: SCD's Code Status: Full Family Communication: None at bedside Disposition Plan: DC home when medically stable.   Consultants:  [facility]  Procedures:  EGD 02/28/16: Impression: - Benign-appearing esophageal stenosis. Dilated.  - Normal stomach.  - Normal examined duodenum.  - No specimens collected.  Recommendation: - Patient has a contact number available for   emergencies. The signs and symptoms of potential   delayed complications were discussed with the   patient. Return to normal activities tomorrow.   Written discharge instructions were provided to the   patient.  - Advance diet as tolerated today.  - Continue present medications. (PPI twice daily)  - Repeat upper endoscopy PRN.  - Return to GI clinic PRN.  Antimicrobials:  None  Subjective: Patient was seen this morning prior to EGD. Wanted something to eat while she was NPO for procedure. States that the abdominal pain had resolved. No further emesis or BM since admission.  Objective: Filed Vitals:   02/27/16 2208 02/28/16 0000 02/28/16 0541 02/28/16 0632  BP: 161/84 129/76 118/59   Pulse: 61  80   Temp: 97.9 F (36.6 C)  98.1 F (36.7 C)   TempSrc: Oral  Oral   Resp: 16  14   Height:      Weight:    46.4 kg (102 lb 4.7 oz)  SpO2: 100%  99%     Intake/Output Summary (Last 24 hours) at 02/28/16 0737 Last data filed at 02/28/16 0640  Gross per 24 hour  Intake 2273.75 ml  Output  0 ml  Net 2273.75 ml   Filed Weights   02/27/16  0935 02/28/16 [id number]  Weight: 48.988 kg (108 lb) 46.4 kg (102 lb 4.7 oz)    Exam:  General exam: Pleasant middle-aged female seen ambulating comfortably in the room. Respiratory system: Clear. No increased work of breathing. Cardiovascular system: S1 & S2 heard, RRR. No JVD, murmurs, gallops, clicks or pedal edema. Gastrointestinal system: Abdomen is nondistended, soft and nontender. Normal bowel sounds heard. Central nervous system: Alert and oriented. No focal neurological deficits. Extremities: Symmetric 5 x 5 power.   Data Reviewed: Basic Metabolic Panel:  Recent Labs Lab 02/27/16 1057 02/28/16 0322  NA 143 143  K 4.2 3.5  CL 107 108  CO2 27 23  GLUCOSE 110* 55*  BUN 10 6  CREATININE 0.44 0.55  CALCIUM 8.7* 9.0   Liver Function Tests:  Recent Labs Lab 02/27/16 1057 02/28/16 0322  AST 18 19  ALT 11* 10*  ALKPHOS 54 52  BILITOT 0.7 1.0  PROT 6.7 6.5  ALBUMIN 3.4* 3.0*    Recent Labs Lab 02/27/16 1057 02/28/16 0322  LIPASE 864* 1198*   No results for input(s): AMMONIA in the last 168 hours. CBC:  Recent Labs Lab 02/27/16 1013 02/27/16 1916 02/28/16 0322 02/28/16 0550  WBC 7.6  --  3.5*  --   NEUTROABS 6.4  --   --   --   HGB 15.2* 12.6 12.4 12.1  HCT 45.4 37.9 37.9 36.4  MCV 104.8*  --  107.4*  --   PLT 262  --  210  --    Cardiac Enzymes: No results for input(s): CKTOTAL, CKMB, CKMBINDEX, TROPONINI in the last 168 hours. BNP (last 3 results) No results for input(s): PROBNP in the last 8760 hours. CBG: No results for input(s): GLUCAP in the last 168 hours.  No results found for this or any previous visit (from the past 240 hour(s)).       Studies: Ct Abdomen Pelvis W Contrast  02/27/2016  CLINICAL DATA:  Epigastric pain since this morning. Nausea and vomiting. Question pancreatitis. History prior pancreatitis. EXAM: CT ABDOMEN AND PELVIS WITH CONTRAST TECHNIQUE: Multidetector CT imaging of the abdomen and pelvis was performed using the  standard protocol following bolus administration of intravenous contrast. CONTRAST:  100mL ISOVUE-300 IOPAMIDOL (ISOVUE-300) INJECTION 61% COMPARISON:  Plain film of earlier today and CT of 01/01/2011 FINDINGS: Lower chest: Clear lung bases. Normal heart size without pericardial or pleural effusion. Hepatobiliary: Normal liver. Normal gallbladder. No intrahepatic biliary duct dilatation. The pancreatic duct is not well visualized secondary mass effect by the dominant porta hepatis lesion. No intrahepatic duct dilatation identified. Pancreas: Mild pancreatic atrophy. Pancreatic parenchymal calcifications, consistent with chronic calcific pancreatitis. Moderate peripancreatic edema throughout. Pancreas enhances normally. The pancreatic head is displaced anteriorly by a complex lesion within the portocaval space which measures 4.5 x 6.1 cm on image 18/series 2. This is at the site of a 1.9 x 2.0 cm lesion on 01/01/2011. Although there is hyperattenuation about the medial portion of this lesion, there is no enhancement to strongly suggest pseudoaneurysm. Spleen: Normal in size, without focal abnormality. Adrenals/Urinary Tract: Normal adrenal glands. Normal kidneys, without hydronephrosis. Normal urinary bladder. Stomach/Bowel: Proximal gastric underdistention. Probable concurrent gastric wall thickening, including a 2.7 cm greater curvature on image 8/series 2. The gastric antrum is displaced anteriorly, but not obstructed. Normal colon and terminal ileum. Normal small bowel. Vascular/Lymphatic: Advanced aortic and branch vessel atherosclerosis. No splenic artery pseudoaneurysm. No abdominopelvic adenopathy. Reproductive:  Retroverted uterus. Suspect a fundal fibroid, including image 54/series 2. No adnexal mass. Other: Small volume cul-de-sac fluid is minimally complex and may be secondary to pancreatitis. No significant abdominal ascites. Musculoskeletal: No acute osseous abnormality. IMPRESSION: 1. Acute on chronic  pancreatitis. 2. Minimal enlargement of a a complex "Mass" in the portacaval space since 2012. 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 There are no specific features to suggest pseudoaneurysm or aneurysm. However, if aspiration is considered, ultrasound characterization suggested to exclude unlikely vascular etiology. 3. Small volume cul-de-sac fluid is minimally complex and may be secondary to pancreatitis. 4. Significantly age advanced atherosclerosis. 5. Suspicion of the uterine fundal fibroid. 6. No calcified gallstones. The common duct is not well evaluated secondary to mass effect from the dominant mass. No intrahepatic ductal dilatation to strongly suggest biliary obstruction. 7. Gastric underdistention with suspicion of concurrent gastric wall thickening. Correlate with symptoms of gastritis. Electronically Signed   By: Jeronimo Greaves M.D.   On: 02/27/2016 15:11   Dg Abd Acute W/chest  02/27/2016  CLINICAL DATA:  O woke this morning with moderate to severe mid abdominal pain, nausea, vomiting. EXAM: DG ABDOMEN ACUTE W/ 1V CHEST COMPARISON:  Abdomen CT 01/01/2011.  Chest x-ray 02/13/2008 FINDINGS: Hyperinflation of the lungs. No focal opacities or effusions. Heart is normal size. Calcifications noted in the right medial abdomen medial to the right kidney. This could be within the pancreatic head region. Nonobstructive bowel gas pattern. No free air organomegaly. No acute bony abnormality. IMPRESSION: Calcifications in the medial right abdomen which could be within the region of the pancreatic head. No evidence of bowel obstruction or free air. Hyperinflation of the lungs.  No active cardiopulmonary disease. Electronically Signed   By: Charlett Nose M.D.   On: 02/27/2016 09:57        Scheduled Meds: . metoprolol  2.5 mg Intravenous 4 times per day   Continuous Infusions: . sodium chloride 125 mL/hr at 02/28/16 0100  . sodium chloride      Active Problems:   TOBACCO USE   Essential hypertension    Acute pancreatitis   Alcohol abuse    Time spent: 30 minutes.    Marcellus Scott, MD, FACP, FHM. [facility] Pager [phone]  If 7PM-7AM, please contact night-coverage [email or web address] Password [facility] 02/28/2016, 7:37 AM

## 2016-02-28 NOTE — Progress Notes (Signed)
Initial Nutrition Assessment  DOCUMENTATION CODES:   Non-severe (moderate) malnutrition in context of acute illness/injury  INTERVENTION:   -Upon diet advancement, provide Boost Plus po BID, each supplement provides 360 kcals and 14 grams of protein. - Will continue to monitor for nutrition needs.   NUTRITION DIAGNOSIS:   Malnutrition related to acute illness as evidenced by mild depletion of body fat, mild depletion of muscle mass, percent weight loss.  GOAL:   Patient will meet greater than or equal to 90% of their needs  MONITOR:   Diet advancement, Labs, Weight trends, Skin  REASON FOR ASSESSMENT:   Malnutrition Screening Tool    ASSESSMENT:   55 y.o. female with a history of HTN, pancreatitis in 2012, that presented to the emergency department with complaints of abdominal pain over the last 2 months. She will follow-up this morning to worsening abdominal pain, nausea and vomiting. She stated her vomit was bright red with pink frothiness. Patient stated her stool was dark brown in color. Denies hemorrhoids or straining. Patient does endorse alcohol use, only on the weekends.    Patient reports that she is very hungry and ready to eat.  Currently patient is on a CLD. States she tolerated her CLD at lunch and was about to place an order for dinner.  PTA patient states that she has a pretty good appetite and was consuming lunch and dinner each day.  Patient with poor dentition, missing many of her back teeth.  Reports that she has a hard time chewing foods and has been consuming soups and Boost Plus.  Denies any issues swallowing.  Patient expressed concern for meeting her protein needs.      Nutrition Focused Physical Exam was completed.  Findings include mild fat depletion, mild muscle depletion, and no edema.  Patient reports that she has lost some weight since July 2016 when she received news about being evicted.  Patient states that she normally weighs around 120#.  Per chart  review, patient has had an 11# / 10% weight loss x 6 months, significant for the time frame.  Patient likely not meeting needs currently or PTA. Declined offers of Boost Breeze or Prostat while on CLD.  Will monitor for nutrition needs and provide Boost Plus upon diet advancement.  Medications reviewed.  Labs reviewed: glucose (55-110).   Diet Order:  Diet clear liquid Room service appropriate?: Yes; Fluid consistency:: Thin  Skin:  Reviewed, no issues  Last BM:  3/29  Height:   Ht Readings from Last 1 Encounters:  02/27/16 5' (1.524 m)    Weight:   Wt Readings from Last 1 Encounters:  02/28/16 102 lb 4.7 oz (46.4 kg)    Ideal Body Weight:  45.5 kg  BMI:  Body mass index is 19.98 kg/(m^2).  Estimated Nutritional Needs:   Kcal:  1250-1450  Protein:  50-60 grams  Fluid:  >/= 1.5L  EDUCATION NEEDS:   No education needs identified at this time  Doroteo Glassmanoleman Lillyahna Hemberger, Dietetic Intern Pager: [phone]

## 2016-02-28 NOTE — Transfer of Care (Signed)
Immediate Anesthesia Transfer of Care Note  Patient: Nichole [name]  Procedure(s) Performed: Procedure(s): ESOPHAGOGASTRODUODENOSCOPY (EGD) WITH PROPOFOL (N/A)  Patient Location: ENDO  Anesthesia Type:MAC  Level of Consciousness:  sedated, patient cooperative and responds to stimulation  Airway & Oxygen Therapy:Patient Spontanous Breathing and Patient connected to face mask oxgen  Post-op Assessment:  Report given to ENDO RN and Post -op Vital signs reviewed and stable  Post vital signs:  Reviewed and stable  Last Vitals:  Filed Vitals:   02/28/16 1126 02/28/16 1232  BP: 155/76 137/120  Pulse: 67 93  Temp: 37.1 C 37 C  Resp: 15 20    Complications: No apparent anesthesia complications

## 2016-02-29 ENCOUNTER — Encounter (HOSPITAL_COMMUNITY): Payer: Self-pay | Admitting: Gastroenterology

## 2016-02-29 DIAGNOSIS — K863 Pseudocyst of pancreas: Secondary | ICD-10-CM | POA: Insufficient documentation

## 2016-02-29 DIAGNOSIS — K859 Acute pancreatitis without necrosis or infection, unspecified: Secondary | ICD-10-CM

## 2016-02-29 LAB — LIPASE, BLOOD: LIPASE: 97 U/L — AB (ref 11–51)

## 2016-02-29 MED ORDER — ADULT MULTIVITAMIN W/MINERALS CH: 1.0000 | ORAL_TABLET | Freq: Every day | ORAL | Status: DC

## 2016-02-29 MED ORDER — PANTOPRAZOLE SODIUM 40 MG PO TBEC: 40.0000 mg | DELAYED_RELEASE_TABLET | Freq: Two times a day (BID) | ORAL | Status: DC

## 2016-02-29 MED ORDER — FOLIC ACID 1 MG PO TABS: 1.0000 mg | ORAL_TABLET | Freq: Every day | ORAL | Status: DC

## 2016-02-29 MED ORDER — THIAMINE HCL 100 MG PO TABS: 100.0000 mg | ORAL_TABLET | Freq: Every day | ORAL | Status: DC

## 2016-02-29 NOTE — Progress Notes (Signed)
Pt discharging to [facility] today. RN states pt requested to speak with CSW. CSW met with pt at bedside and pt had questions regarding applying for Medicaid. CSW explained that pt would have to apply for Medicaid through Department [employer] Social [employer]. Contact information provided to pt with address and phone number for [employer]. Pt inquired about her follow up recommended by MD and CSW notified pt of recommendation for follow up with NP. Pt states NP on H&P is through [facility] and only assist with her medications. CSW notified RNCM in order to assist with arrangements for [facility] follow up. Pt provided bus pass for transport at discharge.   No further social work needs identified.  CSW signing off.   Alison Murray, MSW, [place] Work [phone]

## 2016-02-29 NOTE — Progress Notes (Signed)
[facility] Progress Note  Subjective:  Feels good.  Eating solid food for breakfast currently.  Anxious to be discharged, but better now that she can have food.  Objective:  Vital signs in last 24 hours: Temp:  [97.5 F (36.4 C)-98.7 F (37.1 C)] 97.9 F (36.6 C) (03/31 0506) Pulse Rate:  [67-93] 81 (03/31 0506) Resp:  [15-20] 18 (03/31 0506) BP: (137-155)/(75-120) 137/77 mmHg (03/31 0506) SpO2:  [96 %-100 %] 100 % (03/31 0506) Last BM Date: 02/27/16 General:  Alert, Well-developed, in NAD Heart:  Regular rate and rhythm; no murmurs Pulm:  CTAB.  No W/R/R. Abdomen:  Soft, non-distended.  BS present.  Mild epigastric TTP.   Extremities:  Without edema. Neurologic:  Alert and oriented x 4; grossly normal neurologically. Psych:  Alert and cooperative. Normal mood and affect.  Intake/Output from previous day: 03/30 0701 - 03/31 0700 In: 3335 [P.O.:1135; I.V.:2200] Out: -   Lab Results:  Recent Labs  02/27/16 1013 02/27/16 1916 02/28/16 0322 02/28/16 0550  WBC 7.6  --  3.5*  --   HGB 15.2* 12.6 12.4 12.1  HCT 45.4 37.9 37.9 36.4  PLT 262  --  210  --    BMET  Recent Labs  02/27/16 1057 02/28/16 0322  NA 143 143  K 4.2 3.5  CL 107 108  CO2 27 23  GLUCOSE 110* 55*  BUN 10 6  CREATININE 0.44 0.55  CALCIUM 8.7* 9.0   LFT  Recent Labs  02/28/16 0322  PROT 6.5  ALBUMIN 3.0*  AST 19  ALT 10*  ALKPHOS 52  BILITOT 1.0   PT/INR  Recent Labs  02/27/16 1057  LABPROT 14.1  INR 1.07   Ct Abdomen Pelvis W Contrast  02/27/2016  CLINICAL DATA:  Epigastric pain since this morning. Nausea and vomiting. Question pancreatitis. History prior pancreatitis. EXAM: CT ABDOMEN AND PELVIS WITH CONTRAST TECHNIQUE: Multidetector CT imaging of the abdomen and pelvis was performed using the standard protocol following bolus administration of intravenous contrast. CONTRAST:  100mL ISOVUE-300 IOPAMIDOL (ISOVUE-300) INJECTION 61% COMPARISON:  Plain film of  earlier today and CT of 01/01/2011 FINDINGS: Lower chest: Clear lung bases. Normal heart size without pericardial or pleural effusion. Hepatobiliary: Normal liver. Normal gallbladder. No intrahepatic biliary duct dilatation. The pancreatic duct is not well visualized secondary mass effect by the dominant porta hepatis lesion. No intrahepatic duct dilatation identified. Pancreas: Mild pancreatic atrophy. Pancreatic parenchymal calcifications, consistent with chronic calcific pancreatitis. Moderate peripancreatic edema throughout. Pancreas enhances normally. The pancreatic head is displaced anteriorly by a complex lesion within the portocaval space which measures 4.5 x 6.1 cm on image 18/series 2. This is at the site of a 1.9 x 2.0 cm lesion on 01/01/2011. Although there is hyperattenuation about the medial portion of this lesion, there is no enhancement to strongly suggest pseudoaneurysm. Spleen: Normal in size, without focal abnormality. Adrenals/Urinary Tract: Normal adrenal glands. Normal kidneys, without hydronephrosis. Normal urinary bladder. Stomach/Bowel: Proximal gastric underdistention. Probable concurrent gastric wall thickening, including a 2.7 cm greater curvature on image 8/series 2. The gastric antrum is displaced anteriorly, but not obstructed. Normal colon and terminal ileum. Normal small bowel. Vascular/Lymphatic: Advanced aortic and branch vessel atherosclerosis. No splenic artery pseudoaneurysm. No abdominopelvic adenopathy. Reproductive: Retroverted uterus. Suspect a fundal fibroid, including image 54/series 2. No adnexal mass. Other: Small volume cul-de-sac fluid is minimally complex and may be secondary to pancreatitis. No significant abdominal ascites. Musculoskeletal: No acute osseous abnormality. IMPRESSION: 1. Acute on chronic pancreatitis.  2. Minimal enlargement of a a complex "Mass" in the portacaval space since 2012. 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 There are no specific features to  suggest pseudoaneurysm or aneurysm. However, if aspiration is considered, ultrasound characterization suggested to exclude unlikely vascular etiology. 3. Small volume cul-de-sac fluid is minimally complex and may be secondary to pancreatitis. 4. Significantly age advanced atherosclerosis. 5. Suspicion of the uterine fundal fibroid. 6. No calcified gallstones. The common duct is not well evaluated secondary to mass effect from the dominant mass. No intrahepatic ductal dilatation to strongly suggest biliary obstruction. 7. Gastric underdistention with suspicion of concurrent gastric wall thickening. Correlate with symptoms of gastritis. Electronically Signed   By: Jeronimo Greaves M.D.   On: 02/27/2016 15:11   Dg Abd Acute W/chest  02/27/2016  CLINICAL DATA:  O woke this morning with moderate to severe mid abdominal pain, nausea, vomiting. EXAM: DG ABDOMEN ACUTE W/ 1V CHEST COMPARISON:  Abdomen CT 01/01/2011.  Chest x-ray 02/13/2008 FINDINGS: Hyperinflation of the lungs. No focal opacities or effusions. Heart is normal size. Calcifications noted in the right medial abdomen medial to the right kidney. This could be within the pancreatic head region. Nonobstructive bowel gas pattern. No free air organomegaly. No acute bony abnormality. IMPRESSION: Calcifications in the medial right abdomen which could be within the region of the pancreatic head. No evidence of bowel obstruction or free air. Hyperinflation of the lungs.  No active cardiopulmonary disease. Electronically Signed   By: Charlett Nose M.D.   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 Lipase improved today at 97. -Hematemesis with hemoccult positive stools:  EGD 3/30 only with benign appearing esophageal stenosis that was dilated.  Hgb normal/stable. -Macrocytosis: MCV 104.8. Folate was low in the past.  *Ok for discharge later today on low fat diet if  tolerates food in house. *Follow-up scheduled with Dr. Lavon Paganini 6/1 at 8:30 am (in D/C instructions).   LOS: 1 day   ZEHR, JESSICA D.  02/29/2016, 8:59 AM  Pager number [phone]    Attending physician's note   I have taken an interval history, reviewed the chart and examined the patient. I agree with the Advanced Practitioner's note, impression and recommendations. Hgb stable. Acute on chronic pancreatitis with ?complex pseudo cyst likely in the setting of chronic alcohol abuse. Advised patient to quit ETOH. F/u imaging in 6-8 weeks (MRCP vs EUS). Follow up in office   K Scherry Ran, MD [phone] Mon-Fri 8a-5p [phone] after 5p, weekends, holidays

## 2016-02-29 NOTE — Discharge Summary (Signed)
Physician Discharge Summary  Nichole [name]  ZOX:[id number]  DOB: 08-25-1961  DOA: 02/27/2016  PCP: Jacklynn Barnacle, NP  Admit date: 02/27/2016 Discharge date: 02/29/2016  Time spent: Greater than 30 minutes  Recommendations for Outpatient Follow-up:  1. [facility], NP/PCP in 1 week with repeat labs (CBC & BMP). 2. Dr. Marsa Aris, [facility]: MDs office will call patient with follow-up appointment. 3. Patient may need repeat imaging of her abdomen (MRCP or EUS) after alcohol abstinence to follow-up on abnormal "mass" seen on CT (as discussed with [facility]).  Discharge Diagnoses:  Active Problems:   TOBACCO USE   Essential hypertension   Acute pancreatitis   Alcohol abuse   Hematemesis   Esophageal stricture   Discharge Condition: Improved & Stable  Diet recommendation: Low-fat diet.  Filed Weights   02/27/16 0935 02/28/16 8119  Weight: 48.988 kg (108 lb) 46.4 kg (102 lb 4.7 oz)    History of present illness:  55 year old female with history of HTN, presumed alcoholic pancreatitis in 2012, alcohol abuse, anemia, tobacco abuse, HTN, presented to the ED with complaints of acute on chronic abdominal pain which had been going on for last 2 months with associated nausea and vomiting with some blood and dark stools. In the ED, lipase 864. Admitted for acute pancreatitis and possible UGIB. [facility] consulted and she underwent EGD 3/30.  Hospital Course:   Acute on chronic pancreatitis, likely alcoholic - Presented with lipase of 864, increased to 1198 the next day. LFTs unremarkable making biliary etiology less likely. Triglycerides unremarkable. Lipase has significantly decreased to 97 on 3/31. - CT abdomen and pelvis shows acute on chronic pancreatitis and minimal enlargement of a complex "mass" in the portacaval space since 2012. - Alcohol abstinence counseled and she verbalized understanding. - Treating supportively with IV fluids, bowel rest, pain medications and  advance diet as tolerated. - Clinically improved. No pain reported. Tolerating diet.  Acute on chronic abdominal pain - Likely related to pancreatitis. EGD without findings to explain her abdominal pain. - Has denied abdominal pain for the last 24 hours.  ? Acute upper GI bleed - Hemoglobin stable. FOBT +. - [facility] was consulted. - Patient underwent EGD 3/30 with detailed report as below. In summary, EGD showed benign-appearing esophageal stenosis that was dilated and GI recommends continue twice a day PPI.  Abnormal CT abdomen/complex "mass" in the portacaval space - As per CT report, minimal enlargement of a complex "mass" in the portacaval space since 2012. Most likely a complex cyst. There are no specific features to suggest pseudoaneurysm or aneurysm.? Need for EUS. - Outpatient follow-up as deemed necessary. - Discussed with Dr. Lavon Paganini, GI who indicates that after a period of alcohol abstinence, the lesions seen on CT can be followed up by repeat imaging with MRCP or EUS. There are offices will arrange outpatient follow-up.  Macrocytosis  - Likely related to alcohol abuse.   Essential hypertension - Controlled. Continue metoprolol.  Alcohol dependence - Patient states that she drinks couple of glasses of wine some days a week and 4-5 mixed drinks on weekends (Friday, Saturday & Sunday). Appears to understate her drinking. - Abstinence counseled. - No overt alcohol withdrawal features. Again counseled extensively regarding alcohol abstinence in the presence of her RN in room today. She verbalized understanding. Continue thiamine, folate and multivitamins.  Tobacco abuse - Cessation counseled. Patient declined nicotine patch.  Benign-appearing esophageal stenosis  - Seen on EGD and status post dilatation 02/28/16.    Consultants:  [facility]  Procedures:  EGD 02/28/16: Impression: - Benign-appearing esophageal stenosis.  Dilated.  - Normal stomach.  - Normal examined duodenum.  - No specimens collected.  Recommendation: - Patient has a contact number available for   emergencies. The signs and symptoms of potential   delayed complications were discussed with the   patient. Return to normal activities tomorrow.   Written discharge instructions were provided to the   patient.  - Advance diet as tolerated today.  - Continue present medications. (PPI twice daily)  - Repeat upper endoscopy PRN.  - Return to GI clinic PRN.   Discharge Exam:  Complaints: No abdominal pain, nausea, vomiting or BM reported.   Filed Vitals:   02/28/16 1232 02/28/16 1431 02/28/16 2134 02/29/16 0506  BP: 137/120 152/76 148/75 137/77  Pulse: 93 68 82 81  Temp: 98.6 F (37 C) 97.5 F (36.4 C) 98.4 F (36.9 C) 97.9 F (36.6 C)  TempSrc: Oral Oral Oral Oral  Resp: Height:      Weight:      SpO2: 96% 100% 100% 100%    General exam: Pleasant middle-aged female sitting up comfortably in bed this morning. Respiratory system: Clear. No increased work of breathing. Cardiovascular system: S1 & S2 heard, RRR. No JVD, murmurs, gallops, clicks or pedal edema. Gastrointestinal system: Abdomen is nondistended, soft and nontender. Normal bowel sounds heard. Central nervous system: Alert and oriented. No focal neurological deficits. Extremities: Symmetric 5 x 5 power.  Discharge Instructions      Discharge Instructions    Call MD for:  difficulty breathing, headache or visual disturbances    Complete by:  As directed      Call MD for:  extreme fatigue    Complete by:  As directed      Call MD for:   persistant dizziness or light-headedness    Complete by:  As directed      Call MD for:  persistant nausea and vomiting    Complete by:  As directed      Call MD for:  severe uncontrolled pain    Complete by:  As directed      Call MD for:  temperature >100.4    Complete by:  As directed      Call MD for:    Complete by:  As directed   Vomiting blood or coffee-ground material or having black tarry stools.     Discharge instructions    Complete by:  As directed   Diet: Low-fat diet as per instructions provided.     Increase activity slowly    Complete by:  As directed             Medication List    TAKE these medications        folic acid 1 MG tablet  Commonly known as:  FOLVITE  Take 1 tablet (1 mg total) by mouth daily.     metoprolol tartrate 25 MG tablet  Commonly known as:  LOPRESSOR  Take 25 mg by mouth 2 (two) times daily.     multivitamin with minerals Tabs tablet  Take 1 tablet by mouth daily.     pantoprazole 40 MG tablet  Commonly known as:  PROTONIX  Take 1 tablet (40 mg total) by mouth 2 (two) times daily before a meal.     thiamine 100 MG tablet  Take 1 tablet (100 mg total) by mouth daily.       Follow-up Information  Follow up with [facility], NP. Schedule an appointment as soon as possible for a visit in 1 week.   Why:  To be seen with repeat labs (CBC & BMP).   Contact information:   [address] [place] [address] [phone]       Schedule an appointment as soon as possible for a visit with Marsa Aris, MD.   Specialty:  Gastroenterology   Why:  MD's office will call you and arrange follow up.   Contact information:   [address] [place] [address] [phone]       Get Medicines reviewed and adjusted: Please take all your medications with you for your next visit with your Primary MD  Please request your Primary MD to go over all hospital tests and procedure/radiological results at the follow up. Please  ask your Primary MD to get all Hospital records sent to his/her office.  If you experience worsening of your admission symptoms, develop shortness of breath, life threatening emergency, suicidal or homicidal thoughts you must seek medical attention immediately by calling 911 or calling your MD immediately if symptoms less severe.  You must read complete instructions/literature along with all the possible adverse reactions/side effects for all the Medicines you take and that have been prescribed to you. Take any new Medicines after you have completely understood and accept all the possible adverse reactions/side effects.   Do not drive when taking pain medications.   Do not take more than prescribed Pain, Sleep and Anxiety Medications  Special Instructions: If you have smoked or chewed Tobacco in the last 2 yrs please stop smoking, stop any regular Alcohol and or any Recreational drug use.  Wear Seat belts while driving.  Please note  You were cared for by a hospitalist during your hospital stay. Once you are discharged, your primary care physician will handle any further medical issues. Please note that NO REFILLS for any discharge medications will be authorized once you are discharged, as it is imperative that you return to your primary care physician (or establish a relationship with a primary care physician if you do not have one) for your aftercare needs so that they can reassess your need for medications and monitor your lab values.    The results of significant diagnostics from this hospitalization (including imaging, microbiology, ancillary and laboratory) are listed below for reference.    Significant Diagnostic Studies: Ct Abdomen Pelvis W Contrast  02/27/2016  CLINICAL DATA:  Epigastric pain since this morning. Nausea and vomiting. Question pancreatitis. History prior pancreatitis. EXAM: CT ABDOMEN AND PELVIS WITH CONTRAST TECHNIQUE: Multidetector CT imaging of the abdomen and  pelvis was performed using the standard protocol following bolus administration of intravenous contrast. CONTRAST:  ISOVUE-300 IOPAMIDOL (ISOVUE-300) INJECTION 61% COMPARISON:  Plain film of earlier today and CT of 01/01/2011 FINDINGS: Lower chest: Clear lung bases. Normal heart size without pericardial or pleural effusion. Hepatobiliary: Normal liver. Normal gallbladder. No intrahepatic biliary duct dilatation. The pancreatic duct is not well visualized secondary mass effect by the dominant porta hepatis lesion. No intrahepatic duct dilatation identified. Pancreas: Mild pancreatic atrophy. Pancreatic parenchymal calcifications, consistent with chronic calcific pancreatitis. Moderate peripancreatic edema throughout. Pancreas enhances normally. The pancreatic head is displaced anteriorly by a complex lesion within the portocaval space which measures 4.5 x 6.1 cm on image 18/series 2. This is at the site of a 1.9 x 2.0 cm lesion on 01/01/2011. Although there is hyperattenuation about the medial portion of this lesion, there  is no enhancement to strongly suggest pseudoaneurysm. Spleen: Normal in size, without focal abnormality. Adrenals/Urinary Tract: Normal adrenal glands. Normal kidneys, without hydronephrosis. Normal urinary bladder. Stomach/Bowel: Proximal gastric underdistention. Probable concurrent gastric wall thickening, including a 2.7 cm greater curvature on image 8/series 2. The gastric antrum is displaced anteriorly, but not obstructed. Normal colon and terminal ileum. Normal small bowel. Vascular/Lymphatic: Advanced aortic and branch vessel atherosclerosis. No splenic artery pseudoaneurysm. No abdominopelvic adenopathy. Reproductive: Retroverted uterus. Suspect a fundal fibroid, including image 54/series 2. No adnexal mass. Other: Small volume cul-de-sac fluid is minimally complex and may be secondary to pancreatitis. No significant abdominal ascites. Musculoskeletal: No acute osseous abnormality.  IMPRESSION: 1. Acute on chronic pancreatitis. 2. Minimal enlargement of a a complex "Mass" in the portacaval space since 2012. 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 There are no specific features to suggest pseudoaneurysm or aneurysm. However, if aspiration is considered, ultrasound characterization suggested to exclude unlikely vascular etiology. 3. Small volume cul-de-sac fluid is minimally complex and may be secondary to pancreatitis. 4. Significantly age advanced atherosclerosis. 5. Suspicion of the uterine fundal fibroid. 6. No calcified gallstones. The common duct is not well evaluated secondary to mass effect from the dominant mass. No intrahepatic ductal dilatation to strongly suggest biliary obstruction. 7. Gastric underdistention with suspicion of concurrent gastric wall thickening. Correlate with symptoms of gastritis. Electronically Signed   By: Jeronimo Greaves M.D.   On: 02/27/2016 15:11   Dg Abd Acute W/chest  02/27/2016  CLINICAL DATA:  O woke this morning with moderate to severe mid abdominal pain, nausea, vomiting. EXAM: DG ABDOMEN ACUTE W/ 1V CHEST COMPARISON:  Abdomen CT 01/01/2011.  Chest x-ray 02/13/2008 FINDINGS: Hyperinflation of the lungs. No focal opacities or effusions. Heart is normal size. Calcifications noted in the right medial abdomen medial to the right kidney. This could be within the pancreatic head region. Nonobstructive bowel gas pattern. No free air organomegaly. No acute bony abnormality. IMPRESSION: Calcifications in the medial right abdomen which could be within the region of the pancreatic head. No evidence of bowel obstruction or free air. Hyperinflation of the lungs.  No active cardiopulmonary disease. Electronically Signed   By: Charlett Nose M.D.   On: 02/27/2016 09:57    Microbiology: No results found for this or any previous visit (from the past 240 hour(s)).   Labs: Basic Metabolic Panel:  Recent Labs Lab 02/27/16 1057 02/28/16 0322  NA 143 143  K 4.2 3.5  CL  107 108  CO2 27 23  GLUCOSE 110* 55*  BUN 10 6  CREATININE 0.44 0.55  CALCIUM 8.7* 9.0   Liver Function Tests:  Recent Labs Lab 02/27/16 1057 02/28/16 0322  AST 18 19  ALT 11* 10*  ALKPHOS 54 52  BILITOT 0.7 1.0  PROT 6.7 6.5  ALBUMIN 3.4* 3.0*    Recent Labs Lab 02/27/16 1057 02/28/16 0322 02/29/16 0540  LIPASE 864* 1198* 97*   No results for input(s): AMMONIA in the last 168 hours. CBC:  Recent Labs Lab 02/27/16 1013 02/27/16 1916 02/28/16 0322 02/28/16 0550  WBC 7.6  --  3.5*  --   NEUTROABS 6.4  --   --   --   HGB 15.2* 12.6 12.4 12.1  HCT 45.4 37.9 37.9 36.4  MCV 104.8*  --  107.4*  --   PLT 262  --  210  --    Cardiac Enzymes: No results for input(s): CKTOTAL, CKMB, CKMBINDEX, TROPONINI in the last 168 hours. BNP: BNP (last 3 results)  No results for input(s): BNP in the last 8760 hours.  ProBNP (last 3 results) No results for input(s): PROBNP in the last 8760 hours.  CBG:  Recent Labs Lab 02/28/16 2132  GLUCAP 90       Signed:  Marcellus ScottHONGALGI,Mickle Campton, MD, FACP, FHM. [facility] Pager [phone]  If 7PM-7AM, please contact night-coverage [email or web address] Password [email or web address] 02/29/2016, 11:02 AM

## 2016-02-29 NOTE — Progress Notes (Signed)
This CM was contacted by CSW to inform me that [facility].  Pt states [facility] but she only does medications and doesn't think she does blood draws (MD wrote on DC summary for pt to have follow up blood draws.)  [facility]/u appointment for pt, and was unsuccessful.  Pt [facility] states for pt to call on Monday to make an appointment and to indicate that she just discharged from the hospital.  Pt states she will do this.  All above info given to pt and RN.  [facility] contact info placed on AVS.  No other CM needs communicated. Sandford Crazeora Kelena Garrow RN,BSN,NCM [phone]

## 2016-02-29 NOTE — Discharge Instructions (Signed)
Acute Pancreatitis Acute pancreatitis is a disease in which the pancreas becomes suddenly inflamed. The pancreas is a large gland located behind your stomach. The pancreas produces enzymes that help digest food. The pancreas also releases the hormones glucagon and insulin that help regulate blood sugar. Damage to the pancreas occurs when the digestive enzymes from the pancreas are activated and begin attacking the pancreas before being released into the intestine. Most acute attacks last a couple of days and can cause serious complications. Some people become dehydrated and develop low blood pressure. In severe cases, bleeding into the pancreas can lead to shock and can be life-threatening. The lungs, heart, and kidneys may fail. CAUSES  Pancreatitis can happen to anyone. In some cases, the cause is unknown. Most cases are caused by:  Alcohol abuse.  Gallstones. Other less common causes are:  Certain medicines.  Exposure to certain chemicals.  Infection.  Damage caused by an accident (trauma).  Abdominal surgery. SYMPTOMS   Pain in the upper abdomen that may radiate to the back.  Tenderness and swelling of the abdomen.  Nausea and vomiting. DIAGNOSIS  Your caregiver will perform a physical exam. Blood and stool tests may be done to confirm the diagnosis. Imaging tests may also be done, such as X-rays, CT scans, or an ultrasound of the abdomen. TREATMENT  Treatment usually requires a stay in the hospital. Treatment may include:  Pain medicine.  Fluid replacement through an intravenous line (IV).  Placing a tube in the stomach to remove stomach contents and control vomiting.  Not eating for 3 or 4 days. This gives your pancreas a rest, because enzymes are not being produced that can cause further damage.  Antibiotic medicines if your condition is caused by an infection.  Surgery of the pancreas or gallbladder. HOME CARE INSTRUCTIONS   Follow the diet advised by your  caregiver. This may involve avoiding alcohol and decreasing the amount of fat in your diet.  Eat smaller, more frequent meals. This reduces the amount of digestive juices the pancreas produces.  Drink enough fluids to keep your urine clear or pale yellow.  Only take over-the-counter or prescription medicines as directed by your caregiver.  Avoid drinking alcohol if it caused your condition.  Do not smoke.  Get plenty of rest.  Check your blood sugar at home as directed by your caregiver.  Keep all follow-up appointments as directed by your caregiver. SEEK MEDICAL CARE IF:   You do not recover as quickly as expected.  You develop new or worsening symptoms.  You have persistent pain, weakness, or nausea.  You recover and then have another episode of pain. SEEK IMMEDIATE MEDICAL CARE IF:   You are unable to eat or keep fluids down.  Your pain becomes severe.  You have a fever or persistent symptoms for more than 2 to 3 days.  You have a fever and your symptoms suddenly get worse.  Your skin or the white part of your eyes turn yellow (jaundice).  You develop vomiting.  You feel dizzy, or you faint.  Your blood sugar is high (over 300 mg/dL). MAKE SURE YOU:   Understand these instructions.  Will watch your condition.  Will get help right away if you are not doing well or get worse.   This information is not intended to replace advice given to you by your health care provider. Make sure you discuss any questions you have with your health care provider.   Document Released: 11/17/2005 Document Revised: 05/18/2012  Document Reviewed: 02/26/2012 Elsevier Interactive Patient Education 2016 Elsevier Inc.  Low-Fat Diet for Pancreatitis or Gallbladder Conditions A low-fat diet can be helpful if you have pancreatitis or a gallbladder condition. With these conditions, your pancreas and gallbladder have trouble digesting fats. A healthy eating plan with less fat will help  rest your pancreas and gallbladder and reduce your symptoms. WHAT DO I NEED TO KNOW ABOUT THIS DIET?  Eat a low-fat diet.  Reduce your fat intake to less than 20-30% of your total daily calories. This is less than 50-60 g of fat per day.  Remember that you need some fat in your diet. Ask your dietician what your daily goal should be.  Choose nonfat and low-fat healthy foods. Look for the words "nonfat," "low fat," or "fat free."  As a guide, look on the label and choose foods with less than 3 g of fat per serving. Eat only one serving.  Avoid alcohol.  Do not smoke. If you need help quitting, talk with your health care provider.  Eat small frequent meals instead of three large heavy meals. WHAT FOODS CAN I EAT? Grains Include healthy grains and starches such as potatoes, wheat bread, fiber-rich cereal, and brown rice. Choose whole grain options whenever possible. In adults, whole grains should account for 45-65% of your daily calories.  Fruits and Vegetables Eat plenty of fruits and vegetables. Fresh fruits and vegetables add fiber to your diet. Meats and Other Protein Sources Eat lean meat such as chicken and pork. Trim any fat off of meat before cooking it. Eggs, fish, and beans are other sources of protein. In adults, these foods should account for 10-35% of your daily calories. Dairy Choose low-fat milk and dairy options. Dairy includes fat and protein, as well as calcium.  Fats and Oils Limit high-fat foods such as fried foods, sweets, baked goods, sugary drinks.  Other Creamy sauces and condiments, such as mayonnaise, can add extra fat. Think about whether or not you need to use them, or use smaller amounts or low fat options. WHAT FOODS ARE NOT RECOMMENDED?  High fat foods, such as:  [employer].  Ice cream.  [place] toast.  Sweet rolls.  Pizza.  Cheese bread.  Foods covered with batter, butter, creamy sauces, or cheese.  Fried foods.  Sugary drinks and  desserts.  Foods that cause gas or bloating   This information is not intended to replace advice given to you by your health care provider. Make sure you discuss any questions you have with your health care provider.   Document Released: 11/22/2013 Document Reviewed: 11/22/2013 Elsevier Interactive Patient Education 2016 Elsevier Inc.  Chemical Dependency Chemical dependency is an addiction to drugs or alcohol. It is characterized by the repeated behavior of seeking out and using drugs and alcohol despite harmful consequences to the health and safety of ones self and others.  RISK FACTORS There are certain situations or behaviors that increase a person's risk for chemical dependency. These include:  A family history of chemical dependency.  A history of mental health issues, including depression and anxiety.  A home environment where drugs and alcohol are easily available to you.  Drug or alcohol use at a young age. SYMPTOMS  The following symptoms can indicate chemical dependency:  Inability to limit the use of drugs or alcohol.  Nausea, sweating, shakiness, and anxiety that occurs when alcohol or drugs are not being used.  An increase in amount of drugs or alcohol that is necessary to  get drunk or high. People who experience these symptoms can assess their use of drugs and alcohol by asking themselves the following questions:  Have you been told by friends or family that they are worried about your use of alcohol or drugs?  Do friends and family ever tell you about things you did while drinking alcohol or using drugs that you do not remember?  Do you lie about using alcohol or drugs or about the amounts you use?  Do you have difficulty completing daily tasks unless you use alcohol or drugs?  Is the level of your work or school performance lower because of your drug or alcohol use?  Do you get sick from using drugs or alcohol but keep using anyway?  Do you feel  uncomfortable in social situations unless you use alcohol or drugs?  Do you use drugs or alcohol to help forget problems? An answer of yes to any of these questions may indicate chemical dependency. Professional evaluation is suggested.   This information is not intended to replace advice given to you by your health care provider. Make sure you discuss any questions you have with your health care provider.   Document Released: 11/11/2001 Document Revised: 02/09/2012 Document Reviewed: 01/23/2011 Elsevier Interactive Patient Education [employer] Elsevier Inc.

## 2016-02-29 NOTE — Progress Notes (Signed)
Discharge instructions reviewed with patient, questions answered, verbalized understanding.  Patient received bus pass from SW.  Patient ambulatory to bus station [facility]

## 2016-03-25 DIAGNOSIS — K0889 Other specified disorders of teeth and supporting structures: Secondary | ICD-10-CM

## 2016-03-25 NOTE — Congregational Nurse Program (Signed)
Congregational Nurse Program Note  Date of Encounter: 03/25/2016  Past Medical History: Past Medical History  Diagnosis Date  . ANEMIA, B12 DEFICIENCY 04/05/2009    macrocytic  . TOBACCO USE 01/25/2008  . 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 HYPERTENSION 01/29/2008  . EXCESSIVE MENSTRUAL BLEEDING 03/18/2010    uterine fibroids. gyn eval and bx neg  . PERIMENOPAUSAL SYNDROME 08/23/2009  . LEG PAIN, BILATERAL 06/22/2008  . SYNCOPE 01/02/2009  . Palpitations 01/02/2009  . THYROID FUNCTION TEST, ABNORMAL 03/19/2009  . Acute pancreatitis 12/05/2010, 01/2016    with complex pseudocyst, attributed to ETOH.  2017: pseudocyst, acute on chronic pancreatitis. pancreatic calcifications.  . WEIGHT LOSS-ABNORMAL 12/05/2010  . RASH 12/23/2010  . Hematemesis 01/2016    in setting pancreatitispseudocyst.    Encounter Details:     CNP Questionnaire - 03/25/16 2335    Patient Demographics   Is this a new or existing patient? New   Patient is considered a/an Not Applicable   Race African-American/Black   Patient Assistance   Location of Patient Assistance Not Applicable   Patient's financial/insurance status Self-Pay   Uninsured Patient Yes   Interventions Follow-up/Education/Support provided after completed appt.   Patient referred to apply for the following financial assistance Not Applicable   Food insecurities addressed Not Applicable   Transportation assistance No   Assistance securing medications No   Educational health offerings Other   Encounter Details   Primary purpose of visit Acute Illness/Condition Visit;Education/Health Concerns;Spiritual Care/Support Visit;Navigating the Healthcare System   Was an Emergency Department visit averted? Not Applicable   Does patient have a medical provider? Yes   Patient referred to Not Applicable   Was a mental health screening completed? (GAINS tool) No   Does patient have dental issues? Yes   Was a dental referral made?  Yes   Does patient have vision issues? Yes   Was a vision referral made? Yes   Since previous encounter, have you referred patient for abnormal blood pressure that resulted in a new diagnosis or medication change? No   Since previous encounter, have you referred patient for abnormal blood glucose that resulted in a new diagnosis or medication change? No   For Abstraction Use Only   Does patient have insurance? No     Initial  Visit  For  Client ,in requesting assistance with  Vision and in severe dental pain. Nurse talked with  Client about  [employer] [facility] and to  Make sure she connects with that service to  Have her eyes checked . 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 Worked about  10 years in that field.States she now  Has  Blood  Pressure  Problems but  Takes her medications ,understands the importance.States  She has lost weight from 133 down to 105 lbs,mainly because of he dental pain eats soft foods  Only.No family support , has a living mother and ,2 brothers and 1 sister but they will not help  Her . Client would not  Say  Why stated she just doesn't know why. Her mother lives in [place] ,[place]  And she has been on her own since age 55 years. Client offered support  And listened as she  Shared her story. Shared weekly  Schedule and encouraged client to return . Nurse will  check on next  [facility] date and call for dental appointment.. Tc  To  Dentist  Appointment made for 04-02-16 9 am ,will notify  Client .and follow  Up  To  Make  Sure appointment is kept

## 2016-03-26 DIAGNOSIS — K0889 Other specified disorders of teeth and supporting structures: Secondary | ICD-10-CM

## 2016-03-26 NOTE — Congregational Nurse Program (Signed)
Congregational Nurse Program Note  Date of Encounter: 03/26/2016  Past Medical History: Past Medical History  Diagnosis Date  . ANEMIA, B12 DEFICIENCY 04/05/2009    macrocytic  . TOBACCO USE 01/25/2008  . 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 HYPERTENSION 01/29/2008  . EXCESSIVE MENSTRUAL BLEEDING 03/18/2010    uterine fibroids. gyn eval and bx neg  . PERIMENOPAUSAL SYNDROME 08/23/2009  . LEG PAIN, BILATERAL 06/22/2008  . SYNCOPE 01/02/2009  . Palpitations 01/02/2009  . THYROID FUNCTION TEST, ABNORMAL 03/19/2009  . Acute pancreatitis 12/05/2010, 01/2016    with complex pseudocyst, attributed to ETOH.  2017: pseudocyst, acute on chronic pancreatitis. pancreatic calcifications.  . WEIGHT LOSS-ABNORMAL 12/05/2010  . RASH 12/23/2010  . Hematemesis 01/2016    in setting pancreatitispseudocyst.    Encounter Details:     CNP Questionnaire - 03/26/16 1720    Patient Demographics   Is this a new or existing patient? Existing   Patient is considered a/an Not Applicable   Race African-American/Black   Patient Assistance   Location of Patient Assistance Not Applicable   Patient's financial/insurance status Self-Pay   Uninsured Patient Yes   Interventions Appt. has been completed   Patient referred to apply for the following financial assistance Not Applicable   Food insecurities addressed Not Applicable   Transportation assistance No   Assistance securing medications No   Educational health offerings Navigating the healthcare system   Encounter Details   Primary purpose of visit Acute Illness/Condition Visit;Education/Health Concerns;Spiritual Care/Support Visit   Was an Emergency Department visit averted? Not Applicable   Does patient have a medical provider? Yes   Patient referred to [facility]   Was a mental health screening completed? (GAINS tool) No   Does patient have dental issues? No   Was a dental referral made? Yes   Does patient have vision  issues? Yes   Was a vision referral made? Yes   Since previous encounter, have you referred patient for abnormal blood pressure that resulted in a new diagnosis or medication change? No   Since previous encounter, have you referred patient for abnormal blood glucose that resulted in a new diagnosis or medication change? No   For Abstraction Use Only   Does patient have insurance? No    Nurse  Saw  Client  In hallway confirmed  She could  Keep  Dental appointment ,reviewed  Address and time with  Client . Also informed  Client of  Need to  Visit  [facility]  On 03-27-16  To  Have vision checked and possibility  Of  Glasses. States she will go. To return to see the nurse after appointments.

## 2016-04-01 DIAGNOSIS — K0889 Other specified disorders of teeth and supporting structures: Secondary | ICD-10-CM

## 2016-04-01 NOTE — Congregational Nurse Program (Signed)
Congregational Nurse Program Note  Date of Encounter: 04/01/2016  Past Medical History: Past Medical History  Diagnosis Date  . ANEMIA, B12 DEFICIENCY 04/05/2009    macrocytic  . TOBACCO USE 01/25/2008  . 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 HYPERTENSION 01/29/2008  . EXCESSIVE MENSTRUAL BLEEDING 03/18/2010    uterine fibroids. gyn eval and bx neg  . PERIMENOPAUSAL SYNDROME 08/23/2009  . LEG PAIN, BILATERAL 06/22/2008  . SYNCOPE 01/02/2009  . Palpitations 01/02/2009  . THYROID FUNCTION TEST, ABNORMAL 03/19/2009  . Acute pancreatitis 12/05/2010, 01/2016    with complex pseudocyst, attributed to ETOH.  2017: pseudocyst, acute on chronic pancreatitis. pancreatic calcifications.  . WEIGHT LOSS-ABNORMAL 12/05/2010  . RASH 12/23/2010  . Hematemesis 01/2016    in setting pancreatitispseudocyst.    Encounter Details:     CNP Questionnaire - 04/01/16 1749    Patient Demographics   Is this a new or existing patient? Existing   Patient is considered a/an Not Applicable   Race African-American/Black   Patient Assistance   Location of Patient Assistance Not Applicable   Patient's financial/insurance status Self-Pay   Uninsured Patient Yes   Interventions Follow-up/Education/Support provided after completed appt.   Educational health offerings Medications   Encounter Details   Primary purpose of visit Acute Illness/Condition Visit   Was an Emergency Department visit averted? Not Applicable   Does patient have a medical provider? Yes   Patient referred to [facility]   Was a mental health screening completed? (GAINS tool) No   Does patient have dental issues? Yes   Was a dental referral made? Yes   Does patient have vision issues? Yes   Was a vision referral made? Yes   Since previous encounter, have you referred patient for abnormal blood pressure that resulted in a new diagnosis or medication change? No   Since previous encounter, have you referred patient for  abnormal blood glucose that resulted in a new diagnosis or medication change? No   For Abstraction Use Only   Does patient have insurance? No     TC  Form  Dentist  To  Remind  Client of dental appointment  For tomorrow . Nurse will place  Note in clients box to  Remind  Her. Client works part time [employer]

## 2016-04-02 DIAGNOSIS — K0889 Other specified disorders of teeth and supporting structures: Secondary | ICD-10-CM

## 2016-04-02 NOTE — Congregational Nurse Program (Signed)
Congregational Nurse Program Note  Date of Encounter: 04/02/2016  Past Medical History: Past Medical History  Diagnosis Date  . ANEMIA, B12 DEFICIENCY 04/05/2009    macrocytic  . TOBACCO USE 01/25/2008  . 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 HYPERTENSION 01/29/2008  . EXCESSIVE MENSTRUAL BLEEDING 03/18/2010    uterine fibroids. gyn eval and bx neg  . PERIMENOPAUSAL SYNDROME 08/23/2009  . LEG PAIN, BILATERAL 06/22/2008  . SYNCOPE 01/02/2009  . Palpitations 01/02/2009  . THYROID FUNCTION TEST, ABNORMAL 03/19/2009  . Acute pancreatitis 12/05/2010, 01/2016    with complex pseudocyst, attributed to ETOH.  2017: pseudocyst, acute on chronic pancreatitis. pancreatic calcifications.  . WEIGHT LOSS-ABNORMAL 12/05/2010  . RASH 12/23/2010  . Hematemesis 01/2016    in setting pancreatitispseudocyst.    Encounter Details:     CNP Questionnaire - 04/02/16 1823    Patient Demographics   Is this a new or existing patient? Existing   Patient is considered a/an Not Applicable   Race African-American/Black   Patient Assistance   Location of Patient Assistance Not Applicable   Patient's financial/insurance status Self-Pay   Uninsured Patient Yes   Interventions Appt. has been completed   Patient referred to apply for the following financial assistance Medicaid   Food insecurities addressed Not Applicable   Transportation assistance No   Assistance securing medications No   Educational health offerings Navigating the healthcare system;Nutrition   Encounter Details   Primary purpose of visit Acute Illness/Condition Visit;Education/Health Concerns;Spiritual Care/Support Visit   Was an Emergency Department visit averted? Not Applicable   Does patient have a medical provider? Yes   Patient referred to [facility]   Was a mental health screening completed? (GAINS tool) No   Does patient have dental issues? Yes   Was a dental referral made? Yes   Does patient have vision  issues? Yes   Was a vision referral made? Yes   Since previous encounter, have you referred patient for abnormal blood pressure that resulted in a new diagnosis or medication change? No   Since previous encounter, have you referred patient for abnormal blood glucose that resulted in a new diagnosis or medication change? No   For Abstraction Use Only   Does patient have insurance? No    Client in dental appointment kept was pleased with  Care. He will work with her to complete dental care and get hear partial. Client also kept appointment and [facility] and will now apply  For assistance at DSS. Nurse complimented client on her follow  Through of recommendations to  Get her health care in line.Very humble  Lady . Nurse will continue to follow up with client . Spoke with  Case management and they are researching housing  For client.

## 2016-04-16 DIAGNOSIS — K0889 Other specified disorders of teeth and supporting structures: Secondary | ICD-10-CM

## 2016-04-16 NOTE — Congregational Nurse Program (Signed)
Congregational Nurse Program Note  Date of Encounter: 04/16/2016  Past Medical History: Past Medical History  Diagnosis Date  . ANEMIA, B12 DEFICIENCY 04/05/2009    macrocytic  . TOBACCO USE 01/25/2008  . 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 HYPERTENSION 01/29/2008  . EXCESSIVE MENSTRUAL BLEEDING 03/18/2010    uterine fibroids. gyn eval and bx neg  . PERIMENOPAUSAL SYNDROME 08/23/2009  . LEG PAIN, BILATERAL 06/22/2008  . SYNCOPE 01/02/2009  . Palpitations 01/02/2009  . THYROID FUNCTION TEST, ABNORMAL 03/19/2009  . Acute pancreatitis 12/05/2010, 01/2016    with complex pseudocyst, attributed to ETOH.  2017: pseudocyst, acute on chronic pancreatitis. pancreatic calcifications.  . WEIGHT LOSS-ABNORMAL 12/05/2010  . RASH 12/23/2010  . Hematemesis 01/2016    in setting pancreatitispseudocyst.    Encounter Details:     CNP Questionnaire - 04/16/16 1840    Patient Demographics   Is this a new or existing patient? Existing   Patient is considered a/an Not Applicable   Race African-American/Black   Patient Assistance   Location of Patient Assistance Not Applicable   Patient's financial/insurance status Self-Pay   Uninsured Patient Yes   Interventions Appt. has been completed;Follow-up/Education/Support provided after completed appt.   Patient referred to apply for the following financial assistance Medicaid;[place] Card/Care Connects   Food insecurities addressed Not Applicable   Transportation assistance No   Assistance securing medications No   Educational health offerings Navigating the healthcare system;Nutrition   Encounter Details   Primary purpose of visit Education/Health Concerns;Acute Illness/Condition Visit;Navigating the Healthcare System;Spiritual Care/Support Visit   Was an Emergency Department visit averted? Not Applicable   Does patient have a medical provider? Yes   Patient referred to Follow up with established PCP   Was a mental health  screening completed? (GAINS tool) No   Does patient have dental issues? Yes   Was a dental referral made? Yes   Does patient have vision issues? No   Was a vision referral made? No   Does your patient have an abnormal blood pressure today? No   Since previous encounter, have you referred patient for abnormal blood pressure that resulted in a new diagnosis or medication change? No   Does your patient have an abnormal blood glucose today? No   Since previous encounter, have you referred patient for abnormal blood glucose that resulted in a new diagnosis or medication change? No   Was there a life-saving intervention made? No   For Abstraction Use Only   Was the patient insured? No    Client in today  To  let nurse know she has been seen by dentist again and she has a partial !!!  Dentist is working with her on payments. Client was very happy  And now she can enjoy eating and maybe gain weight. Thankful for referral

## 2016-05-01 ENCOUNTER — Telehealth: Payer: Self-pay | Admitting: Gastroenterology

## 2016-05-01 ENCOUNTER — Ambulatory Visit: Payer: Self-pay | Admitting: Gastroenterology

## 2016-05-01 NOTE — Telephone Encounter (Signed)
No charge. 

## 2016-05-20 DIAGNOSIS — Z8669 Personal history of other diseases of the nervous system and sense organs: Secondary | ICD-10-CM

## 2016-05-20 DIAGNOSIS — K0889 Other specified disorders of teeth and supporting structures: Secondary | ICD-10-CM

## 2016-05-21 NOTE — Congregational Nurse Program (Signed)
Congregational Nurse Program Note  Date of Encounter: 05/20/2016  Past Medical History: Past Medical History  Diagnosis Date  . ANEMIA, B12 DEFICIENCY 04/05/2009    macrocytic  . TOBACCO USE 01/25/2008  . 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 HYPERTENSION 01/29/2008  . EXCESSIVE MENSTRUAL BLEEDING 03/18/2010    uterine fibroids. gyn eval and bx neg  . PERIMENOPAUSAL SYNDROME 08/23/2009  . LEG PAIN, BILATERAL 06/22/2008  . SYNCOPE 01/02/2009  . Palpitations 01/02/2009  . THYROID FUNCTION TEST, ABNORMAL 03/19/2009  . Acute pancreatitis 12/05/2010, 01/2016    with complex pseudocyst, attributed to ETOH.  2017: pseudocyst, acute on chronic pancreatitis. pancreatic calcifications.  . WEIGHT LOSS-ABNORMAL 12/05/2010  . RASH 12/23/2010  . Hematemesis 01/2016    in setting pancreatitispseudocyst.    Encounter Details:     CNP Questionnaire - 05/21/16 1722    Patient Demographics   Is this a new or existing patient? Existing   Patient is considered a/an Not Applicable   Race African-American/Black   Patient Assistance   Location of Patient Assistance Not Applicable   Patient's financial/insurance status Self-Pay   Uninsured Patient Yes   Interventions Follow-up/Education/Support provided after completed appt.;Appt. has been completed   Patient referred to apply for the following financial assistance [employer] Card/Care Connects;[employer] Card/Care [employer] Renewal   Food insecurities addressed Not Applicable   Transportation assistance No   Assistance securing medications No   Educational Programmer, systemshealth offerings Navigating the healthcare system   Encounter Details   Primary purpose of visit Navigating the Healthcare System;Spiritual Care/Support Visit;Education/Health Concerns   Was an Emergency Department visit averted? Not Applicable   Does patient have a medical provider? Yes   Patient referred to Follow up with established PCP   Was a mental health screening  completed? (GAINS tool) No   Does patient have dental issues? No   Does patient have vision issues? Yes   Was a vision referral made? Yes   Does your patient have an abnormal blood pressure today? No   Since previous encounter, have you referred patient for abnormal blood pressure that resulted in a new diagnosis or medication change? No   Does your patient have an abnormal blood glucose today? No   Since previous encounter, have you referred patient for abnormal blood glucose that resulted in a new diagnosis or medication change? No   Was there a life-saving intervention made? No    Client referred to nurse to assist with  Eye  Appointment referral.  Client has been scheduled  For  Eye appointment 05-27-16 with eye  MD , needs her glasses  She  States  Can hardly  See and needs it  For her job. Client was seen by [employer]  But  NP has made an official referral  For client to  Be seen by  Eye  MD and client agreed as she is a [employer] orange  Card recipient , working  And agreed to  Pay  $60.00 co pay  In order to be seen quickly as DSS will not  Be able to assist  Client until August . Client wanted to know  If  There were any  Other resources to help with  Co pay .  Nurse  Reviewed all options  And client has saved  Some money but wanted to use it for housing and other needs . Encouraged to keep speciality appointment . Nurse did call to  seeif  Client  could  Avoid  Paying  Co  Pay  But  That is the  Only  Way  She will be seen.  Client will keep appointment.

## 2016-06-11 DIAGNOSIS — K859 Acute pancreatitis without necrosis or infection, unspecified: Secondary | ICD-10-CM

## 2016-06-11 DIAGNOSIS — K0889 Other specified disorders of teeth and supporting structures: Secondary | ICD-10-CM

## 2016-06-11 DIAGNOSIS — R42 Dizziness and giddiness: Secondary | ICD-10-CM

## 2016-06-11 LAB — GLUCOSE, POCT (MANUAL RESULT ENTRY): POC Glucose: 92 mg/dl (ref 70–99)

## 2016-06-11 NOTE — Congregational Nurse Program (Signed)
Congregational Nurse Program Note  Date of Encounter: 06/11/2016  Past Medical History: Past Medical History  Diagnosis Date  . ANEMIA, B12 DEFICIENCY 04/05/2009    macrocytic  . TOBACCO USE 01/25/2008  . 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 HYPERTENSION 01/29/2008  . EXCESSIVE MENSTRUAL BLEEDING 03/18/2010    uterine fibroids. gyn eval and bx neg  . PERIMENOPAUSAL SYNDROME 08/23/2009  . LEG PAIN, BILATERAL 06/22/2008  . SYNCOPE 01/02/2009  . Palpitations 01/02/2009  . THYROID FUNCTION TEST, ABNORMAL 03/19/2009  . Acute pancreatitis 12/05/2010, 01/2016    with complex pseudocyst, attributed to ETOH.  2017: pseudocyst, acute on chronic pancreatitis. pancreatic calcifications.  . WEIGHT LOSS-ABNORMAL 12/05/2010  . RASH 12/23/2010  . Hematemesis 01/2016    in setting pancreatitispseudocyst.    Encounter Details:     CNP Questionnaire - 06/11/16 2311    Patient Demographics   Is this a new or existing patient? Existing   Patient is considered a/an Not Applicable   Race African-American/Black   Patient Assistance   Location of Patient Assistance Not Applicable   Patient's financial/insurance status Self-Pay   Uninsured Patient Yes   Interventions Follow-up/Education/Support provided after completed appt.;Counseled to make appt. with provider   Patient referred to apply for the following financial assistance Not Applicable   Food insecurities addressed Not Applicable   Transportation assistance No   Assistance securing medications No   Educational health offerings Medications;Nutrition;Hypertension;Cardiac disease   Encounter Details   Primary purpose of visit Education/Health Concerns;Spiritual Care/Support Visit   Was an Emergency Department visit averted? Yes   Does patient have a medical provider? Yes   Patient referred to Follow up with established PCP   Was a mental health screening completed? (GAINS tool) No   Does patient have dental issues? No    Does patient have vision issues? No   Does your patient have an abnormal blood pressure today? No   Since previous encounter, have you referred patient for abnormal blood pressure that resulted in a new diagnosis or medication change? No   Does your patient have an abnormal blood glucose today? No   Since previous encounter, have you referred patient for abnormal blood glucose that resulted in a new diagnosis or medication change? No   Was there a life-saving intervention made? No    Client in to see nurse brought in by  Case manager stating she felt dizzy ,?if her blood  Sugar was low , nurse immediately  Checked  Her blood sugar and it was 92 mg. Client had eaten dinner 1 hour  Earlier stated she  Ate a fairly  Good amount. Blood pressure was 107/71 , Pulse 79. Counseled  Regarding  Findings and  Normal values. Questioned client  Regarding recent visit to see PCP and she stated it was Monday  , NP  Reviewed her lab work all was normal. Client has a history of pancreatitis and states  She  Has not  Had any alcohol, smokes  About 1 pack every 2-3 days , again counseled  Regarding the  Amount of smoking she does. Client became a little upset stating  There is something  Wrong , I feel bad , no chest  Pain or radiating pain , no nausea, ,no fever on the outside , she states her stomach feels funny??.  Nurse listened to  Concerns offered support , gave client signs and symptoms of  disorders of the pancreas and again reviewed or  questioned client about  Various conditions as anemia  Client states no problems . Nurse urged client if  She  Feels strongly something else is  Going  On to  Discuss it with her PCP. She has lost some weight and we talked about tht in relation to her pancreas disorder. Nurse allowed client to sit  For a while  Until she was able to leave without assistance .  Ask security mo nitors to  Observe client while  Outside  And notify nurse if needed. Check on client next week.

## 2016-06-18 ENCOUNTER — Telehealth: Payer: Self-pay

## 2016-06-18 DIAGNOSIS — R42 Dizziness and giddiness: Secondary | ICD-10-CM

## 2016-06-18 DIAGNOSIS — K859 Acute pancreatitis without necrosis or infection, unspecified: Secondary | ICD-10-CM

## 2016-06-18 NOTE — Telephone Encounter (Signed)
Nurse called  NP  To  discuss clients  Recent two episodes of dizziness and her frustrations with  Not  Knowing what is  Causing her problems . After talking with  NP  We agreed that blood sugars may  Be  Dropping as client probably isn't eating well due to  Pain caused by pancreas being inflamed. Client has lost weight. Recommendations ensure 1 can every day ,if she cant get calories by eating drink them. NP  To call in order ,nurse will counsel client . Weekly weights. client is stressed and worried about  Possibilities of cancer . Client has told  Both  Providers she isn't drinking any thing.  Nurse will follow  weekly

## 2016-06-18 NOTE — Congregational Nurse Program (Signed)
Congregational Nurse Program Note  Date of Encounter: 06/18/2016  Past Medical History: Past Medical History  Diagnosis Date  . ANEMIA, B12 DEFICIENCY 04/05/2009    macrocytic  . TOBACCO USE 01/25/2008  . 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 HYPERTENSION 01/29/2008  . EXCESSIVE MENSTRUAL BLEEDING 03/18/2010    uterine fibroids. gyn eval and bx neg  . PERIMENOPAUSAL SYNDROME 08/23/2009  . LEG PAIN, BILATERAL 06/22/2008  . SYNCOPE 01/02/2009  . Palpitations 01/02/2009  . THYROID FUNCTION TEST, ABNORMAL 03/19/2009  . Acute pancreatitis 12/05/2010, 01/2016    with complex pseudocyst, attributed to ETOH.  2017: pseudocyst, acute on chronic pancreatitis. pancreatic calcifications.  . WEIGHT LOSS-ABNORMAL 12/05/2010  . RASH 12/23/2010  . Hematemesis 01/2016    in setting pancreatitispseudocyst.    Encounter Details:     CNP Questionnaire - 06/18/16 1907    Encounter Details   Primary purpose of visit Education/Health Concerns;Spiritual Care/Support Visit;Chronic Illness/Condition Visit   Was an Emergency Department visit averted? Not Applicable   Does patient have a medical provider? Yes   Patient referred to Follow up with established PCP   Was a mental health screening completed? (GAINS tool) No   Does patient have dental issues? Yes   Was a dental referral made? No   Does patient have vision issues? Yes   Was a vision referral made? No   Does your patient have an abnormal blood pressure today? No   Since previous encounter, have you referred patient for abnormal blood pressure that resulted in a new diagnosis or medication change? No   Does your patient have an abnormal blood glucose today? No   Since previous encounter, have you referred patient for abnormal blood glucose that resulted in a new diagnosis or medication change? No   Was there a life-saving intervention made? No    Client was sitting in lobby  Nurse approached and ask her how  She  Was  feeling  , states I had  Another episode  Today . Feeling  Weak  Dizzy. Becoming  Frustrated needs to  Know  What is  Going on . Nurse counseled regarding her nutrition and encouraged to  Take  Ensure as suggested before, she has obtained  Some , seems worried . States she  Eats ,but  Maybe not  Enough. Client has not  Been eating  Well, Doesn't want to admit it. Will try  Ensure  And see how that goes. Nurse will call  Provider  NP  Chales AbrahamsMary Ann to discuss  Client.  After talking with  Provider  She  Feels that clients  Blood  Sugars  Are dropping , recommends if  You can't eat calories then drink calories . Will order  Ensure for  Client  And may  Have to treat her pancreas due to elevations in blood  Work. [place]  Weekly, 06-09-16  Weight 94  Today 95  Past  Dinner. Client to  Pick up  Ensure. Follow  weekly

## 2016-06-25 DIAGNOSIS — R109 Unspecified abdominal pain: Secondary | ICD-10-CM

## 2016-06-25 DIAGNOSIS — R42 Dizziness and giddiness: Secondary | ICD-10-CM

## 2016-06-25 DIAGNOSIS — K859 Acute pancreatitis without necrosis or infection, unspecified: Secondary | ICD-10-CM

## 2016-06-25 NOTE — Congregational Nurse Program (Signed)
Congregational Nurse Program Note  Date of Encounter: 06/25/2016  Past Medical History: Past Medical History:  Diagnosis Date  . Acute pancreatitis 12/05/2010, 01/2016   with complex pseudocyst, attributed to ETOH.  2017: pseudocyst, acute on chronic pancreatitis. pancreatic calcifications.  . ANEMIA, B12 DEFICIENCY 04/05/2009   macrocytic  . EXCESSIVE MENSTRUAL BLEEDING 03/18/2010   uterine fibroids. gyn eval and bx neg  . Hematemesis 01/2016   in setting pancreatitispseudocyst.  . HYPERTENSION 01/29/2008  . LEG PAIN, BILATERAL 06/22/2008  . Palpitations 01/02/2009  . PERIMENOPAUSAL SYNDROME 08/23/2009  . RASH 12/23/2010  . 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 SYNCOPE 01/02/2009  . THYROID FUNCTION TEST, ABNORMAL 03/19/2009  . TOBACCO USE 01/25/2008  . WEIGHT LOSS-ABNORMAL 12/05/2010    Encounter Details:     CNP Questionnaire - 06/25/16 1903      Patient Demographics   Is this a new or existing patient? Existing   Patient is considered a/an Not Applicable   Race African-American/Black     Patient Assistance   Location of Patient Assistance Not Applicable   Patient's financial/insurance status Self-Pay;[facility]/Care Connects   Uninsured Patient Yes   Interventions Follow-up/Education/Support provided after completed appt.;Counseled to make appt. with provider   Patient referred to apply for the following financial assistance Not Applicable   Food insecurities addressed Not Applicable   Transportation assistance No   Assistance securing medications No   Educational health offerings Chronic disease;Nutrition     Encounter Details   Primary purpose of visit Education/Health Concerns;Spiritual Care/Support Visit   Was an Emergency Department visit averted? Not Applicable   Does patient have a medical provider? Yes   Patient referred to Follow up with established PCP   Was a mental health screening completed? (GAINS tool) No   Does patient have  dental issues? No   Does patient have vision issues? Yes   Was a vision referral made? No   Does your patient have an abnormal blood pressure today? No   Since previous encounter, have you referred patient for abnormal blood pressure that resulted in a new diagnosis or medication change? No   Does your patient have an abnormal blood glucose today? No   Was there a life-saving intervention made? No     Client in for weekly weight , 95 lbs. States she  Just  Ate dinner  Feeling  bloated  And  Uncomfortable. States when she  Eats a large meal  This  Occurs ,when she  Eats small amounts  It  Doesn't occur. Client  Needs to  Eat but  Is faced with being uncomfortable if she eats the dinner meal. Has drunk one  Ensure since last week , encouraged to  Drink one  Per day .  Wants to  Be evaluated by  Gastroenterologist. Needs to see NP  At  [facility] regarding a referral.  Nurse will  Speak with provider to see what  Can be  Done. Follow  Weekly weight

## 2016-07-02 ENCOUNTER — Telehealth: Payer: Self-pay

## 2016-07-02 DIAGNOSIS — R109 Unspecified abdominal pain: Secondary | ICD-10-CM

## 2016-07-02 DIAGNOSIS — K859 Acute pancreatitis without necrosis or infection, unspecified: Secondary | ICD-10-CM

## 2016-07-02 DIAGNOSIS — R42 Dizziness and giddiness: Secondary | ICD-10-CM

## 2016-07-02 NOTE — Telephone Encounter (Signed)
Called tpo check on medication order for stomach pain , left message.

## 2016-07-02 NOTE — Congregational Nurse Program (Signed)
Congregational Nurse Program Note  Date of Encounter: 07/02/2016  Past Medical History: Past Medical History:  Diagnosis Date  . Acute pancreatitis 12/05/2010, 01/2016   with complex pseudocyst, attributed to ETOH.  2017: pseudocyst, acute on chronic pancreatitis. pancreatic calcifications.  . ANEMIA, B12 DEFICIENCY 04/05/2009   macrocytic  . EXCESSIVE MENSTRUAL BLEEDING 03/18/2010   uterine fibroids. gyn eval and bx neg  . Hematemesis 01/2016   in setting pancreatitispseudocyst.  . HYPERTENSION 01/29/2008  . LEG PAIN, BILATERAL 06/22/2008  . Palpitations 01/02/2009  . PERIMENOPAUSAL SYNDROME 08/23/2009  . RASH 12/23/2010  . 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 SYNCOPE 01/02/2009  . THYROID FUNCTION TEST, ABNORMAL 03/19/2009  . TOBACCO USE 01/25/2008  . WEIGHT LOSS-ABNORMAL 12/05/2010    Encounter Details:     CNP Questionnaire - 07/02/16 1934      Patient Demographics   Is this a new or existing patient? Existing   Patient is considered a/an Not Applicable   Race African-American/Black     Patient Assistance   Location of Patient Assistance Not Applicable   Patient's financial/insurance status [facility] [employer]/Care Connects   Uninsured Patient Yes   Interventions Follow-up/Education/Support provided after completed appt.   Patient referred to apply for the following financial assistance Not Applicable   Food insecurities addressed Not Applicable   Transportation assistance No   Assistance securing medications No   Educational health offerings Chronic disease;Medications;Nutrition     Encounter Details   Primary purpose of visit Chronic Illness/Condition Visit;Education/Health Concerns;Spiritual Care/Support Visit   Was an Emergency Department visit averted? Not Applicable   Does patient have a medical provider? Yes   Patient referred to Follow up with established PCP   Was a mental health screening completed? (GAINS tool) No   Does patient have  dental issues? No   Was a dental referral made? No   Does patient have vision issues? Yes   Was a vision referral made? No   Does your patient have an abnormal blood pressure today? No   Since previous encounter, have you referred patient for abnormal blood pressure that resulted in a new diagnosis or medication change? No   Does your patient have an abnormal blood glucose today? No   Since previous encounter, have you referred patient for abnormal blood glucose that resulted in a new diagnosis or medication change? No   Was there a life-saving intervention made? No     Client in for weekly  Weight check  ,no  Change ,client hoping she had gained some weight. Nurse was supportive  And tried to get  Client to  Look at positives  No  Weight loss. States she is trying to increase liquids  Calories . We reviewed her intake and several suggestions were made. Nurse reviewed the anatomy of  Pancreas and reviewed some symptoms of her condition with her. Client seemed to understand more but  Wants to feel better. Informed client that nurse had called  NP  To  Get  Clarification on medication she is to order for her stomach. Will follow  Up  When NP returns call.  Follow  Weekly

## 2016-07-09 ENCOUNTER — Telehealth: Payer: Self-pay

## 2016-07-09 NOTE — Telephone Encounter (Signed)
[facility]  [facility]  In medication for  Stomach pain . Will alert client

## 2016-07-09 NOTE — Telephone Encounter (Signed)
TC to  Case manager to  Ask  Client to  Call for  Prescriptions at  Health  Dept  To  Be filled . Will  Get message to client.

## 2016-07-16 DIAGNOSIS — R109 Unspecified abdominal pain: Secondary | ICD-10-CM

## 2016-07-16 DIAGNOSIS — R42 Dizziness and giddiness: Secondary | ICD-10-CM

## 2016-07-16 DIAGNOSIS — K859 Acute pancreatitis without necrosis or infection, unspecified: Secondary | ICD-10-CM

## 2016-07-16 NOTE — Congregational Nurse Program (Signed)
Congregational Nurse Program Note  Date of Encounter: 07/16/2016  Past Medical History: Past Medical History:  Diagnosis Date  . Acute pancreatitis 12/05/2010, 01/2016   with complex pseudocyst, attributed to ETOH.  2017: pseudocyst, acute on chronic pancreatitis. pancreatic calcifications.  . ANEMIA, B12 DEFICIENCY 04/05/2009   macrocytic  . EXCESSIVE MENSTRUAL BLEEDING 03/18/2010   uterine fibroids. gyn eval and bx neg  . Hematemesis 01/2016   in setting pancreatitispseudocyst.  . HYPERTENSION 01/29/2008  . LEG PAIN, BILATERAL 06/22/2008  . Palpitations 01/02/2009  . PERIMENOPAUSAL SYNDROME 08/23/2009  . RASH 12/23/2010  . 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 SYNCOPE 01/02/2009  . THYROID FUNCTION TEST, ABNORMAL 03/19/2009  . TOBACCO USE 01/25/2008  . WEIGHT LOSS-ABNORMAL 12/05/2010    Encounter Details:     CNP Questionnaire - 07/16/16 2255      Patient Demographics   Is this a new or existing patient? Existing   Patient is considered a/an Not Applicable   Race African-American/Black     Patient Assistance   Location of Patient Assistance Not Applicable   Patient's financial/insurance status Self-Pay;[facility] Card/Care Connects   Uninsured Patient Yes   Interventions Follow-up/Education/Support provided after completed appt.;Counseled to make appt. with provider   Patient referred to apply for the following financial assistance [employer]   Food insecurities addressed Not Applicable   Transportation assistance No   Assistance securing medications No   Educational health offerings Chronic disease;Nutrition;Medications     Encounter Details   Primary purpose of visit Education/Health Concerns;Chronic Illness/Condition Visit;Spiritual Care/Support Visit   Was an Emergency Department visit averted? Not Applicable   Does patient have a medical provider? Yes   Patient referred to [facility] Agency;Follow up with established PCP   Was a mental  health screening completed? (GAINS tool) No   Does patient have dental issues? No   Was a dental referral made? No   Does patient have vision issues? Yes   Was a vision referral made? No   Does your patient have an abnormal blood pressure today? No   Since previous encounter, have you referred patient for abnormal blood pressure that resulted in a new diagnosis or medication change? No   Does your patient have an abnormal blood glucose today? No   Since previous encounter, have you referred patient for abnormal blood glucose that resulted in a new diagnosis or medication change? No   Was there a life-saving intervention made? No    In for weight check ,client upset no weight gain,encouraged that she hasn't lost weight. Supportive  But  Client failed to call or pick up medication for stomach pains. Has afollow up appointment tomorrow and encouraged to  Talk with  NP  About her stomach pains ,low  Blood  Pressure and medications. Given recent weights  And blood pressure to share with NP. Client feels better this week ,cold better ,but still has a cough ,congestion. Coughs at night a lot she states. Nurse counseled regarding  Nasal drip.

## 2016-07-23 NOTE — Congregational Nurse Program (Signed)
Congregational Nurse Program Note  Date of Encounter: 07/02/2016  Past Medical History: Past Medical History:  Diagnosis Date  . Acute pancreatitis 12/05/2010, 01/2016   with complex pseudocyst, attributed to ETOH.  2017: pseudocyst, acute on chronic pancreatitis. pancreatic calcifications.  . ANEMIA, B12 DEFICIENCY 04/05/2009   macrocytic  . EXCESSIVE MENSTRUAL BLEEDING 03/18/2010   uterine fibroids. gyn eval and bx neg  . Hematemesis 01/2016   in setting pancreatitispseudocyst.  . HYPERTENSION 01/29/2008  . LEG PAIN, BILATERAL 06/22/2008  . Palpitations 01/02/2009  . PERIMENOPAUSAL SYNDROME 08/23/2009  . RASH 12/23/2010  . 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 SYNCOPE 01/02/2009  . THYROID FUNCTION TEST, ABNORMAL 03/19/2009  . TOBACCO USE 01/25/2008  . WEIGHT LOSS-ABNORMAL 12/05/2010    Encounter Details:     CNP Questionnaire - 07/16/16 2255      Patient Demographics   Is this a new or existing patient? Existing   Patient is considered a/an Not Applicable   Race African-American/Black     Patient Assistance   Location of Patient Assistance Not Applicable   Patient's financial/insurance status Self-Pay;[facility] [employer]/Care Connects   Uninsured Patient Yes   Interventions Follow-up/Education/Support provided after completed appt.;Counseled to make appt. with provider   Patient referred to apply for the following financial assistance [employer]   Food insecurities addressed Not Applicable   Transportation assistance No   Assistance securing medications No   Educational health offerings Chronic disease;Nutrition;Medications     Encounter Details   Primary purpose of visit Education/Health Concerns;Chronic Illness/Condition Visit;Spiritual Care/Support Visit   Was an Emergency Department visit averted? Not Applicable   Does patient have a medical provider? Yes   Patient referred to Area Agency;Follow up with established PCP   Was a mental  health screening completed? (GAINS tool) No   Does patient have dental issues? No   Was a dental referral made? No   Does patient have vision issues? Yes   Was a vision referral made? No   Does your patient have an abnormal blood pressure today? No   Since previous encounter, have you referred patient for abnormal blood pressure that resulted in a new diagnosis or medication change? No   Does your patient have an abnormal blood glucose today? No   Since previous encounter, have you referred patient for abnormal blood glucose that resulted in a new diagnosis or medication change? No   Was there a life-saving intervention made? No     Client was seen 07-02-16  For weight checks  And  Nutritional   and wgt   Counseling.  Follow  weekly

## 2017-05-19 ENCOUNTER — Ambulatory Visit (INDEPENDENT_AMBULATORY_CARE_PROVIDER_SITE_OTHER): Payer: Self-pay | Admitting: Internal Medicine

## 2017-05-19 ENCOUNTER — Encounter: Payer: Self-pay | Admitting: Internal Medicine

## 2017-05-19 VITALS — BP 118/70 | HR 122 | Resp 12 | Ht <= 58 in | Wt 91.0 lb

## 2017-05-19 DIAGNOSIS — R0789 Other chest pain: Secondary | ICD-10-CM

## 2017-05-19 DIAGNOSIS — R946 Abnormal results of thyroid function studies: Secondary | ICD-10-CM

## 2017-05-19 DIAGNOSIS — R079 Chest pain, unspecified: Secondary | ICD-10-CM

## 2017-05-19 DIAGNOSIS — K219 Gastro-esophageal reflux disease without esophagitis: Secondary | ICD-10-CM

## 2017-05-19 MED ORDER — FAMOTIDINE 20 MG PO TABS: ORAL_TABLET | ORAL | 4 refills | Status: DC

## 2017-05-19 NOTE — Patient Instructions (Signed)
For reflux:  Stop tobacco, alcohol, onions, tomatoes, chocolate, caffeine (sodas, coffee, tea), antiinflammatories.

## 2017-05-19 NOTE — Progress Notes (Signed)
Subjective:    Patient ID: Nichole [name], female    DOB: 1961-10-09, 56 y.o.   MRN: [id number]  HPI   Here to establish  1.  Chest Discomfort:  First noted in March.  Was sitting watching TV after dinner when first occurred and seemed to hurt when she took a breath.  Points to the length of left sternal border--felt like she couldn't get air in.  Later, states there wasn't really any pain, just couldn't get air in with a deep breath.  With regular breathing, felt like a pressure in along the left sternal area up to sternal notch.  Perhaps a tightness that gives her pain. Patient has significant difficulty describing the discomfort.  States in March, had constant discomfort for 3 weeks.   Went away on its own without any treatment. Started up again this past Saturday, 4 days ago, soon after awakening, noted again. 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  Does not recall doing anything differently before these episodes at work or at home.  No children or grandchildren she could be lifting up.   Could not sleep 3 nights ago as it hurt too much to lie on either side. She also notes the pain more significantly when bending over to put shoes on, etc. Has history of GERD with the acid in her throat, this is different. Has been eating a lot of salsa--onions and tomatoes.  Eats a fair amount of chocolate.  Smokes as noted earlier.  Has not been drinking much in way of alcohol in past month, save for her birthday, last Friday.   Did note the discomfort was making her cough. Had one episode 20 years ago where needed to take breathing treatments, but none before or since.   May have allergies --has sneezing and sinus congestion and runny nose with season changes, but no particular season bothers her more.  Has also noted cut grass causes her to sneeze. Has not take any medication for allergies or for the discomfort.  Has a history of increased heart rate and elevated  blood pressure.  Has taken 1/4 tab of Metoprolol 25 mg up to twice daily.  Only taking if her heart starts beating fast.   Has taken 1/4 tab maybe 3 times since January.    Current Meds  Medication Sig  . metoprolol tartrate (LOPRESSOR) 25 MG tablet Take 25 mg by mouth. 1/4 of a tablet daily    Allergies  Allergen Reactions  . Lisinopril     REACTION: ? facial angioedema   Past Medical History:  Diagnosis Date  . Acute pancreatitis 12/05/2010, 01/2016   with complex pseudocyst, attributed to ETOH.  2017: pseudocyst, acute on chronic pancreatitis. pancreatic calcifications.  . ANEMIA, B12 DEFICIENCY 04/05/2009   macrocytic  . EXCESSIVE MENSTRUAL BLEEDING 03/18/2010   uterine fibroids. gyn eval and bx neg  . Hematemesis 01/2016   in setting pancreatitispseudocyst.  . HYPERTENSION 01/29/2008  . LEG PAIN, BILATERAL 06/22/2008  . Palpitations 01/02/2009   States heart rate has been up to 180, though states has never been seen when having fast heart rate.  Marland Kitchen PERIMENOPAUSAL SYNDROME 08/23/2009  . RASH 12/23/2010  . 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 SYNCOPE 01/02/2009  . THYROID FUNCTION TEST, ABNORMAL 03/19/2009  . TOBACCO USE 01/25/2008  . WEIGHT LOSS-ABNORMAL 12/05/2010   Past Surgical History:  Procedure Laterality Date  . ESOPHAGOGASTRODUODENOSCOPY (EGD) WITH  PROPOFOL N/A 02/28/2016   Procedure: ESOPHAGOGASTRODUODENOSCOPY (EGD) WITH PROPOFOL;  Surgeon: Nichole FormKavitha Nandigam V, MD;  Location: [facility];  Service: Endoscopy;  Laterality: N/A;  . KNEE SURGERY Left 1998   Arthroscopic--for torn meniscus   Family History  Problem Relation Age of Onset  . Hypertension Mother   . Kidney disease Mother        removed for cyst--not clear if this was a malignancy or not  . Other Mother        overweight--lost a lot following diagnosis of diabetes  . Diabetes Mother   . Heart attack Father   . Stroke Father        died age 768 yo--had multiple TIAs  .  Muscular dystrophy Father   . Hypertension Brother   . Neuromuscular disorder Unknown        spinomuscular atrophy    Social History   Social History  . Marital status: Single    Spouse name: N/A  . Number of children: 0  . Years of education: GED   Occupational History  . cashier/stocker/floor associate [employer]   Social History Main Topics  . Smoking status: Current Every Day Smoker    Packs/day: 0.50    Years: 36.00    Types: Cigarettes  . Smokeless tobacco: Never Used  . Alcohol use Yes     Comment: Began drinking a lot in mid 30s.  [facility] [employer].  Has depression and anxiety.  Leonor LivAnya Cuebas-Colon, MSW, LCSWA  . Drug use: No  . Sexual activity: Not on file   Other Topics Concern  . Not on file   Social History Narrative   Has lived in [place], in [place] years.   Originally [place]                 Review of Systems     Objective:   Physical Exam ECG:  Low voltage in limb leads.  Otherwise really without change.  No ischemic changes.  HEENT:  PERRL, EOMI, TMs pearly gray, throat without injection.   Neck:  Supple, No adenopathy, No thyromegaly Chest:  CTA.  Tender over left anterior chest wall with compression of rib cage from bilateral lateral aspect and with direct compression over the left anterior chest.  This reproduces some of the chest discomfort. CV:  RRR, slightly tachycardic, Normal S1 and S2, No S3, S4 or murmur.  No carotid bruits, Carotid, radial and DP pulses normal and equal Abd:  S, very tender over midepigastrium--compression here causes discomfort in left chest and up into throat, + BS, No HSM or mass. LE:  No edema.        Assessment & Plan:  1.  Chest Pain:  Multifactorial:   Chest wall and likely GERD.  CBC, CMP.  ECG unchanged and normal. Patient does not like to take meds and with concern for GERD/gastritis, do not want to utilize NSAIDS. Famotidine 40 mg at bedtime--encouraged  her to consider using this for next 2 months. GERD dietary restrictions discussed, especially staying away from [employer].  No lying down for 2 hours after eating. Follow up in 2 months.  2.  History of abnormal thyroid testing:  TSH

## 2017-05-20 LAB — CBC WITH DIFFERENTIAL/PLATELET
BASOS ABS: 0 10*3/uL (ref 0.0–0.2)
Basos: 1 %
EOS (ABSOLUTE): 0.1 10*3/uL (ref 0.0–0.4)
Eos: 3 %
Hematocrit: 43.4 % (ref 34.0–46.6)
Hemoglobin: 14.5 g/dL (ref 11.1–15.9)
IMMATURE GRANS (ABS): 0 10*3/uL (ref 0.0–0.1)
IMMATURE GRANULOCYTES: 1 %
LYMPHS: 28 %
Lymphocytes Absolute: 1.1 10*3/uL (ref 0.7–3.1)
MCH: 34 pg — ABNORMAL HIGH (ref 26.6–33.0)
MCHC: 33.4 g/dL (ref 31.5–35.7)
MCV: 102 fL — ABNORMAL HIGH (ref 79–97)
Monocytes Absolute: 0.3 10*3/uL (ref 0.1–0.9)
Monocytes: 8 %
NEUTROS PCT: 59 %
Neutrophils Absolute: 2.3 10*3/uL (ref 1.4–7.0)
PLATELETS: 210 10*3/uL (ref 150–379)
RBC: 4.26 x10E6/uL (ref 3.77–5.28)
RDW: 14.8 % (ref 12.3–15.4)
WBC: 3.8 10*3/uL (ref 3.4–10.8)

## 2017-05-20 LAB — COMPREHENSIVE METABOLIC PANEL
ALT: 11 IU/L (ref 0–32)
AST: 23 IU/L (ref 0–40)
Albumin/Globulin Ratio: 1.2 (ref 1.2–2.2)
Albumin: 4.1 g/dL (ref 3.5–5.5)
Alkaline Phosphatase: 118 IU/L — ABNORMAL HIGH (ref 39–117)
BILIRUBIN TOTAL: 0.3 mg/dL (ref 0.0–1.2)
BUN/Creatinine Ratio: 18 (ref 9–23)
BUN: 13 mg/dL (ref 6–24)
CALCIUM: 9.7 mg/dL (ref 8.7–10.2)
CHLORIDE: 100 mmol/L (ref 96–106)
CO2: 23 mmol/L (ref 20–29)
Creatinine, Ser: 0.74 mg/dL (ref 0.57–1.00)
GFR, EST AFRICAN AMERICAN: 106 mL/min/{1.73_m2} (ref >59)
GFR, EST NON AFRICAN AMERICAN: 92 mL/min/{1.73_m2} (ref >59)
GLUCOSE: 95 mg/dL (ref 65–99)
Globulin, Total: 3.4 g/dL (ref 1.5–4.5)
Potassium: 4.3 mmol/L (ref 3.5–5.2)
Sodium: 138 mmol/L (ref 134–144)
TOTAL PROTEIN: 7.5 g/dL (ref 6.0–8.5)

## 2017-05-20 LAB — TSH: TSH: 0.923 u[IU]/mL (ref 0.450–4.500)

## 2017-05-20 NOTE — Addendum Note (Signed)
Addended by: Marcene DuosMULBERRY, Hulda Reddix M on: 05/20/2017 01:45 PM   Modules accepted: Orders

## 2017-05-25 ENCOUNTER — Ambulatory Visit: Payer: Self-pay | Admitting: Internal Medicine

## 2017-06-18 LAB — VITAMIN B12: VITAMIN B 12: 369 pg/mL (ref 232–1245)

## 2017-06-18 LAB — SPECIMEN STATUS REPORT

## 2017-07-17 ENCOUNTER — Ambulatory Visit (INDEPENDENT_AMBULATORY_CARE_PROVIDER_SITE_OTHER): Payer: Self-pay | Admitting: Internal Medicine

## 2017-07-17 VITALS — BP 110/70 | HR 110 | Ht <= 58 in | Wt 90.5 lb

## 2017-07-17 DIAGNOSIS — F329 Major depressive disorder, single episode, unspecified: Secondary | ICD-10-CM

## 2017-07-17 DIAGNOSIS — K869 Disease of pancreas, unspecified: Secondary | ICD-10-CM

## 2017-07-17 DIAGNOSIS — R1013 Epigastric pain: Secondary | ICD-10-CM

## 2017-07-17 DIAGNOSIS — F419 Anxiety disorder, unspecified: Secondary | ICD-10-CM | POA: Insufficient documentation

## 2017-07-17 DIAGNOSIS — K861 Other chronic pancreatitis: Secondary | ICD-10-CM

## 2017-07-17 DIAGNOSIS — K8689 Other specified diseases of pancreas: Secondary | ICD-10-CM

## 2017-07-17 DIAGNOSIS — F1010 Alcohol abuse, uncomplicated: Secondary | ICD-10-CM

## 2017-07-17 DIAGNOSIS — K862 Cyst of pancreas: Secondary | ICD-10-CM

## 2017-07-17 MED ORDER — OMEPRAZOLE 20 MG PO CPDR: DELAYED_RELEASE_CAPSULE | ORAL | 4 refills | Status: DC

## 2017-07-17 MED ORDER — MIRTAZAPINE 15 MG PO TABS: 15.0000 mg | ORAL_TABLET | Freq: Every day | ORAL | 2 refills | Status: DC

## 2017-07-17 NOTE — Progress Notes (Signed)
   Subjective:    Patient ID: Nichole [name], female    DOB: December 11, 1960, 56 y.o.   MRN: [id number]  HPI   1.  GERD/Gastritis:  See weight loss below.  Took Famotidine for only 3-4 days and felt it made her feel worse--when she would lie down to sleep, she would have chest pain up to her throat and have difficulty breathing--felt like "air", but denies sense of need to belch.    2.  Sinus tachycardia:  States has had this since the mid 1990s after suffering Bell's Palsy.   Does feel she has issues regarding depression and anxiety.  Has counseling at [facility] of the [place]:  Anya Colon.    3.  Weight loss:  Does not eat much.  States eats maybe 900 calories daily.  Hurts all over if she doesn't eat, but once she starts to have pain, doesn't feel like eating.  Actually, sounds like pain often starts in abdomen.   Has history of pancreatitis with hospitalizations twice.  Felt secondary to alcohol abuse.  Still drinking twice weekly.  Is drinking 2 shots of gin twice weekly.   Does not recall ever being on pancreatic enzymes and do not see that from discharge summary in March of 2017. Patient also had masses in head of pancreas, felt likely to be resolving pseudocysts that was never followed up on as she did not have insurance.  Describes not being able to followup with GI to do so.   4.  Sleep difficulties:  Initiating and maintaining sleep are both issues.   No outpatient prescriptions have been marked as taking for the 07/17/17 encounter (Office Visit) with Julieanne Manson, MD.   Allergies  Allergen Reactions  . Lisinopril     REACTION: ? facial angioedema     Review of Systems     Objective:   Physical Exam  NAD HEENT:  Throat without injection.   Neck:  Supple, No adenopathy Chest:  CTA CV:  RRR without murmur or rub, radial and DP pulses normal and equal Abd:  S, Mild to moderate tenderness midepigastrium.  No rebound or peritoneal signs.  No HSM or mass, + BS LE:   No edema.      Assessment & Plan:  1.  Chronic Pancreatitis and history complex lesion/pseudocyst head of pancreas from 02/27/2016 CT that has not been reimaged:   Needs MRCP/MR of abdomen--call already into [facility] to request this soon B12, lipase   2.  Epigastric Pain:  ?GERD in addition to symptoms of chronic pancreatitis.  Will switch to PPI with Omeprazole. GERD precautions discussed.  3.  Depression/anxiety/weight loss/insomnia:  Remeron 15 mg at bedtime to hopefully improve all of these concerns.   Release of info for her counselor, Junie Panning, at [facility]. Talk with anya--ROI Followup in 1 week.  4.  Palpitations:  Prefers utilizing beta blockade only on as needed basis.  BP is fine today without taking Metoprolol.

## 2017-07-18 LAB — LIPASE: Lipase: 73 U/L — ABNORMAL HIGH (ref 14–72)

## 2017-07-18 LAB — VITAMIN B12: VITAMIN B 12: 372 pg/mL (ref 232–1245)

## 2017-07-22 ENCOUNTER — Telehealth: Payer: Self-pay

## 2017-07-22 NOTE — Telephone Encounter (Signed)
Per Dr. Delrae Alfred have patient hold remeron for today. States she educated patient on the fact that it can cause you to feel sluggish. Informed patient needs to go home for the day more than likely her symptoms are more viral then anything. Spoke with patient informed her of message from Dr. Delrae Alfred informed she needs at least 8 hours of sleep taking that medication and she needs to keep her follow up on Friday with Dr. Delrae Alfred. Patient verbalized understanding and states she is going home for the rest of the day.

## 2017-07-22 NOTE — Telephone Encounter (Signed)
Patient supervisor called stating patient was in his office sick. Patient informed supervisor that she started her remeron on last night. States she took it at 10:30 pm. Patient took her prilosec this morning 30 minutes before breakfast and had a egg sandwich. Since being at work she is now sweating, having diarrhea and vomiting. Patient states she felt sluggish when she got up this morning. Patient wants to know what she needs to do. To Dr. Delrae Alfred for further direction.

## 2017-07-24 ENCOUNTER — Ambulatory Visit (INDEPENDENT_AMBULATORY_CARE_PROVIDER_SITE_OTHER): Payer: Self-pay | Admitting: Internal Medicine

## 2017-07-24 ENCOUNTER — Encounter: Payer: Self-pay | Admitting: Internal Medicine

## 2017-07-24 VITALS — BP 118/78 | HR 94 | Resp 12 | Ht <= 58 in | Wt 91.0 lb

## 2017-07-24 DIAGNOSIS — F329 Major depressive disorder, single episode, unspecified: Secondary | ICD-10-CM

## 2017-07-24 DIAGNOSIS — K863 Pseudocyst of pancreas: Secondary | ICD-10-CM

## 2017-07-24 DIAGNOSIS — F419 Anxiety disorder, unspecified: Secondary | ICD-10-CM

## 2017-07-24 DIAGNOSIS — F32A Depression, unspecified: Secondary | ICD-10-CM

## 2017-07-24 DIAGNOSIS — D539 Nutritional anemia, unspecified: Secondary | ICD-10-CM

## 2017-07-24 DIAGNOSIS — R634 Abnormal weight loss: Secondary | ICD-10-CM

## 2017-07-24 MED ORDER — VITAMIN B-12 1000 MCG PO TABS: 1000.0000 ug | ORAL_TABLET | Freq: Every day | ORAL | Status: DC

## 2017-07-24 NOTE — Progress Notes (Signed)
   Subjective:    Patient ID: Nichole [name], female    DOB: 01-18-1961, 56 y.o.   MRN: [id number]  HPI   1.  Depression/anxiety:  Took Remeron Tuesday night around 10:30 p.m.and awakened the next morning "like a zombie."   In between, awakened around 1:30 a.m. With terrible nightmares and at one point was very frightened and actually thought about jumping out of a window. Today, she states she really does not have trouble with sleep, which is different information than last visit. Did not feel well at work the next morning and was instructed to go home and rest.  2.  Abdominal pain and early satiety/lack of appetite:  Not taking Omeprazole at this point.  Cannot get a clear history.  Apparently, feels this caused part of her problem at work on Wedesday.  Current Meds  Medication Sig  . metoprolol tartrate (LOPRESSOR) 25 MG tablet Take 25 mg by mouth. 1/4 of a tablet daily    Allergies  Allergen Reactions  . Lisinopril     REACTION: ? facial angioedema     Review of Systems     Objective:   Physical Exam  Alert, NAD. HEENT: PERRL, EOMI Neck:  Supple, No adenopathy Chest:  CTA CV:  RRR without murmur or rub.  Radial pulses normal and equal Abd;  Mid epigastric tenderness, mild, + BS, No HSM or mass. LE:  No edema        Assessment & Plan:  1.  Depression/Anxiety:  Holding on Remeron and other antidepressants/anxiolytics currently as feel patient is very anxious to try anything.  Will treat #2 first and go from there. Apparently took Metoprolol today as heart rate was fast.  Her heart rate is better and up 1 lb from a week ago.  2.  Abdominal pain/chronic pancreatitis/weight loss:   with mildly elevated Lipase:  Checking fecal fat -to get back to [place] by Monday.  Would also like her to get back on Omeprazole.  Not clear if she will continue regularly To eat 5 small meals daily and to make a brief schedule of meals each Saturday when she gets her work schedule. We went over  protein/fat/carbs for each meal MRCP order pending through [facility].  3.  Insomnia:  Now states no problem with sleep.  Only does not sleep when has pain.  Again, hold Remeron.  Will try to make this a gradual process for this patient as she seems very adverse to any change.

## 2017-07-24 NOTE — Patient Instructions (Signed)
Return with stool sample on Monday please.

## 2017-07-31 ENCOUNTER — Telehealth: Payer: Self-pay

## 2017-07-31 NOTE — Telephone Encounter (Signed)
noted 

## 2017-07-31 NOTE — Telephone Encounter (Signed)
Patient called stating she was scheduled for her MRCP testing at [facility]. Patient states [facility] called her and told her they do not do that type of imaging there. Patient would like to know why it was sent there. [facility]. Britt Boozericky will contact stephanie and find out why order was sent there.

## 2017-07-31 NOTE — Telephone Encounter (Signed)
Britt BoozerNicky called [facility] via phone and left a detail voicemail; I also Sent a detail e-mail with no answer; I am still waiting for the response.

## 2017-08-06 ENCOUNTER — Telehealth: Payer: Self-pay | Admitting: Internal Medicine

## 2017-08-06 NOTE — Telephone Encounter (Signed)
Patient called stating wants Nichole [name] call her back as soon as she can.  To Nichole [name] for further action.

## 2017-08-06 NOTE — Telephone Encounter (Signed)
Spoke with patient will be bring in stool specimen in the morning.

## 2017-08-19 ENCOUNTER — Other Ambulatory Visit: Payer: Self-pay | Admitting: Internal Medicine

## 2017-08-19 DIAGNOSIS — K863 Pseudocyst of pancreas: Secondary | ICD-10-CM

## 2017-08-19 DIAGNOSIS — R634 Abnormal weight loss: Secondary | ICD-10-CM

## 2017-08-19 DIAGNOSIS — K859 Acute pancreatitis without necrosis or infection, unspecified: Secondary | ICD-10-CM

## 2017-08-19 NOTE — Progress Notes (Signed)
Patient brought in her sample for fecal fat qualitative

## 2017-08-20 ENCOUNTER — Other Ambulatory Visit (INDEPENDENT_AMBULATORY_CARE_PROVIDER_SITE_OTHER): Payer: Self-pay

## 2017-08-20 DIAGNOSIS — K863 Pseudocyst of pancreas: Secondary | ICD-10-CM

## 2017-08-20 DIAGNOSIS — K920 Hematemesis: Secondary | ICD-10-CM

## 2017-08-20 DIAGNOSIS — K859 Acute pancreatitis without necrosis or infection, unspecified: Secondary | ICD-10-CM

## 2017-08-25 ENCOUNTER — Encounter: Payer: Self-pay | Admitting: Internal Medicine

## 2017-08-25 ENCOUNTER — Ambulatory Visit (INDEPENDENT_AMBULATORY_CARE_PROVIDER_SITE_OTHER): Payer: Self-pay | Admitting: Internal Medicine

## 2017-08-25 VITALS — BP 118/70 | HR 84 | Resp 12 | Ht <= 58 in | Wt 92.5 lb

## 2017-08-25 DIAGNOSIS — R200 Anesthesia of skin: Secondary | ICD-10-CM

## 2017-08-25 DIAGNOSIS — F17200 Nicotine dependence, unspecified, uncomplicated: Secondary | ICD-10-CM

## 2017-08-25 DIAGNOSIS — R634 Abnormal weight loss: Secondary | ICD-10-CM

## 2017-08-25 DIAGNOSIS — K863 Pseudocyst of pancreas: Secondary | ICD-10-CM

## 2017-08-25 DIAGNOSIS — R1013 Epigastric pain: Secondary | ICD-10-CM

## 2017-08-25 DIAGNOSIS — D7589 Other specified diseases of blood and blood-forming organs: Secondary | ICD-10-CM

## 2017-08-25 LAB — FECAL FAT, QUALITATIVE
FAT QUAL NEUTRAL STL: NORMAL
Fat Qual Total, Stl: NORMAL

## 2017-08-25 NOTE — Progress Notes (Signed)
Subjective:    Patient ID: Nichole [name], female    DOB: Apr 20, 1961, 56 y.o.   MRN: [id number]  HPI   1.  Abdominal discomfort/early satiety:  Qualitative fecal fat was normal.  Taking Omeprazole about 1/2 hour before eating breakfast.  Feels her epigastric pain is much improved.  Has gained a pound since last visit a little over 1 month ago.  Still with early satiety, but states now more so only with particular foods--like a big thick burger, she can only take 3-5 bites and then done.  Can eat other protein/fat sources like chicken, shrimp, fish and does not feel early satiety.   States not using the salad fixings she buys.  The vegetables go bad, but then she will go out to a restaurant and get a big chef's salad and eat the whole thing.   Is belching and passing flatus a lot--has not noted improvement with Omeprazole.  States when belches, gets pain radiating up right anterior chest, up and over right shoulder.  Sometimes gets to left chest and shoulder after belching. No alcohol intake since July Continues to smoke 1/2 ppd.  Afraid of using patches or meds to quit.  Smokes out of boredom.  Difficulty getting her to discuss nicotine patches--changes subject.    2.  Questions about thinning, curling nails.    3.  Macrocytosis with low normal B12 level.  Has not started B12 supplementation as discussed.  Liver enzymes, TSH okay in recent past.  Has had alcohol abuse in past.  4.  Left tongues numbness started November 30, 2016 while watching New Year's show.  Woke up the next morning with numbness of left perioral area.  Lasted about 1/2 day, then resolved. End of January, entire perioral area was numb an left cheek and nose numb. March--developed numbness of left tongue and now permanently so.  Developed left facial numbness for a couple of days, the resolved.  Again, tongue remains numb.  No biting of tongue accidentally--does feel when she bites down.  Taste is off as well.   No speaking  or swallowing symptoms. Not clear why she is concerned with this today as no change since beginning of year.  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  Felt to be remote at the time--not acute to the seizure event.  Current Meds  Medication Sig  . metoprolol tartrate (LOPRESSOR) 25 MG tablet Take 25 mg by mouth. 1/4 of a tablet daily  . omeprazole (PRILOSEC) 20 MG capsule 1 cap by mouth once daily 1/2 hour before meals   Allergies  Allergen Reactions  . Lisinopril     REACTION: ? facial angioedema      Review of Systems     Objective:   Physical Exam  NAD HEENT:  PERRL, EOMI, Discs sharp Neck:  Supple, No adenopathy Chest:  CTA CV:  RRR without murmur or rub Abd:  S, + tenderness, especially midepigastrium. No rebound or periotoneal signs.No HSM or mass, + BS Neuro:  A & O x 3, CN II-XII grossly intact save for decreased sensation to pinprick across left forehead and maxillary area, but same on both sides on upper lip, lower face and chin.  Motor 5/5 throughout, DTRs 2+/4 throughout       Assessment & Plan:  1.  Abdominal pain/chronic pancreatitis/poor weight gain/GERD/ possible pancreatic pseudocyst:  No pancreatic enzymes for now.  Still waiting on MR of abdomen and  MRCP for reevaluation of previous complex pseudocyst with continued epigastric pain and poor appetite.  2.  Macrocytosis with borderline low B12 level:  To get started on B12 supplements  3.  Tobacco Abuse:  Again strongly encouraged smoking cessation.  4.  Facial and tongue symptoms:  This seems fairly fixed and relatively distant occurrence.  Will check into CT vs. MRI of brain to evaluate any new strokes from 2009 studies, but more concerned at this point with obtaining study of abdomen/pancreas.

## 2017-08-25 NOTE — Patient Instructions (Signed)
Please get started on Vitamin B12 1000 mg daily  Tobacco Cessation:   [id number] or [id number], the former for support and possibly free nicotine patches/gum and support; the latter for [facility] Smoking cessation class. Get rid of all smoking supplies:  Cigarettes, lighters, ashtrays--no stashes just in case at home if you are serious.  For nicotine patches:  Stop smoking anything the day you start the first patch Start with 21 mg patch and reapply new to different area of skin every 24 hours for 30 days. Then 14 mg patch changed every 24 hours for 14 days. Then 7 mg patch changed every 24 hours for 14 days.

## 2017-09-18 ENCOUNTER — Encounter: Payer: Self-pay | Admitting: Internal Medicine

## 2017-09-18 ENCOUNTER — Ambulatory Visit (INDEPENDENT_AMBULATORY_CARE_PROVIDER_SITE_OTHER): Payer: Self-pay | Admitting: Internal Medicine

## 2017-09-18 VITALS — BP 118/70 | HR 98 | Resp 12 | Ht <= 58 in | Wt 96.0 lb

## 2017-09-18 DIAGNOSIS — F329 Major depressive disorder, single episode, unspecified: Secondary | ICD-10-CM

## 2017-09-18 DIAGNOSIS — K863 Pseudocyst of pancreas: Secondary | ICD-10-CM

## 2017-09-18 DIAGNOSIS — F419 Anxiety disorder, unspecified: Secondary | ICD-10-CM

## 2017-09-18 DIAGNOSIS — F32A Depression, unspecified: Secondary | ICD-10-CM

## 2017-09-18 NOTE — Patient Instructions (Signed)
We are working to get you an appointment [facility].  Please keep your phone with you.

## 2017-09-18 NOTE — Progress Notes (Signed)
   Subjective:    Patient ID: Nichole [name], female    DOB: 1961/02/26, 56 y.o.   MRN: [id number]  HPI   1.  Complex pancreatic pseudocyst:  Had her MR/MRCP last week.    IMPRESSION: 1. Large complex pseudocyst extending from the region of the head/uncinate process of the pancreas extending up into the lower chest. Completely surrounds the portal vein and is increased in size compared to 02/27/2016 10.9 cm x 5.9 cm x 2.7 cm 2. Pancreatic ductal dilatation and side branch dilatation of the mid body of the pancreas through the tail. This suggests pancreatic duct stricture versus subtle mass in the mid body of the pancreas  She feels some of her epigastric discomfort is improved since taking Omeprazole regularly. Is having sense of discomfort in chest, epigastrium, RUQ and into right shoulder at times.  Feels like she has lots of gas in her chest.  Has gained 4 lbs since last visit in August.  Current Meds  Medication Sig  . metoprolol tartrate (LOPRESSOR) 25 MG tablet Take 25 mg by mouth. 1/4 of a tablet daily  . omeprazole (PRILOSEC) 20 MG capsule 1 cap by mouth once daily 1/2 hour before meals  . vitamin B-12 (CYANOCOBALAMIN) 1000 MCG tablet Take 1 tablet (1,000 mcg total) by mouth daily.    Allergies  Allergen Reactions  . Lisinopril     REACTION: ? facial angioedema       Review of Systems     Objective:   Physical Exam Anxious Lungs:  CTA and resonant to percussion at bases bilaterally CV:  RRR without murmur or rub. Abd:  S, moderate tenderness in midepigastrium and RUQ predominantly.  Milder tenderness in LUQ.  + BS.  No definite HSM or mass,  LE:  No edema.       Assessment & Plan:  1.  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   Also may have a stricture vs subtle mass obstruction of pancreatic duct mid body of pancreas. Discussed with [facility] radiology.   No GI locally performs endoscopic drainage through stomach.  [facility]. Patient does not have someone who can drive [facility].  [place] [facility] [place], though doubt they will transport out of county. Continue Omeprazole for now as some of her symptoms have improved and has had some weight gain.  2.  Anxiety/Depression:  Call into [name].  Left message with her earlier today.  Apparent concern for housing as well.    Addendum:  Spoke with Dr. Boyd KerbsPawa:  We are to call [name] at office:  [phone] and speak with her about an appt. With Dr Boyd KerbsPawa.  In meantime, to fax her records to [facility] at [phone].  He will look at her records and imaging to decide best way to drain the pseudocyst.

## 2017-09-18 NOTE — Progress Notes (Signed)
Referral, OV notes and blood work faxed to [name].

## 2017-09-25 ENCOUNTER — Telehealth: Payer: Self-pay | Admitting: Internal Medicine

## 2017-09-25 NOTE — Telephone Encounter (Signed)
Patient came in yesterday to drop off Financial Application for [facility] but it was incomplete.  Spoke to patient today and asked to bring proof of income and a copy of photo ID so application can be fax [facility].  Patient states she will come in today with requested information and will also call Dr. Boyd KerbsPawa to set up appointment after Financial information is sent over.  Nichole [name] called [facility]ole [name] at [facility] today [phone] (she is out of the office from 09/25/17 to 10/05/17)  To verify Dr. Boyd KerbsPawa had review patient's record and to inform that Financial Application was going to be fax over today. Also to inquiry about the appointment for patient.  Patient came in today with documentation requested. Stated [facility] called her on [place]. And today this morning but she has not been able to answer the calls. Nichole [name] called the phone number provided by patient [phone] and was not able to schedule appointment until patient is cleared by Financial Assistance. Asked for fax number so fax over application and staff was not able to provide one since patient has to speak to a financial asst. First. Left VM for them to contact patient again.   Patient's application is at the front desk

## 2017-11-13 ENCOUNTER — Telehealth: Payer: Self-pay | Admitting: Internal Medicine

## 2017-11-13 NOTE — Telephone Encounter (Signed)
Spoke with patient states she is having more pain on right side and under her right breast. States she has not heard from [facility] and she wants to get in as soon as possible

## 2017-11-13 NOTE — Telephone Encounter (Signed)
Patient wants information regarding [facility].

## 2017-11-17 NOTE — Telephone Encounter (Signed)
Left detailed message for patient. Needs appointment to address pain management.

## 2017-11-27 ENCOUNTER — Encounter: Payer: Self-pay | Admitting: Internal Medicine

## 2017-11-27 ENCOUNTER — Ambulatory Visit: Payer: Self-pay | Admitting: Internal Medicine

## 2017-11-27 ENCOUNTER — Telehealth: Payer: Self-pay | Admitting: Internal Medicine

## 2017-11-27 VITALS — BP 122/82 | HR 84 | Resp 12 | Ht <= 58 in | Wt 97.0 lb

## 2017-11-27 DIAGNOSIS — K860 Alcohol-induced chronic pancreatitis: Secondary | ICD-10-CM

## 2017-11-27 DIAGNOSIS — F17200 Nicotine dependence, unspecified, uncomplicated: Secondary | ICD-10-CM

## 2017-11-27 DIAGNOSIS — K863 Pseudocyst of pancreas: Secondary | ICD-10-CM

## 2017-11-27 MED ORDER — HYDROCODONE-ACETAMINOPHEN 5-325 MG PO TABS: ORAL_TABLET | ORAL | 0 refills | Status: DC

## 2017-11-27 NOTE — Telephone Encounter (Signed)
To Dr. Mulberry for further direction 

## 2017-11-27 NOTE — Progress Notes (Signed)
   Subjective:    Patient ID: Nichole [name], female    DOB: 1961/05/10, 56 y.o.   MRN: [id number]  HPI   1.  Enlarging complex pancreatic pseudocyst and pancreatic duct stricutre vs. Subtle mass in mid body of the pancreas:  Has not been able to get [facility] Boyd KerbsPawa as she was delayed in getting financial assistance paperwork in and then the financial assistance at [facility] has been put on a hold for unknown reason.  She has been on phone to Financial Assistance on Monday and told there is a backlog.   She would be willing to go elsewhere and does have transportation to get there now. Her pain in RUQ/epigastric area is still "bad" stating it is an "11"  Out of scale of 0-10.  Radiates at times to right shoulder.  Not having as much gas/belching at this time.  Has nausea when pain is bad.  Missed work at beginning of week due to pain. She is eating.  Appetite is fine.  When she eats, does not cause increased pain.  No melena or hematochezia.   Has gained 1 lb since last visit in October.  Did drink alcohol in December when snowed in.  Unable to get her to quantify.  She [facility] and counselor aware.  Counselor has recommended group counseling, but patient refuses group work. She later admits she has had 4 episodes of drinking at least 2 shots daily for up to 4 days in a row.    Current Meds  Medication Sig  . metoprolol tartrate (LOPRESSOR) 25 MG tablet Take 25 mg by mouth. 1/4 of a tablet daily  . omeprazole (PRILOSEC) 20 MG capsule 1 cap by mouth once daily 1/2 hour before meals  . vitamin B-12 (CYANOCOBALAMIN) 1000 MCG tablet Take 1 tablet (1,000 mcg total) by mouth daily.    Allergies  Allergen Reactions  . Lisinopril     REACTION: ? facial angioedema      Review of Systems     Objective:   Physical Exam   Leaning forward at times with discomfort. HEENT:  PERRL, EOMI,  Lungs:  CTA CV:  RRR without murmur or rub, radial and DP  pulses normal and equal Abd:  Guards with palpation of midepigastrium and RUQ, though some of this also due to general sensitivity to touch, + BS, No HSM or mass appreciated. LE:  No edema Right wrist with previous dorsal radial ganglion cyst--shows me another that appears to be in communication with dorsal cyst at lateral or radial aspect of wrist.  Nontender.        Assessment & Plan:  1.  Complex Pseudocyst, enlarging with possible other pancreatic mass or pancreatic duct stricture.  She will check with her transportation today and get back as to whether they can take [facility]--initially was unequivocal about being able to get there, but before leaving, stated she needs to confirm. Discussed absolutely no alcohol use. Hydrocodone/Acetaminophen for severe pain only #30.  2.  Ganglion cysts of right wrist:  Not causing functional or pain issues as this point.  Follow  3.  Tobacco:  Still not ready to address.

## 2017-11-27 NOTE — Patient Instructions (Signed)
Call this morning if possible to confirm whether or not you have transportation [place] [facility]

## 2017-11-27 NOTE — Telephone Encounter (Signed)
Patient called stating was calling to let Dr. Delrae AlfredMulberry knows she will have transportation to [facility] and it is ok to start the process.  Cherice please Advise.

## 2017-12-09 NOTE — Telephone Encounter (Signed)
Did the referral get rerouted?  That is all that needs to be done.

## 2017-12-29 NOTE — Telephone Encounter (Signed)
Spoke with patient and [facility] gastro [facility]. Referral was held dur to not having [facility]. Refaxed referral and notes . Will contact patient within next 24-48 hours to schedule appointment for patient. Patient is aware.

## 2018-01-10 ENCOUNTER — Emergency Department (HOSPITAL_COMMUNITY): Payer: Self-pay

## 2018-01-10 ENCOUNTER — Emergency Department (HOSPITAL_COMMUNITY)
Admission: EM | Admit: 2018-01-10 | Discharge: 2018-01-11 | Disposition: A | Discharge status: Home / Self Care | Payer: Self-pay | Attending: Physician Assistant | Admitting: Physician Assistant

## 2018-01-10 ENCOUNTER — Encounter (HOSPITAL_COMMUNITY): Payer: Self-pay | Admitting: Emergency Medicine

## 2018-01-10 DIAGNOSIS — R200 Anesthesia of skin: Secondary | ICD-10-CM | POA: Insufficient documentation

## 2018-01-10 DIAGNOSIS — R079 Chest pain, unspecified: Secondary | ICD-10-CM

## 2018-01-10 DIAGNOSIS — F17210 Nicotine dependence, cigarettes, uncomplicated: Secondary | ICD-10-CM | POA: Insufficient documentation

## 2018-01-10 DIAGNOSIS — R0602 Shortness of breath: Secondary | ICD-10-CM | POA: Insufficient documentation

## 2018-01-10 DIAGNOSIS — I10 Essential (primary) hypertension: Secondary | ICD-10-CM | POA: Insufficient documentation

## 2018-01-10 DIAGNOSIS — R6883 Chills (without fever): Secondary | ICD-10-CM | POA: Insufficient documentation

## 2018-01-10 DIAGNOSIS — Z79899 Other long term (current) drug therapy: Secondary | ICD-10-CM | POA: Insufficient documentation

## 2018-01-10 DIAGNOSIS — R110 Nausea: Secondary | ICD-10-CM | POA: Insufficient documentation

## 2018-01-10 LAB — LIPASE, BLOOD: Lipase: 117 U/L — ABNORMAL HIGH (ref 11–51)

## 2018-01-10 LAB — COMPREHENSIVE METABOLIC PANEL
ALK PHOS: 88 U/L (ref 38–126)
ALT: 15 U/L (ref 14–54)
AST: 27 U/L (ref 15–41)
Albumin: 3.8 g/dL (ref 3.5–5.0)
Anion gap: 14 (ref 5–15)
BILIRUBIN TOTAL: 1.1 mg/dL (ref 0.3–1.2)
BUN: 12 mg/dL (ref 6–20)
CALCIUM: 9.4 mg/dL (ref 8.9–10.3)
CO2: 22 mmol/L (ref 22–32)
Chloride: 100 mmol/L — ABNORMAL LOW (ref 101–111)
Creatinine, Ser: 0.71 mg/dL (ref 0.44–1.00)
GFR calc non Af Amer: >60 mL/min (ref >60)
Glucose, Bld: 104 mg/dL — ABNORMAL HIGH (ref 65–99)
Potassium: 4.5 mmol/L (ref 3.5–5.1)
Sodium: 136 mmol/L (ref 135–145)
TOTAL PROTEIN: 7.5 g/dL (ref 6.5–8.1)

## 2018-01-10 LAB — CBC
HCT: 41.9 % (ref 36.0–46.0)
Hemoglobin: 13.8 g/dL (ref 12.0–15.0)
MCH: 34.2 pg — ABNORMAL HIGH (ref 26.0–34.0)
MCHC: 32.9 g/dL (ref 30.0–36.0)
MCV: 104 fL — ABNORMAL HIGH (ref 78.0–100.0)
Platelets: 232 10*3/uL (ref 150–400)
RBC: 4.03 MIL/uL (ref 3.87–5.11)
RDW: 14.5 % (ref 11.5–15.5)
WBC: 6.8 10*3/uL (ref 4.0–10.5)

## 2018-01-10 LAB — I-STAT BETA HCG BLOOD, ED (MC, WL, AP ONLY): I-stat hCG, quantitative: <5 m[IU]/mL (ref <5)

## 2018-01-10 LAB — I-STAT TROPONIN, ED: TROPONIN I, POC: 0 ng/mL (ref 0.00–0.08)

## 2018-01-10 LAB — D-DIMER, QUANTITATIVE (NOT AT ARMC): D DIMER QUANT: 0.8 ug{FEU}/mL — AB (ref 0.00–0.50)

## 2018-01-10 MED ORDER — FENTANYL CITRATE (PF) 100 MCG/2ML IJ SOLN
50.0000 ug | Freq: Once | INTRAMUSCULAR | Status: AC
  Administered 2018-01-10: 50 ug via INTRAVENOUS
  Filled 2018-01-10: qty 2

## 2018-01-10 MED ORDER — ONDANSETRON 4 MG PO TBDP: 4.0000 mg | ORAL_TABLET | Freq: Three times a day (TID) | ORAL | 0 refills | Status: DC | PRN

## 2018-01-10 MED ORDER — IOPAMIDOL (ISOVUE-300) INJECTION 61%
INTRAVENOUS | Status: AC
  Administered 2018-01-10: 100 mL via INTRAVENOUS
  Filled 2018-01-10: qty 100

## 2018-01-10 MED ORDER — SODIUM CHLORIDE 0.9 % IV BOLUS (SEPSIS)
1000.0000 mL | Freq: Once | INTRAVENOUS | Status: AC
  Administered 2018-01-10: 1000 mL via INTRAVENOUS

## 2018-01-10 NOTE — ED Notes (Signed)
Patient transported to CT 

## 2018-01-10 NOTE — ED Notes (Signed)
Pt arrived to room from Xray

## 2018-01-10 NOTE — ED Provider Notes (Signed)
[facility] EMERGENCY DEPARTMENT Provider Note   CSN: [id number] Arrival date & time: 01/10/18  1821     History   Chief Complaint Chief Complaint  Patient presents with  . Chest Pain    HPI Nichole [name] is a 57 y.o. female with past medical history significant for pancreatitis and pseudocyst that was recently found to be enlarging.  Is currently scheduled for an MRCP.  She is presenting today with sudden onset substernal chest pain which awoke her from sleep this morning and has been intermittent since.  She does report some radiation at the time towards the left side of her chest and one time in her left arm.  Her pain is aggravated by deep breaths.  She reports associated colds sweats and nausea.  She denies any chest pain or other symptoms at this time. Also describes bilateral numbness in her hands and feet after the episode this morning.  Denies fever, abdominal pain, vomiting, diarrhea. Patient denies history of DVT/PE, prolonged immobilization, recent surgery, history of malignancy, estrogen use, hemoptysis, cough, leg pain or swelling.  HPI  Past Medical History:  Diagnosis Date  . Acute pancreatitis 12/05/2010, 01/2016   with complex pseudocyst, attributed to ETOH.  2017: pseudocyst, acute on chronic pancreatitis. pancreatic calcifications.  . ANEMIA, B12 DEFICIENCY 04/05/2009   macrocytic  . EXCESSIVE MENSTRUAL BLEEDING 03/18/2010   uterine fibroids. gyn eval and bx neg  . Hematemesis 01/2016   in setting pancreatitispseudocyst.  . HYPERTENSION 01/29/2008  . LEG PAIN, BILATERAL 06/22/2008  . Palpitations 01/02/2009   States heart rate has been up to 180, though states has never been seen when having fast heart rate.  Marland Kitchen PERIMENOPAUSAL SYNDROME 08/23/2009  . RASH 12/23/2010  . 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 SYNCOPE 01/02/2009  . THYROID FUNCTION TEST, ABNORMAL 03/19/2009  . TOBACCO USE 01/25/2008  . WEIGHT  LOSS-ABNORMAL 12/05/2010    Patient Active Problem List   Diagnosis Date Noted  . 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 Anxiety and depression 07/17/2017  . Pseudocyst of pancreas   . Hematemesis   . Esophageal stricture   . Acute pancreatitis 02/27/2016  . Alcohol abuse 02/27/2016  . Does not have health insurance 06/06/2014  . History of chest pain at rest 06/06/2014  . Ganglion cyst of wrist 06/06/2014  . Visit for preventive health examination 02/22/2013  . Change in bowel habits 02/22/2013  . History of positive PPD 02/22/2013  . Neuropathy 08/26/2012  . Numbness and tingling of both legs below knees 08/26/2012  . Anterior leg pain 05/13/2012  . Edema 05/13/2012  . Pseudocyst, pancreas 02/22/2011  . GASTROINTESTINAL XRAY, ABNORMAL 12/16/2010  . Weight loss 12/05/2010  . Abdominal pain 12/03/2010  . SWEATING 09/23/2010  . Deficiency anemia 08/23/2009  . PERIMENOPAUSAL SYNDROME 08/23/2009  . ANEMIA, B12 DEFICIENCY 04/05/2009  . THYROID FUNCTION TEST, ABNORMAL 03/19/2009  . SYNCOPE 01/02/2009  . PALPITATIONS 01/02/2009  . LEG PAIN, BILATERAL 06/22/2008  . UNSPECIFIED ANEMIA 02/14/2008  . Essential hypertension 01/29/2008  . TOBACCO USE 01/25/2008    Past Surgical History:  Procedure Laterality Date  . ESOPHAGOGASTRODUODENOSCOPY (EGD) WITH PROPOFOL N/A 02/28/2016   Procedure: ESOPHAGOGASTRODUODENOSCOPY (EGD) WITH PROPOFOL;  Surgeon: Napoleon Form, MD;  Location: [facility];  Service: Endoscopy;  Laterality: N/A;  . KNEE SURGERY Left 1998   Arthroscopic--for torn meniscus    OB History    Gravida Para Term Preterm AB Living   0  0 0 0 0 0   SAB TAB Ectopic Multiple Live Births   0 0 0 0         Home Medications    Prior to Admission medications   Medication Sig Start Date End Date Taking? Authorizing Provider  metoprolol tartrate (LOPRESSOR) 25 MG tablet Take 6.25 mg by mouth daily as needed (rapid heart rate/ high blood pressure). 1/4  tablet as needed   Yes [provider]  HYDROcodone-acetaminophen (NORCO/VICODIN) 5-325 MG tablet 1-2 tabs by mouth every 4 to 6 hours as needed for severe pain Patient not taking: Reported on 01/10/2018 11/27/17   Julieanne MansonMulberry, Elizabeth, MD  omeprazole (PRILOSEC) 20 MG capsule 1 cap by mouth once daily 1/2 hour before meals Patient not taking: Reported on 01/10/2018 07/17/17   Julieanne MansonMulberry, Elizabeth, MD  vitamin B-12 (CYANOCOBALAMIN) 1000 MCG tablet Take 1 tablet (1,000 mcg total) by mouth daily. Patient not taking: Reported on 01/10/2018 07/24/17   Julieanne MansonMulberry, Elizabeth, MD    Family History Family History  Problem Relation Age of Onset  . Hypertension Mother   . Kidney disease Mother        removed for cyst--not clear if this was a malignancy or not  . Other Mother        overweight--lost a lot following diagnosis of diabetes  . Diabetes Mother   . Heart attack Father   . Stroke Father        died age 55 yo--had multiple TIAs  . Muscular dystrophy Father   . Hypertension Brother   . Neuromuscular disorder Unknown        spinomuscular atrophy    Social History Social History   Tobacco Use  . Smoking status: Current Every Day Smoker    Packs/day: 0.50    Years: 36.00    Pack years: 18.00    Types: Cigarettes  . Smokeless tobacco: Never Used  . Tobacco comment: Not ready to quit.  Does not want patches or other medication support.  Not a "medication"  person.    Substance Use Topics  . Alcohol use: Yes    Comment: Began drinking a lot in mid 30s.  [facility] [employer] [facility] [place].  Has depression and anxiety.  Leonor LivAnya Cuebas-Colon, MSW, LCSWA.  Drank alcohol again in December 2018 when snowed in.  . Drug use: No     Allergies   Lisinopril   Review of Systems Review of Systems  Constitutional: Positive for chills and diaphoresis. Negative for fever.       Patient describing cold sweats.  HENT: Negative for congestion and sore throat.   Respiratory: Positive for chest  tightness and shortness of breath. Negative for cough, choking, wheezing and stridor.   Cardiovascular: Positive for chest pain. Negative for leg swelling.  Gastrointestinal: Positive for nausea. Negative for abdominal distention, abdominal pain, blood in stool, diarrhea and vomiting.  Genitourinary: Negative for difficulty urinating, dysuria, flank pain and hematuria.  Musculoskeletal: Negative for gait problem, neck pain and neck stiffness.  Skin: Negative for color change, pallor and rash.  Neurological: Positive for numbness. Negative for dizziness, tremors, seizures, syncope, facial asymmetry, speech difficulty, weakness, light-headedness and headaches.       Bilateral numbness in the hands and feet earlier  Psychiatric/Behavioral: Negative for behavioral problems.     Physical Exam Updated Vital Signs BP 103/70   Pulse 94   Temp 98.4 F (36.9 C) (Oral)   Resp (!) 23   Ht 5' (1.524 m)   Wt  45.4 kg (100 lb)   SpO2 100%   BMI 19.53 kg/m   Physical Exam  Constitutional: She appears well-developed and well-nourished.  Non-toxic appearance. She does not appear ill. No distress.  Patient is afebrile, chronically ill-appearing and slightly cachectic sitting comfortably in bed no acute distress.  Speaking in full sentences.  HENT:  Head: Atraumatic.  Eyes: EOM are normal.  Neck: Normal range of motion. Neck supple.  Cardiovascular: Normal rate and regular rhythm.  Pulmonary/Chest: Effort normal and breath sounds normal. No accessory muscle usage or stridor. No tachypnea. No respiratory distress.  Abdominal: Soft. She exhibits no distension. There is tenderness. There is no rebound and no guarding.  Epigastric tenderness to deep palpation only  Musculoskeletal: Normal range of motion.       Right lower leg: Normal. She exhibits no tenderness and no edema.       Left lower leg: Normal. She exhibits no tenderness and no edema.  Neurological: She is alert.  Skin: Skin is warm and  dry. No rash noted. She is not diaphoretic. No erythema. No pallor.  Psychiatric: She has a normal mood and affect. Her behavior is normal.  Nursing note and vitals reviewed.    ED Treatments / Results  Labs (all labs ordered are listed, but only abnormal results are displayed) Labs Reviewed  LIPASE, BLOOD - Abnormal; Notable for the following components:      Result Value   Lipase 117 (*)    All other components within normal limits  COMPREHENSIVE METABOLIC PANEL - Abnormal; Notable for the following components:   Chloride 100 (*)    Glucose, Bld 104 (*)    All other components within normal limits  CBC - Abnormal; Notable for the following components:   MCV 104.0 (*)    MCH 34.2 (*)    All other components within normal limits  D-DIMER, QUANTITATIVE (NOT AT [facility]) - Abnormal; Notable for the following components:   D-Dimer, Quant 0.80 (*)    All other components within normal limits  URINALYSIS, ROUTINE W REFLEX MICROSCOPIC  I-STAT TROPONIN, ED  I-STAT BETA HCG BLOOD, ED ([facility], [facility], [facility] ONLY)    EKG  EKG Interpretation None       Radiology Dg Chest 2 View  Result Date: 01/10/2018 CLINICAL DATA:  Chest pain EXAM: CHEST  2 VIEW COMPARISON:  February 27, 2016 FINDINGS: There is soft tissue prominence posterior to the heart in the lower middle mediastinum. No air-fluid level to suggest hiatal hernia evident in this area. Elsewhere, there is no evidence of edema or consolidation. The heart size is normal. The pulmonary vascularity is normal. There is aortic atherosclerosis. The hila appear normal. No bone lesions. IMPRESSION: Soft tissue fullness in the lower middle mediastinum. This finding was not appreciable on prior chest radiograph. Differential considerations include hiatal hernia, middle mediastinal mass, or adenopathy. This somewhat unusual appearance warrants contrast enhanced chest CT to further evaluate. No edema or consolidation. Heart not enlarged. There is aortic  atherosclerosis. Aortic Atherosclerosis (ICD10-I70.0). Electronically Signed   By: Bretta Bang III M.D.   On: 01/10/2018 19:04   Ct Chest W Contrast  Result Date: 01/10/2018 CLINICAL DATA:  Abdominal pain and chest pain associated with pancreatic cyst per patient. EXAM: CT CHEST WITH CONTRAST TECHNIQUE: Multidetector CT imaging of the chest was performed during intravenous contrast administration. CONTRAST:  ISOVUE-300 IOPAMIDOL (ISOVUE-300) INJECTION 61% COMPARISON:  CT abdomen and pelvis 02/27/2016 FINDINGS: Cardiovascular: Size heart without pericardial effusion. Aortic atherosclerosis without aneurysm  or dissection. No large central pulmonary embolus. Mediastinum/Nodes: Posterior mediastinal cystic mass protruding through the diaphragmatic hiatus and contiguous with the partially visualized pancreas likely represents a continuation of the patient's known pancreatic cystic mass, more likely representing an enlarged pseudocyst, currently measuring 15.7 x 5 x 9.7 cm (CC by AP by transverse. Another differential possibilities may include an enteric duplication cyst. Given lack of haustral markings, herniated colon is believed unlikely. No adenopathy. The stomach is seen below the left hemidiaphragm and findings are not believed to be due to a hiatal hernia.) Lungs/Pleura: Centrilobular emphysema. No pulmonary mass or pneumonic consolidation. No effusion or pneumothorax. Upper Abdomen: Pancreatic calcifications compatible with chronic pancreatitis. Cystic mass is partially included within the upper abdomen off the pancreatic body. Musculoskeletal: No acute nor suspicious osseous abnormalities. IMPRESSION: 1. 15.7 x 5 x 9.7 cm cystic mass in the posterior mediastinum emanating from the upper abdomen likely off the pancreas from the patient's known pseudocyst. 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 2. Centrilobular emphysema. 3. Aortic atherosclerosis without dissection or  aneurysm. Aortic Atherosclerosis (ICD10-I70.0) and Emphysema (ICD10-J43.9). Electronically Signed   By: Tollie Eth M.D.   On: 01/10/2018 21:08    Procedures Procedures (including critical care time)  Medications Ordered in ED Medications  iopamidol (ISOVUE-300) 61 % injection (100 mLs Intravenous Contrast Given 01/10/18 2010)  sodium chloride 0.9 % bolus 1,000 mL (1,000 mLs Intravenous New Bag/Given 01/10/18 2128)  fentaNYL (SUBLIMAZE) injection 50 mcg (50 mcg Intravenous Given 01/10/18 2128)     Initial Impression / Assessment and Plan / ED Course  I have reviewed the triage vital signs and the nursing notes.  Pertinent labs & imaging results that were available during my care of the patient were reviewed by me and considered in my medical decision making (see chart for details).    Patient presenting with sudden onset substernal chest pain described as a tight pressure which has been intermittent and pleuritic in nature radiating to the left at times.  Patient denies any chest pain, abdominal pain, nausea at this time.  X-ray with mediastinal mass requiring CT imaging for further evaluation.  CT chest with contrast ordered  Lipase elevated 117 Will provide analgesia, start IV fluids and reassess  On reassessment, patient improved. CT showing large pancreatic cyst  "15.7 x 5 x 9.7 cm cystic mass in the posterior mediastinum emanating from the upper abdomen likely off the pancreas from the patient's known pseudocyst.  Patient has a MRCP scheduled to further evaluate the currently enlarging pancreatic pseudocyst and possible pancreatic duct stricture.  Consulted Gen surg who recommends follow up at [facility] where the procedure will need to be performed.  Work up otherwise unremarkable. No PE and low suspicion for ACS.  Encouraged patient to follow up and consulted case management for assistance with financial constraints. Patient was stable and ready to go home. DC home with  symptomatic relief and close follow up.  Discussed strict return precautions and advised to return to the emergency department if experiencing any new or worsening symptoms. Instructions were understood and patient agreed with discharge plan. Final Clinical Impressions(s) / ED Diagnoses   Final diagnoses:  Nonspecific chest pain    ED Discharge Orders    None       Gregary Cromer 01/10/18 2316    Abelino Derrick, MD 01/11/18 0011

## 2018-01-10 NOTE — Discharge Instructions (Addendum)
As discussed, follow up with your primary care provider and go to your appointment for MRCP imaging. Case manager was consulted to assist you with this process. They should be calling you tomorrow.

## 2018-01-10 NOTE — ED Triage Notes (Signed)
Pt to ER for evaluation of abdominal pain and chest pain associated with pancreatic cyst, per patient. VSS. A/o x4.

## 2018-01-12 ENCOUNTER — Telehealth: Payer: Self-pay | Admitting: Internal Medicine

## 2018-01-12 NOTE — Telephone Encounter (Signed)
Patient was in ER this past Sunday and wanted to find our more information regarding [facility].

## 2018-01-13 NOTE — Telephone Encounter (Signed)
Spoke with patient and [facility]. Patient has appointment on 01/26/18 @ 12 pm with Dr. Dorene GrebeNatalie. L[facility]. Patient is aware. Informed to not worry about [facility] since she now has [facility].

## 2018-01-18 ENCOUNTER — Other Ambulatory Visit: Payer: Self-pay

## 2018-01-18 MED ORDER — METOPROLOL TARTRATE 25 MG PO TABS: 6.2500 mg | ORAL_TABLET | Freq: Every day | ORAL | 6 refills | Status: DC | PRN

## 2018-01-18 MED ORDER — OMEPRAZOLE 20 MG PO CPDR: DELAYED_RELEASE_CAPSULE | ORAL | 4 refills | Status: DC

## 2018-01-18 NOTE — Telephone Encounter (Signed)
Rx sent to pharmacy   

## 2018-01-18 NOTE — Telephone Encounter (Signed)
Patient needs metoprolol tartrate (LOPRESSOR) 25 mg.  Patient also needs omeprazole (PRILOSEC) 20 mg capsule.

## 2018-01-19 ENCOUNTER — Other Ambulatory Visit: Payer: Self-pay

## 2018-01-19 MED ORDER — OMEPRAZOLE 20 MG PO CPDR: DELAYED_RELEASE_CAPSULE | ORAL | 4 refills | Status: DC

## 2018-01-19 MED ORDER — METOPROLOL TARTRATE 25 MG PO TABS: 6.2500 mg | ORAL_TABLET | Freq: Every day | ORAL | 6 refills | Status: DC | PRN

## 2018-01-26 ENCOUNTER — Telehealth: Payer: Self-pay | Admitting: Internal Medicine

## 2018-01-26 NOTE — Telephone Encounter (Signed)
Spoke with patient and states she is not feeling well and needed to reschedule appointment. Patient spoke with Dr. Delrae AlfredMulberry and informed her she needed to go while she is not feeling well. Patient stated she rescheduled [facility] for [facility]/09/18.   I spoke with with [facility] scheduling and they confirmed patient rescheduled appointment for 02/09/18 @ 12 pm.

## 2018-01-26 NOTE — Telephone Encounter (Signed)
[facility] appointment [facility] and wants to see if nurse could process an appointment for [facility] for referral.   Patient can be reached at  [phone].

## 2018-02-26 ENCOUNTER — Encounter: Payer: Self-pay | Admitting: Internal Medicine

## 2018-02-26 ENCOUNTER — Ambulatory Visit: Payer: Self-pay | Admitting: Internal Medicine

## 2018-02-26 VITALS — BP 110/78 | HR 94 | Resp 12 | Ht 59.0 in | Wt 101.0 lb

## 2018-02-26 DIAGNOSIS — K863 Pseudocyst of pancreas: Secondary | ICD-10-CM

## 2018-02-26 DIAGNOSIS — K860 Alcohol-induced chronic pancreatitis: Secondary | ICD-10-CM

## 2018-02-26 DIAGNOSIS — R002 Palpitations: Secondary | ICD-10-CM

## 2018-02-26 DIAGNOSIS — F17200 Nicotine dependence, unspecified, uncomplicated: Secondary | ICD-10-CM

## 2018-02-26 DIAGNOSIS — R1013 Epigastric pain: Secondary | ICD-10-CM

## 2018-02-26 DIAGNOSIS — D7589 Other specified diseases of blood and blood-forming organs: Secondary | ICD-10-CM

## 2018-02-26 NOTE — Patient Instructions (Addendum)
Please restart your Vitamin B12 daily. When you do, call the clinic and schedule a repeat CBC in 2 months. Please speak with your counselor seriously about treatment for alcohol use. We will focus on the tobacco once you have worked on the alcohol.

## 2018-02-26 NOTE — Progress Notes (Signed)
Subjective:    Patient ID: Nichole [name], female    DOB: Jun 14, 1961, 57 y.o.   MRN: [id number]  HPI   1.  Complex Pseudocyst:  Has enlarged on CT scan from 01/10/18 when went to ED with chest pain.   Has been seen by Oswaldo Conroy, FNP, GI at [facility].   Planning for endoscopic intervention.  Date not set yet.  Financial assistance Patient with multiple complaints about length of time getting her care.  Discussed need for her to follow through appropriately through attempted steps to get her definitive care over past year.  Weight is up a bit.  Describes eating small amounts and eating Nutrigrain bars as well as Ensure.  Does not eat until about noon daily.  Some of her eating habits predate her problems with pancreatitis and pseudocyst.    2.  Alcohol Abuse:  As per note from [facility], patient continues to drink alcohol.  Have not been able to get her to pursue treatment.  Still counseling at [facility] and states her counselor is aware of her alcohol use.  Has a new counselor-Dorissa, does not recall her last name.   She has recommended patient get into group therapy.   They have touched on alcohol treatment programs.    3.  Palpitations:  Question about her Metoprolol, which she chooses to take 1/4  Of 25 mg tab as needed for palpitations rather than regularly to prevent.  She brings in newer pills from [facility], which do look different, but called pharmacy and these indeed are Metoprolol.   4.  Tobacco Abuse:  Interested in quitting on her own.  Not interested in medication or nicotine replacement therapy.  Smoking 1/2 ppd. Discussed the CT scan of her abdomen also showed emphysema and her pulmonary function would decline more rapidly if she did not stop smoking.    5.  Macrocytosis without anemia:  Likely secondary to alcohol abuse.  Did have low normal B12 level, however, and took B12 supplements for 2 months, then ran out and did not refill.  Cannot say how long ago she stopped the  B12, maybe in December.    Current Meds  Medication Sig  . metoprolol tartrate (LOPRESSOR) 25 MG tablet Take 0.5 tablets (12.5 mg total) by mouth daily as needed (rapid heart rate/ high blood pressure). 1/4 tablet as needed  . omeprazole (PRILOSEC) 20 MG capsule 1 cap by mouth once daily 1/2 hour before meals    Allergies  Allergen Reactions  . Lisinopril Swelling     facial angioedema     Review of Systems     Objective:   Physical Exam NAD HEENT: PERRL, EOMI, throat without injection.  MMM Neck:  Supple No adenopathy Chest:  CTA CV:  RRR without murmur or rub, radial and DP pulses normal and equal. Abd:  S, diffuse epigastric fullness and tenderness, more prounounced in RUQ than mid or left epigastrium.  + BS.  No obvious HSM or defined mass. LE:  No edema.       Assessment & Plan:  1.  Complex Pseudocyst:  Plan for intervention with [facility].    2.  Alcoholism:  Very concerned she will not pursue treatment. Discussed importance of quitting so pseudocyst /pancreatitis does not recur.   Continues counseling with [employer] [facility] [place].  3.  COPD with Tobacco Abuse:  Changes of COPD on CT scan.  Discussed modes of support for tobacco cessation.  Again, does  not seem ready to pursue tobacco cessation at this time. Would like her to focus more on discontinuing alcohol  4.  Macrocytosis:  Have asked to restart Vitamin B12 with repeat CBC in 2 months.  5.  Palpitations:  Continue Metoprolol.  Again, using minimal amount only with symptoms.

## 2018-05-26 ENCOUNTER — Encounter (HOSPITAL_COMMUNITY): Payer: Self-pay | Admitting: *Deleted

## 2018-05-26 ENCOUNTER — Emergency Department (HOSPITAL_COMMUNITY): Payer: Self-pay

## 2018-05-26 ENCOUNTER — Inpatient Hospital Stay (HOSPITAL_COMMUNITY)
Admission: EM | Admit: 2018-05-26 | Discharge: 2018-05-30 | DRG: 872 | Disposition: A | Discharge status: Home / Self Care | Payer: Self-pay | Attending: Internal Medicine | Admitting: Internal Medicine

## 2018-05-26 DIAGNOSIS — A419 Sepsis, unspecified organism: Principal | ICD-10-CM | POA: Diagnosis present

## 2018-05-26 DIAGNOSIS — D518 Other vitamin B12 deficiency anemias: Secondary | ICD-10-CM | POA: Diagnosis present

## 2018-05-26 DIAGNOSIS — I10 Essential (primary) hypertension: Secondary | ICD-10-CM | POA: Diagnosis present

## 2018-05-26 DIAGNOSIS — Z79899 Other long term (current) drug therapy: Secondary | ICD-10-CM

## 2018-05-26 DIAGNOSIS — K861 Other chronic pancreatitis: Secondary | ICD-10-CM | POA: Diagnosis present

## 2018-05-26 DIAGNOSIS — F17200 Nicotine dependence, unspecified, uncomplicated: Secondary | ICD-10-CM | POA: Diagnosis present

## 2018-05-26 DIAGNOSIS — K860 Alcohol-induced chronic pancreatitis: Secondary | ICD-10-CM | POA: Diagnosis present

## 2018-05-26 DIAGNOSIS — D638 Anemia in other chronic diseases classified elsewhere: Secondary | ICD-10-CM | POA: Diagnosis present

## 2018-05-26 DIAGNOSIS — F17210 Nicotine dependence, cigarettes, uncomplicated: Secondary | ICD-10-CM | POA: Diagnosis present

## 2018-05-26 DIAGNOSIS — K863 Pseudocyst of pancreas: Secondary | ICD-10-CM | POA: Diagnosis present

## 2018-05-26 DIAGNOSIS — E538 Deficiency of other specified B group vitamins: Secondary | ICD-10-CM | POA: Diagnosis present

## 2018-05-26 LAB — URINALYSIS, ROUTINE W REFLEX MICROSCOPIC
Bilirubin Urine: NEGATIVE
Glucose, UA: NEGATIVE mg/dL
HGB URINE DIPSTICK: NEGATIVE
Ketones, ur: 20 mg/dL — AB
Leukocytes, UA: NEGATIVE
Nitrite: NEGATIVE
Protein, ur: 100 mg/dL — AB
SPECIFIC GRAVITY, URINE: 1.024 (ref 1.005–1.030)
pH: 6 (ref 5.0–8.0)

## 2018-05-26 LAB — CBC
HEMATOCRIT: 30.1 % — AB (ref 36.0–46.0)
Hemoglobin: 9.7 g/dL — ABNORMAL LOW (ref 12.0–15.0)
MCH: 32.3 pg (ref 26.0–34.0)
MCHC: 32.2 g/dL (ref 30.0–36.0)
MCV: 100.3 fL — AB (ref 78.0–100.0)
Platelets: 570 10*3/uL — ABNORMAL HIGH (ref 150–400)
RBC: 3 MIL/uL — ABNORMAL LOW (ref 3.87–5.11)
RDW: 14.5 % (ref 11.5–15.5)
WBC: 12.7 10*3/uL — ABNORMAL HIGH (ref 4.0–10.5)

## 2018-05-26 LAB — COMPREHENSIVE METABOLIC PANEL
ALBUMIN: 2.7 g/dL — AB (ref 3.5–5.0)
ALT: 20 U/L (ref 0–44)
AST: 31 U/L (ref 15–41)
Alkaline Phosphatase: 100 U/L (ref 38–126)
Anion gap: 15 (ref 5–15)
BUN: 21 mg/dL — AB (ref 6–20)
CHLORIDE: 97 mmol/L — AB (ref 98–111)
CO2: 25 mmol/L (ref 22–32)
Calcium: 9.4 mg/dL (ref 8.9–10.3)
Creatinine, Ser: 0.57 mg/dL (ref 0.44–1.00)
GFR calc Af Amer: >60 mL/min (ref >60)
GFR calc non Af Amer: >60 mL/min (ref >60)
GLUCOSE: 150 mg/dL — AB (ref 70–99)
POTASSIUM: 4.6 mmol/L (ref 3.5–5.1)
Sodium: 137 mmol/L (ref 135–145)
Total Bilirubin: 1.5 mg/dL — ABNORMAL HIGH (ref 0.3–1.2)
Total Protein: 8.4 g/dL — ABNORMAL HIGH (ref 6.5–8.1)

## 2018-05-26 LAB — LIPASE, BLOOD: LIPASE: 29 U/L (ref 11–51)

## 2018-05-26 LAB — I-STAT CG4 LACTIC ACID, ED: LACTIC ACID, VENOUS: 2.24 mmol/L — AB (ref 0.5–1.9)

## 2018-05-26 MED ORDER — SODIUM CHLORIDE 0.9 % IV BOLUS
500.0000 mL | Freq: Once | INTRAVENOUS | Status: AC
  Administered 2018-05-26: 500 mL via INTRAVENOUS

## 2018-05-26 MED ORDER — ONDANSETRON HCL 4 MG PO TABS: 4.0000 mg | ORAL_TABLET | Freq: Four times a day (QID) | ORAL | Status: DC | PRN

## 2018-05-26 MED ORDER — ONDANSETRON HCL 4 MG/2ML IJ SOLN
4.0000 mg | Freq: Four times a day (QID) | INTRAMUSCULAR | Status: DC | PRN
  Administered 2018-05-28: 4 mg via INTRAVENOUS
  Filled 2018-05-26 (×2): qty 2

## 2018-05-26 MED ORDER — PIPERACILLIN-TAZOBACTAM 3.375 G IVPB 30 MIN
3.3750 g | Freq: Once | INTRAVENOUS | Status: AC
  Administered 2018-05-26: 3.375 g via INTRAVENOUS
  Filled 2018-05-26: qty 50

## 2018-05-26 MED ORDER — SODIUM CHLORIDE 0.9 % IV BOLUS
1000.0000 mL | Freq: Once | INTRAVENOUS | Status: AC
  Administered 2018-05-26: 1000 mL via INTRAVENOUS

## 2018-05-26 MED ORDER — HYDROMORPHONE HCL 1 MG/ML IJ SOLN
2.0000 mg | INTRAMUSCULAR | Status: DC | PRN
  Administered 2018-05-27: 1 mg via INTRAVENOUS
  Filled 2018-05-26 (×2): qty 2

## 2018-05-26 MED ORDER — HEPARIN SODIUM (PORCINE) 5000 UNIT/ML IJ SOLN
5000.0000 [IU] | Freq: Three times a day (TID) | INTRAMUSCULAR | Status: DC
  Administered 2018-05-27: 5000 [IU] via SUBCUTANEOUS
  Filled 2018-05-26: qty 1

## 2018-05-26 MED ORDER — DEXTROSE-NACL 5-0.9 % IV SOLN
INTRAVENOUS | Status: DC
  Administered 2018-05-26 – 2018-05-27 (×3): 125 mL/h via INTRAVENOUS
  Administered 2018-05-28: 1000 mL via INTRAVENOUS
  Administered 2018-05-28: 125 mL/h via INTRAVENOUS
  Administered 2018-05-29: 1000 mL via INTRAVENOUS
  Administered 2018-05-29 – 2018-05-30 (×2): via INTRAVENOUS

## 2018-05-26 MED ORDER — IOPAMIDOL (ISOVUE-300) INJECTION 61%
100.0000 mL | Freq: Once | INTRAVENOUS | Status: AC | PRN
  Administered 2018-05-26: 100 mL via INTRAVENOUS

## 2018-05-26 MED ORDER — METOPROLOL TARTRATE 25 MG PO TABS: 6.2500 mg | ORAL_TABLET | Freq: Every day | ORAL | Status: DC | PRN

## 2018-05-26 MED ORDER — FENTANYL CITRATE (PF) 100 MCG/2ML IJ SOLN
25.0000 ug | Freq: Once | INTRAMUSCULAR | Status: AC
  Administered 2018-05-26: 25 ug via INTRAVENOUS
  Filled 2018-05-26: qty 2

## 2018-05-26 MED ORDER — MORPHINE SULFATE (PF) 2 MG/ML IV SOLN
2.0000 mg | Freq: Once | INTRAVENOUS | Status: AC
  Administered 2018-05-26: 2 mg via INTRAVENOUS
  Filled 2018-05-26: qty 1

## 2018-05-26 NOTE — ED Notes (Signed)
Transport contacted to take the patient up to floor. Patient will be taken up when fire alarm is deactivated.

## 2018-05-26 NOTE — H&P (Signed)
History and Physical    Nichole [name]:[id number] DOB: [birth year] DOA: 05/26/2018  Referring MD/NP/PA: Dr Benjiman Core  PCP: Julieanne Manson, MD   Outpatient Specialists:  Dr. Corliss Parish, [facility] gastroenterology [phone]  Patient coming from: Home  Chief Complaint: Abdominal pain  HPI: Nichole [name] is a 57 y.o. female with medical history significant of Pancreatic pseudocyst S/P drainage and stent placement 2 months ago, Hx of HTN also who presents with 2 day history of progressive abdominal pain, nausea and vomiting.  Pain is 8 out of 10 radiating to her back in the epigastric region.  It is similar to the pain she has had previously.  Denied any diarrhea.  Denied any hematemesis or melena.  Patient was seen in the ER and had a consultation done to have a gastroenterologist at [facility] who recommended admissions here for acute pancreatitis with pseudocyst.  They could will arrange possible transfer tomorrow to inpatient care at [facility] for possible procedures.  She was found to have a large pseudocyst.  ED Course: Patient's vitals showed a temperature of 98 3 blood pressure 126/77 with pulse 119 and respiratory rate of 18.  Sodium is 137 glucose 150.  Total protein is 8.4 with albumin 1.5.  White count 12.7 hemoglobin 9.7 and platelet 570.  Urinalysis is negative and lactic acid 2.24.  Lipase is 29.  CT abdomen pelvis showed a large chronic pancreatic pseudocyst in the posterior pancreatic head region extending superiorly into the right paraesophageal lower mediastinum.  This has increased in size since 2017 and new internal gas with increased wall thickening.  Findings indicate fistulization to the GI tract with associated infection of the C disease.  Also showed chronic pancreatitis with increased circumferential wall thickening in the lower thoracic esophagus.  Review of Systems: As per HPI otherwise 10 point review of systems negative.    Past Medical History:  Diagnosis Date    . Acute pancreatitis 12/05/2010, 01/2016   with complex pseudocyst, attributed to ETOH.  2017: pseudocyst, acute on chronic pancreatitis. pancreatic calcifications.  . ANEMIA, B12 DEFICIENCY 04/05/2009   macrocytic  . EXCESSIVE MENSTRUAL BLEEDING 03/18/2010   uterine fibroids. gyn eval and bx neg  . Hematemesis 01/2016   in setting pancreatitispseudocyst.  . HYPERTENSION 01/29/2008  . LEG PAIN, BILATERAL 06/22/2008  . Palpitations 01/02/2009   States heart rate has been up to 180, though states has never been seen when having fast heart rate.  Marland Kitchen PERIMENOPAUSAL SYNDROME 08/23/2009  . RASH 12/23/2010  . 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 SYNCOPE 01/02/2009  . THYROID FUNCTION TEST, ABNORMAL 03/19/2009  . TOBACCO USE 01/25/2008  . WEIGHT LOSS-ABNORMAL 12/05/2010    Past Surgical History:  Procedure Laterality Date  . ESOPHAGOGASTRODUODENOSCOPY (EGD) WITH PROPOFOL N/A 02/28/2016   Procedure: ESOPHAGOGASTRODUODENOSCOPY (EGD) WITH PROPOFOL;  Surgeon: Napoleon Form, MD;  Location: [facility] ENDOSCOPY;  Service: Endoscopy;  Laterality: N/A;  . KNEE SURGERY Left 1998   Arthroscopic--for torn meniscus     reports that she has been smoking cigarettes.  She has a 18.00 pack-year smoking history. She has never used smokeless tobacco. She reports that she drinks alcohol. She reports that she does not use drugs.  Allergies  Allergen Reactions  . Lisinopril Swelling     facial angioedema    Family History  Problem Relation Age of Onset  . Hypertension Mother   . Kidney disease Mother        removed  for cyst--not clear if this was a malignancy or not  . Other Mother        overweight--lost a lot following diagnosis of diabetes  . Diabetes Mother   . Heart attack Father   . Stroke Father        died age 19 yo--had multiple TIAs  . Muscular dystrophy Father   . Hypertension Brother   . Neuromuscular disorder Unknown        spinomuscular atrophy    Prior to  Admission medications   Medication Sig Start Date End Date Taking? Authorizing Provider  metoprolol tartrate (LOPRESSOR) 25 MG tablet Take 0.5 tablets (12.5 mg total) by mouth daily as needed (rapid heart rate/ high blood pressure). 1/4 tablet as needed 01/19/18  Yes Julieanne Manson, MD  HYDROcodone-acetaminophen (NORCO/VICODIN) 5-325 MG tablet 1-2 tabs by mouth every 4 to 6 hours as needed for severe pain Patient not taking: Reported on 05/26/2018 11/27/17   Julieanne Manson, MD  omeprazole (PRILOSEC) 20 MG capsule 1 cap by mouth once daily 1/2 hour before meals Patient not taking: Reported on 05/26/2018 01/19/18   Julieanne Manson, MD  ondansetron (ZOFRAN ODT) 4 MG disintegrating tablet Take 1 tablet (4 mg total) by mouth every 8 (eight) hours as needed for nausea or vomiting. Patient not taking: Reported on 02/26/2018 01/10/18   Georgiana Shore, PA-C  vitamin B-12 (CYANOCOBALAMIN) 1000 MCG tablet Take 1 tablet (1,000 mcg total) by mouth daily. Patient not taking: Reported on 05/26/2018 07/24/17   Julieanne Manson, MD    Physical Exam: Vitals:   05/26/18 1630 05/26/18 1730 05/26/18 1800 05/26/18 1830  BP: (!) 145/74 (!) 157/90 (!) 158/90 (!) 167/91  Pulse: (!) 105 (!) 107 (!) 108 (!) 116  Resp: 18 18 16 16   Temp:      SpO2: 99% 100% 100% 99%      Constitutional: NAD, calm, comfortable Vitals:   05/26/18 1630 05/26/18 1730 05/26/18 1800 05/26/18 1830  BP: (!) 145/74 (!) 157/90 (!) 158/90 (!) 167/91  Pulse: (!) 105 (!) 107 (!) 108 (!) 116  Resp: 18 18 16 16   Temp:      SpO2: 99% 100% 100% 99%   Eyes: PERRL, lids and conjunctivae normal ENMT: Mucous membranes are moist. Posterior pharynx clear of any exudate or lesions.Normal dentition.  Neck: normal, supple, no masses, no thyromegaly Respiratory: clear to auscultation bilaterally, no wheezing, no crackles. Normal respiratory effort. No accessory muscle use.  Cardiovascular: Regular rate and rhythm, no murmurs / rubs /  gallops. No extremity edema. 2+ pedal pulses. No carotid bruits.  Abdomen: Diffuse tenderness, no masses palpated. No hepatosplenomegaly. Bowel sounds positive.  Musculoskeletal: no clubbing / cyanosis. No joint deformity upper and lower extremities. Good ROM, no contractures. Normal muscle tone.  Skin: no rashes, lesions, ulcers. No induration Neurologic: CN 2-12 grossly intact. Sensation intact, DTR normal. Strength 5/5 in all 4.  Psychiatric: Normal judgment and insight. Alert and oriented x 3. Normal mood.   Labs on Admission: I have personally reviewed following labs and imaging studies  CBC: Recent Labs  Lab 05/26/18 1448  WBC 12.7*  HGB 9.7*  HCT 30.1*  MCV 100.3*  PLT 570*   Basic Metabolic Panel: Recent Labs  Lab 05/26/18 1448  NA 137  K 4.6  CL 97*  CO2 25  GLUCOSE 150*  BUN 21*  CREATININE 0.57  CALCIUM 9.4   GFR: CrCl cannot be calculated (Unknown ideal weight.). Liver Function Tests: Recent Labs  Lab 05/26/18 1448  AST 31  ALT 20  ALKPHOS 100  BILITOT 1.5*  PROT 8.4*  ALBUMIN 2.7*   Recent Labs  Lab 05/26/18 1448  LIPASE 29   No results for input(s): AMMONIA in the last 168 hours. Coagulation Profile: No results for input(s): INR, PROTIME in the last 168 hours. Cardiac Enzymes: No results for input(s): CKTOTAL, CKMB, CKMBINDEX, TROPONINI in the last 168 hours. BNP (last 3 results) No results for input(s): PROBNP in the last 8760 hours. HbA1C: No results for input(s): HGBA1C in the last 72 hours. CBG: No results for input(s): GLUCAP in the last 168 hours. Lipid Profile: No results for input(s): CHOL, HDL, LDLCALC, TRIG, CHOLHDL, LDLDIRECT in the last 72 hours. Thyroid Function Tests: No results for input(s): TSH, T4TOTAL, FREET4, T3FREE, THYROIDAB in the last 72 hours. Anemia Panel: No results for input(s): VITAMINB12, FOLATE, FERRITIN, TIBC, IRON, RETICCTPCT in the last 72 hours. Urine analysis:    Component Value Date/Time    COLORURINE AMBER (A) 05/26/2018 1716   APPEARANCEUR HAZY (A) 05/26/2018 1716   LABSPEC 1.024 05/26/2018 1716   PHURINE 6.0 05/26/2018 1716   GLUCOSEU NEGATIVE 05/26/2018 1716   HGBUR NEGATIVE 05/26/2018 1716   HGBUR 1+ 12/03/2010 1444   BILIRUBINUR NEGATIVE 05/26/2018 1716   BILIRUBINUR n 02/21/2011   KETONESUR 20 (A) 05/26/2018 1716   PROTEINUR 100 (A) 05/26/2018 1716   UROBILINOGEN 0.2 02/21/2011   UROBILINOGEN 1.0 12/03/2010 1444   NITRITE NEGATIVE 05/26/2018 1716   LEUKOCYTESUR NEGATIVE 05/26/2018 1716   Sepsis Labs: @LABRCNTIP (procalcitonin:4,lacticidven:4) )No results found for this or any previous visit (from the past 240 hour(s)).   Radiological Exams on Admission: Ct Abdomen Pelvis W Contrast  Result Date: 05/26/2018 CLINICAL DATA:  History of chronic pancreatitis and large pancreatic pseudocyst. Epigastric abdominal pain. EXAM: CT ABDOMEN AND PELVIS WITH CONTRAST TECHNIQUE: Multidetector CT imaging of the abdomen and pelvis was performed using the standard protocol following bolus administration of intravenous contrast. CONTRAST:  100mL ISOVUE-300 IOPAMIDOL (ISOVUE-300) INJECTION 61% COMPARISON:  01/10/2018 chest CT.  07/29/2016 CT abdomen/pelvis. FINDINGS: Lower chest: No significant pulmonary nodules or acute consolidative airspace disease. There is circumferential wall thickening in the lower thoracic esophagus, increased from prior chest CT. There is an irregular gas and fluid containing mass in the right lower paraesophageal mediastinum measuring 7.7 x 5.3 cm in axial dimensions (series 2/image 4), previously 10.3 x 5.3 cm on 01/10/2018 chest CT, demonstrating new wall thickening and hyperenhancement and new internal gas, potentially fistulized to the lower thoracic esophagus on series 2/image 2. This mass is directly contiguous with the posterior pancreatic head mass. Hepatobiliary: Normal liver size. No liver mass. Normal gallbladder with no radiopaque cholelithiasis. No  significant intrahepatic biliary ductal dilatation. CBD diameter 6 mm, top-normal. Pancreas: Coarse calcifications and parenchymal atrophy throughout the pancreas with irregular pancreatic duct dilation up to 5 mm diameter, compatible with chronic pancreatitis, unchanged from prior CT studies. There is a 7.5 x 5.9 cm mass in the posterior pancreatic head (series 2/image 25), significantly increased from 6.1 x 4.5 cm on 02/27/2016 CT abdomen study, with increasing wall thickness and wall hyperenhancement and with new internal gas, directly contiguous with the lower right paraesophageal mediastinal mass. The superior extent of this mass is not completely delineated on this CT, with a craniocaudal dimension at least 17.4 cm. Spleen: Normal size. No mass. Adrenals/Urinary Tract: Normal adrenals. Normal kidneys with no hydronephrosis and no renal mass. Normal bladder. Stomach/Bowel: Normal non-distended stomach. Normal caliber small bowel with no small bowel wall thickening.  Normal appendix. Normal large bowel with no diverticulosis, large bowel wall thickening or pericolonic fat stranding. Vascular/Lymphatic: Atherosclerotic nonaneurysmal abdominal aorta. Patent portal, splenic, hepatic and renal veins. No pathologically enlarged lymph nodes in the abdomen or pelvis. Reproductive: Grossly normal uterus.  No adnexal mass. Other: No pneumoperitoneum.  No ascites. Musculoskeletal: No aggressive appearing focal osseous lesions. IMPRESSION: 1. 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 Please note that the superior  extent of this pseudocyst is not delineated on this scan, and a dedicated chest CT with IV contrast may be considered for complete evaluation. 2. Chronic pancreatitis. 3. Increased circumferential wall thickening in the lower thoracic esophagus, nonspecific, probably inflammatory. Consider upper endoscopic correlation. 4.  Aortic Atherosclerosis (ICD10-I70.0). Electronically Signed   By: Delbert Phenix M.D.   On: 05/26/2018 19:06    Assessment/Plan Principal Problem:   Alcohol-induced chronic pancreatitis (HCC) Active Problems:   ANEMIA, B12 DEFICIENCY   TOBACCO USE   Essential hypertension   Pseudocyst of pancreas   Chronic pancreatitis (HCC)    #1 pancreatic pseudocyst: Appears chronic with some acute symptoms.  Patient is to be admitted for pain management as well as supportive care.  We will place her on clear liquids only.  Discussion with her gastroenterologist at [facility] with possible transfer to be arranged by Dr. Jamse Mead in the morning.  #2 chronic pancreatitis: Again continue per GI with supportive care only and pain management.  #3 anemia of chronic disease: Patient also has B12 deficiency.  H&H appears to be close to baseline.  No evidence of acute bleed.  Monitor H&H and continue supplementation.  #4 history of alcohol abuse: Patient has since stopped alcohol intake.  At this point she is for the most part stable.  Continue current management.  #5 hypertension: Blood pressure appears controlled.  Monitor closely and continue home regimen  #6 tobacco abuse: Counseling provided.  Nicotine patch offered   DVT prophylaxis: Heparin  Code Status: Full code  Family Communication: None at bedside  Disposition Plan: Home  Consults called: Dr Matthias Hughs, GI  Admission status: inpatient  Severity of Illness: The appropriate patient status for this patient is INPATIENT. Inpatient status is judged to be reasonable and necessary in order to provide the required intensity of service to ensure  the patient's safety. The patient's presenting symptoms, physical exam findings, and initial radiographic and laboratory data in the context of their chronic comorbidities is felt to place them at high risk for further clinical deterioration. Furthermore, it is not anticipated that the patient will be medically stable for discharge from the hospital within 2 midnights of admission. The following factors support the patient status of inpatient.   " The patient's presenting symptoms include Abdominal pain. " The worrisome physical exam findings include Abdominal tenderness. " The initial radiographic and laboratory data are worrisome because of CT showing pseudocyst. " The chronic co-morbidities include Chronic Pancreatitis.   * I certify that at the point of admission it is my clinical judgment that the patient will require inpatient hospital care spanning beyond 2 midnights from the point of admission due to high intensity of service, high risk for further deterioration and high frequency of surveillance  required.Lonia Blood MD [facility] Pager [phone]  If 7PM-7AM, please contact night-coverage [email or web address] Password [email or web address]  05/26/2018, 8:40 PM

## 2018-05-26 NOTE — ED Notes (Signed)
ED TO INPATIENT HANDOFF REPORT  Name/Age/Gender Nichole [name] 57 y.o. female  Code Status Code Status History    Date Active Date Inactive Code Status Order ID Comments User Context   02/27/2016 1730 02/29/2016 1739 Full Code [id number]  Cristal Ford, DO Inpatient      Home/SNF/Other Home  Chief Complaint Abd Pain; Tachy  Level of Care/Admitting Diagnosis ED Disposition    ED Disposition Condition Comment   Admit  Hospital Area: [facility] [[id number]]  Level of Care: Med-Surg [16]  Diagnosis: Chronic pancreatitis ([place]) [577.1.ICD-9-CM]  Admitting Physician: Elwyn Reach [[id number]]  Attending Physician: Elwyn Reach [[id number]]  Estimated length of stay: past midnight tomorrow  Certification:: I certify this patient will need inpatient services for at least 2 midnights  PT Class (Do Not Modify): Inpatient [101]  PT Acc Code (Do Not Modify): Private [1]       Medical History Past Medical History:  Diagnosis Date  . Acute pancreatitis 12/05/2010, 01/2016   with complex pseudocyst, attributed to ETOH.  2017: pseudocyst, acute on chronic pancreatitis. pancreatic calcifications.  . ANEMIA, B12 DEFICIENCY 04/05/2009   macrocytic  . EXCESSIVE MENSTRUAL BLEEDING 03/18/2010   uterine fibroids. gyn eval and bx neg  . Hematemesis 01/2016   in setting pancreatitispseudocyst.  . HYPERTENSION 01/29/2008  . LEG PAIN, BILATERAL 06/22/2008  . Palpitations 01/02/2009   States heart rate has been up to 180, though states has never been seen when having fast heart rate.  Marland Kitchen PERIMENOPAUSAL SYNDROME 08/23/2009  . RASH 12/23/2010  . 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 SYNCOPE 01/02/2009  . THYROID FUNCTION TEST, ABNORMAL 03/19/2009  . TOBACCO USE 01/25/2008  . WEIGHT LOSS-ABNORMAL 12/05/2010    Allergies Allergies  Allergen Reactions  . Lisinopril Swelling     facial angioedema    IV Location/Drains/Wounds Patient  Lines/Drains/Airways Status   Active Line/Drains/Airways    Name:   Placement date:   Placement time:   Site:   Days:   Peripheral IV 05/26/18 Right Antecubital   05/26/18    1447    Antecubital   less than 1   Airway   02/28/16    1201     818          Labs/Imaging Results for orders placed or performed during the hospital encounter of 05/26/18 (from the past 48 hour(s))  Lipase, blood     Status: None   Collection Time: 05/26/18  2:48 PM  Result Value Ref Range   Lipase 29 11 - 51 U/L    Comment: Performed at [facility], [place] [address], [place], [place] [address]  Comprehensive metabolic panel     Status: Abnormal   Collection Time: 05/26/18  2:48 PM  Result Value Ref Range   Sodium 137 135 - 145 mmol/L   Potassium 4.6 3.5 - 5.1 mmol/L   Chloride 97 (L) 98 - 111 mmol/L    Comment: Please note change in reference range.   CO2 25 22 - 32 mmol/L   Glucose, Bld 150 (H) 70 - 99 mg/dL    Comment: Please note change in reference range.   BUN 21 (H) 6 - 20 mg/dL    Comment: Please note change in reference range.   Creatinine, Ser 0.57 0.44 - 1.00 mg/dL   Calcium 9.4 8.9 - 10.3 mg/dL   Total Protein 8.4 (H) 6.5 - 8.1 g/dL   Albumin 2.7 (L) 3.5 -  5.0 g/dL   AST 31 15 - 41 U/L   ALT 20 0 - 44 U/L    Comment: Please note change in reference range.   Alkaline Phosphatase 100 38 - 126 U/L   Total Bilirubin 1.5 (H) 0.3 - 1.2 mg/dL   GFR calc non Af Amer >60 >60 mL/min   GFR calc Af Amer >60 >60 mL/min    Comment: (NOTE) The eGFR has been calculated using the CKD EPI equation. This calculation has not been validated in all clinical situations. eGFR's persistently <60 mL/min signify possible Chronic Kidney Disease.    Anion gap 15 5 - 15    Comment: Performed at [facility], [place] [address] [place], [place] [address]  CBC     Status: Abnormal   Collection Time: 05/26/18  2:48 PM  Result Value Ref Range   WBC 12.7 (H) 4.0 - 10.5 K/uL   RBC  3.00 (L) 3.87 - 5.11 MIL/uL   Hemoglobin 9.7 (L) 12.0 - 15.0 g/dL   HCT 30.1 (L) 36.0 - 46.0 %   MCV 100.3 (H) 78.0 - 100.0 fL   MCH 32.3 26.0 - 34.0 pg   MCHC 32.2 30.0 - 36.0 g/dL   RDW 14.5 11.5 - 15.5 %   Platelets 570 (H) 150 - 400 K/uL    Comment: Performed at [facility], [place] [address], [place], [place] [address]  Urinalysis, Routine w reflex microscopic     Status: Abnormal   Collection Time: 05/26/18  5:16 PM  Result Value Ref Range   Color, Urine AMBER (A) YELLOW    Comment: BIOCHEMICALS MAY BE AFFECTED BY COLOR   APPearance HAZY (A) CLEAR   Specific Gravity, Urine 1.024 1.005 - 1.030   pH 6.0 5.0 - 8.0   Glucose, UA NEGATIVE NEGATIVE mg/dL   Hgb urine dipstick NEGATIVE NEGATIVE   Bilirubin Urine NEGATIVE NEGATIVE   Ketones, ur 20 (A) NEGATIVE mg/dL   Protein, ur 100 (A) NEGATIVE mg/dL   Nitrite NEGATIVE NEGATIVE   Leukocytes, UA NEGATIVE NEGATIVE   RBC / HPF 0-5 0 - 5 RBC/hpf   WBC, UA 0-5 0 - 5 WBC/hpf   Bacteria, UA FEW (A) NONE SEEN   Squamous Epithelial / LPF 0-5 0 - 5   Mucus PRESENT     Comment: Performed at [facility], [place] [address] [place], [place] [address]  I-Stat CG4 Lactic Acid, ED     Status: Abnormal   Collection Time: 05/26/18  7:58 PM  Result Value Ref Range   Lactic Acid, Venous 2.24 (HH) 0.5 - 1.9 mmol/L   Comment NOTIFIED PHYSICIAN    Ct Abdomen Pelvis W Contrast  Result Date: 05/26/2018 CLINICAL DATA:  History of chronic pancreatitis and large pancreatic pseudocyst. Epigastric abdominal pain. EXAM: CT ABDOMEN AND PELVIS WITH CONTRAST TECHNIQUE: Multidetector CT imaging of the abdomen and pelvis was performed using the standard protocol following bolus administration of intravenous contrast. CONTRAST:  153m ISOVUE-300 IOPAMIDOL (ISOVUE-300) INJECTION 61% COMPARISON:  01/10/2018 chest CT.  07/29/2016 CT abdomen/pelvis. FINDINGS: Lower chest: No significant pulmonary nodules or acute consolidative airspace  disease. There is circumferential wall thickening in the lower thoracic esophagus, increased from prior chest CT. There is an irregular gas and fluid containing mass in the right lower paraesophageal mediastinum measuring 7.7 x 5.3 cm in axial dimensions (series 2/image 4), previously 10.3 x 5.3 cm on 01/10/2018 chest CT, demonstrating new wall thickening and hyperenhancement and new internal gas, potentially fistulized  to the lower thoracic esophagus on series 2/image 2. This mass is directly contiguous with the posterior pancreatic head mass. Hepatobiliary: Normal liver size. No liver mass. Normal gallbladder with no radiopaque cholelithiasis. No significant intrahepatic biliary ductal dilatation. CBD diameter 6 mm, top-normal. Pancreas: Coarse calcifications and parenchymal atrophy throughout the pancreas with irregular pancreatic duct dilation up to 5 mm diameter, compatible with chronic pancreatitis, unchanged from prior CT studies. There is a 7.5 x 5.9 cm mass in the posterior pancreatic head (series 2/image 25), significantly increased from 6.1 x 4.5 cm on 02/27/2016 CT abdomen study, with increasing wall thickness and wall hyperenhancement and with new internal gas, directly contiguous with the lower right paraesophageal mediastinal mass. The superior extent of this mass is not completely delineated on this CT, with a craniocaudal dimension at least 17.4 cm. Spleen: Normal size. No mass. Adrenals/Urinary Tract: Normal adrenals. Normal kidneys with no hydronephrosis and no renal mass. Normal bladder. Stomach/Bowel: Normal non-distended stomach. Normal caliber small bowel with no small bowel wall thickening. Normal appendix. Normal large bowel with no diverticulosis, large bowel wall thickening or pericolonic fat stranding. Vascular/Lymphatic: Atherosclerotic nonaneurysmal abdominal aorta. Patent portal, splenic, hepatic and renal veins. No pathologically enlarged lymph nodes in the abdomen or pelvis.  Reproductive: Grossly normal uterus.  No adnexal mass. Other: No pneumoperitoneum.  No ascites. Musculoskeletal: No aggressive appearing focal osseous lesions. IMPRESSION: 1. 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 Please note that the superior extent of this pseudocyst is not delineated on this scan, and a dedicated chest CT with IV contrast may be considered for complete evaluation. 2. Chronic pancreatitis. 3. Increased circumferential wall thickening in the lower thoracic esophagus, nonspecific, probably inflammatory. Consider upper endoscopic correlation. 4.  Aortic Atherosclerosis (ICD10-I70.0). Electronically Signed   By: Ilona Sorrel M.D.   On: 05/26/2018 19:06    Pending Labs [employer] (From admission, onward)   Start     Ordered   Signed and Held  HIV antibody (Routine Testing)  Once,   R     Signed and Held   Signed and Held  CBC  (heparin)  Once,   R    Comments:  Baseline for heparin therapy IF NOT ALREADY DRAWN.  Notify MD if PLT < 100 K.    Signed and Held   Signed and Held  Creatinine, serum  (heparin)  Once,   R    Comments:  Baseline for heparin therapy IF NOT ALREADY DRAWN.    Signed and Held   Signed and Held  Comprehensive metabolic panel  Tomorrow morning,   R     Signed and Held   Signed and Held  CBC  Tomorrow morning,   R     Signed and Held      Vitals/Pain Today's Vitals   05/26/18 1730 05/26/18 1800 05/26/18 1830 05/26/18 1930  BP: (!) 157/90 (!) 158/90 (!) 167/91 (!) 172/96  Pulse: (!) 107 (!) 108 (!) 116 (!) 125  Resp: _0 Temp:       SpO2: 100% 100% 99% 100%    Isolation Precautions No active isolations  Medications Medications  sodium chloride 0.9 % bolus  1,000 mL (0 mLs Intravenous Stopped 05/26/18 1717)  fentaNYL (SUBLIMAZE) injection 25 mcg (25 mcg Intravenous Given 05/26/18 1548)  sodium chloride 0.9 % bolus 500 mL (0 mLs Intravenous Stopped 05/26/18 1818)  morphine 2 MG/ML injection 2 mg (2 mg Intravenous Given 05/26/18 1717)  iopamidol (ISOVUE-300) 61 % injection 100 mL (100 mLs Intravenous Contrast Given 05/26/18 1805)  sodium chloride 0.9 % bolus 500 mL (0 mLs Intravenous Stopped 05/26/18 2101)  piperacillin-tazobactam (ZOSYN) IVPB 3.375 g (0 g Intravenous Stopped 05/26/18 2117)    Mobility walks

## 2018-05-26 NOTE — ED Notes (Signed)
Pt. I-stat CG4 lactic acid results 2.24, PA,Hina made aware and RN,Natalie.

## 2018-05-26 NOTE — ED Notes (Signed)
Bed: [id number] Expected date:  Expected time:  Means of arrival:  Comments: EMS/abd. Pain/tach

## 2018-05-26 NOTE — ED Triage Notes (Signed)
Per EMS pt coming from home with c/o low appetite x 2 weeks, since Monday developed generalized abdominal pain as well as mid back pain, nausea and chills.

## 2018-05-26 NOTE — ED Notes (Signed)
[place] Nurse stated she would call back.

## 2018-05-26 NOTE — ED Provider Notes (Signed)
[facility]-EMERGENCY DEPT Provider Note   CSN: [id number] Arrival date & time: 05/26/18  1415     History   Chief Complaint Chief Complaint  Patient presents with  . Abdominal Pain  . Back Pain    HPI Nichole [name] is a 57 y.o. female with a past medical history of hypertension, pancreatic pseudocyst s/p drainage and stent placement last month, who presents to ED for evaluation of 2-week history of decreased appetite, generalized weakness.  She also reports a 2-day history of generalized abdominal pain radiating to her mid back, nausea.  Denies any vomiting, diarrhea or constipation.  Last bowel movement was this morning and it was normal.  Denies any sick contacts with similar symptoms.  She has not tried taking any medicine try to help with her symptoms.  Denies any dysuria, hematuria, vaginal complaints, chest pain, shortness of breath, fever.  She denies any alcohol, tobacco or other drug use.  HPI  Past Medical History:  Diagnosis Date  . Acute pancreatitis 12/05/2010, 01/2016   with complex pseudocyst, attributed to ETOH.  2017: pseudocyst, acute on chronic pancreatitis. pancreatic calcifications.  . ANEMIA, B12 DEFICIENCY 04/05/2009   macrocytic  . EXCESSIVE MENSTRUAL BLEEDING 03/18/2010   uterine fibroids. gyn eval and bx neg  . Hematemesis 01/2016   in setting pancreatitispseudocyst.  . HYPERTENSION 01/29/2008  . LEG PAIN, BILATERAL 06/22/2008  . Palpitations 01/02/2009   States heart rate has been up to 180, though states has never been seen when having fast heart rate.  Marland Kitchen PERIMENOPAUSAL SYNDROME 08/23/2009  . RASH 12/23/2010  . 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 SYNCOPE 01/02/2009  . THYROID FUNCTION TEST, ABNORMAL 03/19/2009  . TOBACCO USE 01/25/2008  . WEIGHT LOSS-ABNORMAL 12/05/2010    Patient Active Problem List   Diagnosis Date Noted  . 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 Anxiety and  depression 07/17/2017  . Pseudocyst of pancreas   . Hematemesis   . Esophageal stricture   . Acute pancreatitis 02/27/2016  . Alcohol abuse 02/27/2016  . Does not have health insurance 06/06/2014  . History of chest pain at rest 06/06/2014  . Ganglion cyst of wrist 06/06/2014  . Visit for preventive health examination 02/22/2013  . Change in bowel habits 02/22/2013  . History of positive PPD 02/22/2013  . Neuropathy 08/26/2012  . Numbness and tingling of both legs below knees 08/26/2012  . Anterior leg pain 05/13/2012  . Edema 05/13/2012  . Pseudocyst, pancreas 02/22/2011  . GASTROINTESTINAL XRAY, ABNORMAL 12/16/2010  . Weight loss 12/05/2010  . Abdominal pain 12/03/2010  . SWEATING 09/23/2010  . Deficiency anemia 08/23/2009  . PERIMENOPAUSAL SYNDROME 08/23/2009  . ANEMIA, B12 DEFICIENCY 04/05/2009  . THYROID FUNCTION TEST, ABNORMAL 03/19/2009  . SYNCOPE 01/02/2009  . PALPITATIONS 01/02/2009  . LEG PAIN, BILATERAL 06/22/2008  . UNSPECIFIED ANEMIA 02/14/2008  . Essential hypertension 01/29/2008  . TOBACCO USE 01/25/2008    Past Surgical History:  Procedure Laterality Date  . ESOPHAGOGASTRODUODENOSCOPY (EGD) WITH PROPOFOL N/A 02/28/2016   Procedure: ESOPHAGOGASTRODUODENOSCOPY (EGD) WITH PROPOFOL;  Surgeon: Napoleon Form, MD;  Location: [facility];  Service: Endoscopy;  Laterality: N/A;  . KNEE SURGERY Left 1998   Arthroscopic--for torn meniscus     OB History    Gravida  0   Para  0   Term  0   Preterm  0   AB  0   Living  0     SAB  0   TAB  0   Ectopic  0   Multiple  0   Live Births               Home Medications    Prior to Admission medications   Medication Sig Start Date End Date Taking? Authorizing Provider  metoprolol tartrate (LOPRESSOR) 25 MG tablet Take 0.5 tablets (12.5 mg total) by mouth daily as needed (rapid heart rate/ high blood pressure). 1/4 tablet as needed 01/19/18  Yes Julieanne Manson, MD    HYDROcodone-acetaminophen (NORCO/VICODIN) 5-325 MG tablet 1-2 tabs by mouth every 4 to 6 hours as needed for severe pain Patient not taking: Reported on 05/26/2018 11/27/17   Julieanne Manson, MD  omeprazole (PRILOSEC) 20 MG capsule 1 cap by mouth once daily 1/2 hour before meals Patient not taking: Reported on 05/26/2018 01/19/18   Julieanne Manson, MD  ondansetron (ZOFRAN ODT) 4 MG disintegrating tablet Take 1 tablet (4 mg total) by mouth every 8 (eight) hours as needed for nausea or vomiting. Patient not taking: Reported on 02/26/2018 01/10/18   Georgiana Shore, PA-C  vitamin B-12 (CYANOCOBALAMIN) 1000 MCG tablet Take 1 tablet (1,000 mcg total) by mouth daily. Patient not taking: Reported on 05/26/2018 07/24/17   Julieanne Manson, MD    Family History Family History  Problem Relation Age of Onset  . Hypertension Mother   . Kidney disease Mother        removed for cyst--not clear if this was a malignancy or not  . Other Mother        overweight--lost a lot following diagnosis of diabetes  . Diabetes Mother   . Heart attack Father   . Stroke Father        died age 14 yo--had multiple TIAs  . Muscular dystrophy Father   . Hypertension Brother   . Neuromuscular disorder Unknown        spinomuscular atrophy    Social History Social History   Tobacco Use  . Smoking status: Current Every Day Smoker    Packs/day: 0.50    Years: 36.00    Pack years: 18.00    Types: Cigarettes  . Smokeless tobacco: Never Used  . Tobacco comment: Not ready to quit.  Does not want patches or other medication support.  Not a "medication"  person.    Substance Use Topics  . Alcohol use: Yes    Comment: Began drinking a lot in mid 30s.  Gos to [employer] [place].  Has depression and anxiety.  Leonor Liv, MSW, LCSWA.  Drank alcohol again in December 2018 when snowed in.  . Drug use: No     Allergies   Lisinopril   Review of Systems Review of Systems  Constitutional:  Positive for appetite change and fatigue. Negative for chills and fever.  HENT: Negative for ear pain, rhinorrhea, sneezing and sore throat.   Eyes: Negative for photophobia and visual disturbance.  Respiratory: Negative for cough, chest tightness, shortness of breath and wheezing.   Cardiovascular: Negative for chest pain and palpitations.  Gastrointestinal: Positive for abdominal pain and nausea. Negative for blood in stool, constipation, diarrhea and vomiting.  Genitourinary: Negative for dysuria, hematuria and urgency.  Musculoskeletal: Negative for myalgias.  Skin: Negative for rash.  Neurological: Negative for dizziness, weakness and light-headedness.     Physical Exam Updated Vital Signs BP (!) 167/91   Pulse (!) 116   Temp 98.3 F (36.8 C)   Resp 16   SpO2 99%  Physical Exam  Constitutional: She appears well-developed and well-nourished. No distress.  HENT:  Head: Normocephalic and atraumatic.  Nose: Nose normal.  Eyes: Conjunctivae and EOM are normal. Left eye exhibits no discharge. No scleral icterus.  Neck: Normal range of motion. Neck supple.  Cardiovascular: Regular rhythm, normal heart sounds and intact distal pulses. Tachycardia present. Exam reveals no gallop and no friction rub.  No murmur heard. Pulmonary/Chest: Effort normal and breath sounds normal. No respiratory distress.  Abdominal: Soft. Bowel sounds are normal. She exhibits no distension. There is tenderness in the epigastric area and periumbilical area. There is CVA tenderness. There is no rebound and no guarding.  Musculoskeletal: Normal range of motion. She exhibits no edema.  Neurological: She is alert. She exhibits normal muscle tone. Coordination normal.  Skin: Skin is warm and dry. No rash noted.  Psychiatric: She has a normal mood and affect.  Nursing note and vitals reviewed.    ED Treatments / Results  Labs (all labs ordered are listed, but only abnormal results are displayed) Labs  Reviewed  COMPREHENSIVE METABOLIC PANEL - Abnormal; Notable for the following components:      Result Value   Chloride 97 (*)    Glucose, Bld 150 (*)    BUN 21 (*)    Total Protein 8.4 (*)    Albumin 2.7 (*)    Total Bilirubin 1.5 (*)    All other components within normal limits  CBC - Abnormal; Notable for the following components:   WBC 12.7 (*)    RBC 3.00 (*)    Hemoglobin 9.7 (*)    HCT 30.1 (*)    MCV 100.3 (*)    Platelets 570 (*)    All other components within normal limits  URINALYSIS, ROUTINE W REFLEX MICROSCOPIC - Abnormal; Notable for the following components:   Color, Urine AMBER (*)    APPearance HAZY (*)    Ketones, ur 20 (*)    Protein, ur 100 (*)    Bacteria, UA FEW (*)    All other components within normal limits  I-STAT CG4 LACTIC ACID, ED - Abnormal; Notable for the following components:   Lactic Acid, Venous 2.24 (*)    All other components within normal limits  LIPASE, BLOOD    EKG None  Radiology Ct Abdomen Pelvis W Contrast  Result Date: 05/26/2018 CLINICAL DATA:  History of chronic pancreatitis and large pancreatic pseudocyst. Epigastric abdominal pain. EXAM: CT ABDOMEN AND PELVIS WITH CONTRAST TECHNIQUE: Multidetector CT imaging of the abdomen and pelvis was performed using the standard protocol following bolus administration of intravenous contrast. CONTRAST:  ISOVUE-300 IOPAMIDOL (ISOVUE-300) INJECTION 61% COMPARISON:  01/10/2018 chest CT.  07/29/2016 CT abdomen/pelvis. FINDINGS: Lower chest: No significant pulmonary nodules or acute consolidative airspace disease. There is circumferential wall thickening in the lower thoracic esophagus, increased from prior chest CT. There is an irregular gas and fluid containing mass in the right lower paraesophageal mediastinum measuring 7.7 x 5.3 cm in axial dimensions (series 2/image 4), previously 10.3 x 5.3 cm on 01/10/2018 chest CT, demonstrating new wall thickening and hyperenhancement and new internal  gas, potentially fistulized to the lower thoracic esophagus on series 2/image 2. This mass is directly contiguous with the posterior pancreatic head mass. Hepatobiliary: Normal liver size. No liver mass. Normal gallbladder with no radiopaque cholelithiasis. No significant intrahepatic biliary ductal dilatation. CBD diameter 6 mm, top-normal. Pancreas: Coarse calcifications and parenchymal atrophy throughout the pancreas with irregular pancreatic duct dilation up to 5 mm diameter,  compatible with chronic pancreatitis, unchanged from prior CT studies. There is a 7.5 x 5.9 cm mass in the posterior pancreatic head (series 2/image 25), significantly increased from 6.1 x 4.5 cm on 02/27/2016 CT abdomen study, with increasing wall thickness and wall hyperenhancement and with new internal gas, directly contiguous with the lower right paraesophageal mediastinal mass. The superior extent of this mass is not completely delineated on this CT, with a craniocaudal dimension at least 17.4 cm. Spleen: Normal size. No mass. Adrenals/Urinary Tract: Normal adrenals. Normal kidneys with no hydronephrosis and no renal mass. Normal bladder. Stomach/Bowel: Normal non-distended stomach. Normal caliber small bowel with no small bowel wall thickening. Normal appendix. Normal large bowel with no diverticulosis, large bowel wall thickening or pericolonic fat stranding. Vascular/Lymphatic: Atherosclerotic nonaneurysmal abdominal aorta. Patent portal, splenic, hepatic and renal veins. No pathologically enlarged lymph nodes in the abdomen or pelvis. Reproductive: Grossly normal uterus.  No adnexal mass. Other: No pneumoperitoneum.  No ascites. Musculoskeletal: No aggressive appearing focal osseous lesions. IMPRESSION: 1. Large chronic pancreatic pseudocyst in the posterior pancreatic head region extends superiorly into the right paraesophageal lower mediastinum, has increased in size since 2017, and demonstrates new internal gas and increased  wall thickening and hyperenhancement, likely indicating fistulization to the GI tract with associated infection of the pseudocyst, with the GI tract fistulization potentially located at the level of the lower thoracic esophagus. Please note that the superior extent of this pseudocyst is not delineated on this scan, and a dedicated chest CT with IV contrast may be considered for complete evaluation. 2. Chronic pancreatitis. 3. Increased circumferential wall thickening in the lower thoracic esophagus, nonspecific, probably inflammatory. Consider upper endoscopic correlation. 4.  Aortic Atherosclerosis (ICD10-I70.0). Electronically Signed   By: Delbert PhenixJason A Poff M.D.   On: 05/26/2018 19:06    Procedures Procedures (including critical care time)  Medications Ordered in ED Medications  sodium chloride 0.9 % bolus 500 mL (has no administration in time range)  piperacillin-tazobactam (ZOSYN) IVPB 3.375 g (has no administration in time range)  sodium chloride 0.9 % bolus 1,000 mL (0 mLs Intravenous Stopped 05/26/18 1717)  fentaNYL (SUBLIMAZE) injection 25 mcg (25 mcg Intravenous Given 05/26/18 1548)  sodium chloride 0.9 % bolus 500 mL (0 mLs Intravenous Stopped 05/26/18 1818)  morphine 2 MG/ML injection 2 mg (2 mg Intravenous Given 05/26/18 1717)  iopamidol (ISOVUE-300) 61 % injection 100 mL (100 mLs Intravenous Contrast Given 05/26/18 1805)     Initial Impression / Assessment and Plan / ED Course  I have reviewed the triage vital signs and the nursing notes.  Pertinent labs & imaging results that were available during my care of the patient were reviewed by me and considered in my medical decision making (see chart for details).     57 year old female with a past medical history of hypertension, pancreatic pseudocyst diagnosed in 2018, with drainage and stent placement 2 months ago, presents for 2-day history of generalized/epigastric abdominal pain radiating to her mid back with associated nausea.  Also  reports 2-week history of decreased appetite.  Denies any bowel changes, vomiting, fevers.  Lab work significant for leukocytosis at 12.7.  Urinalysis unremarkable.  CMP unremarkable.  Lactic is elevated at 2.25.  CT of abdomen and pelvis shows large chronic pseudocyst that appears infected with gas formation possible fistulization of the esophagus.  [facility] who completed her procedure 2 months ago.  He states that he recommends admission to medicine, IV antibiotics.  There ED is  not accepting any transfers at this time.  He does recommend calling him in the morning to get a possible transfer and then.  I spoke to hospitalist who agrees to admit the patient.  Patient began on Zosyn.  I gave patient a few doses of pain medication but she has been hesitant on receiving more.  Dr. Corliss Parish, [facility] gastroenterology [phone]. Please give him a call in the morning for further management.  Portions of this note were generated with Scientist, clinical (histocompatibility and immunogenetics). Dictation errors may occur despite best attempts at proofreading.   Final Clinical Impressions(s) / ED Diagnoses   Final diagnoses:  None    ED Discharge Orders    None       Dietrich Pates, PA-C 05/26/18 2040    Tegeler, Canary Brim, MD 05/27/18 [phone]

## 2018-05-26 NOTE — ED Notes (Signed)
Patient transported to CT 

## 2018-05-27 ENCOUNTER — Other Ambulatory Visit: Payer: Self-pay

## 2018-05-27 LAB — FERRITIN: Ferritin: 1385 ng/mL — ABNORMAL HIGH (ref 11–307)

## 2018-05-27 LAB — VITAMIN B12: Vitamin B-12: 975 pg/mL — ABNORMAL HIGH (ref 180–914)

## 2018-05-27 LAB — COMPREHENSIVE METABOLIC PANEL
ALT: 16 U/L (ref 0–44)
AST: 24 U/L (ref 15–41)
Albumin: 2 g/dL — ABNORMAL LOW (ref 3.5–5.0)
Alkaline Phosphatase: 72 U/L (ref 38–126)
Anion gap: 7 (ref 5–15)
BUN: 12 mg/dL (ref 6–20)
CHLORIDE: 109 mmol/L (ref 98–111)
CO2: 22 mmol/L (ref 22–32)
Calcium: 8.1 mg/dL — ABNORMAL LOW (ref 8.9–10.3)
Creatinine, Ser: 0.57 mg/dL (ref 0.44–1.00)
Glucose, Bld: 212 mg/dL — ABNORMAL HIGH (ref 70–99)
POTASSIUM: 3.6 mmol/L (ref 3.5–5.1)
Sodium: 138 mmol/L (ref 135–145)
TOTAL PROTEIN: 6.1 g/dL — AB (ref 6.5–8.1)
Total Bilirubin: 1 mg/dL (ref 0.3–1.2)

## 2018-05-27 LAB — CBC
HEMATOCRIT: 23.6 % — AB (ref 36.0–46.0)
Hemoglobin: 7.6 g/dL — ABNORMAL LOW (ref 12.0–15.0)
MCH: 32.2 pg (ref 26.0–34.0)
MCHC: 32.2 g/dL (ref 30.0–36.0)
MCV: 100 fL (ref 78.0–100.0)
Platelets: 396 10*3/uL (ref 150–400)
RBC: 2.36 MIL/uL — AB (ref 3.87–5.11)
RDW: 14.7 % (ref 11.5–15.5)
WBC: 11.3 10*3/uL — AB (ref 4.0–10.5)

## 2018-05-27 LAB — RETICULOCYTES
RBC.: 2.14 MIL/uL — AB (ref 3.87–5.11)
RETIC COUNT ABSOLUTE: 62.1 10*3/uL (ref 19.0–186.0)
Retic Ct Pct: 2.9 % (ref 0.4–3.1)

## 2018-05-27 LAB — IRON AND TIBC
Iron: 23 ug/dL — ABNORMAL LOW (ref 28–170)
Saturation Ratios: 17 % (ref 10.4–31.8)
TIBC: 132 ug/dL — ABNORMAL LOW (ref 250–450)
UIBC: 109 ug/dL

## 2018-05-27 LAB — PREPARE RBC (CROSSMATCH)

## 2018-05-27 LAB — FOLATE: FOLATE: 9.9 ng/mL (ref >5.9)

## 2018-05-27 LAB — HEMOGLOBIN AND HEMATOCRIT, BLOOD
HEMATOCRIT: 21.6 % — AB (ref 36.0–46.0)
HEMATOCRIT: 29.7 % — AB (ref 36.0–46.0)
HEMOGLOBIN: 6.9 g/dL — AB (ref 12.0–15.0)
Hemoglobin: 10 g/dL — ABNORMAL LOW (ref 12.0–15.0)

## 2018-05-27 LAB — ABO/RH: ABO/RH(D): AB POS

## 2018-05-27 MED ORDER — SODIUM CHLORIDE 0.9% IV SOLUTION
Freq: Once | INTRAVENOUS | Status: AC
  Administered 2018-05-27: 12:00:00 via INTRAVENOUS

## 2018-05-27 MED ORDER — PIPERACILLIN-TAZOBACTAM 3.375 G IVPB
3.3750 g | Freq: Three times a day (TID) | INTRAVENOUS | Status: DC
  Administered 2018-05-27 – 2018-05-30 (×6): 3.375 g via INTRAVENOUS
  Filled 2018-05-27 (×7): qty 50

## 2018-05-27 MED ORDER — HYDROMORPHONE HCL 1 MG/ML IJ SOLN
1.0000 mg | INTRAMUSCULAR | Status: DC | PRN
  Administered 2018-05-27 – 2018-05-29 (×6): 1 mg via INTRAVENOUS
  Filled 2018-05-27 (×6): qty 1

## 2018-05-27 MED ORDER — METOPROLOL TARTRATE 5 MG/5ML IV SOLN
5.0000 mg | Freq: Four times a day (QID) | INTRAVENOUS | Status: DC
  Administered 2018-05-27 (×2): 5 mg via INTRAVENOUS
  Filled 2018-05-27 (×3): qty 5

## 2018-05-27 NOTE — Progress Notes (Signed)
Pharmacy Antibiotic Note  Nichole [name] is a 57 y.o. female PMH abdominal pseudocyst s/p drainage and stent placement 2 mo ago presents on 05/26/2018 with abdominal pain. 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 Low grade fever this AM, and Pharmacy has been consulted for Zosyn dosing. No cultures obtained as of 6/27.  Plan:  Zosyn 3.375g IV q8h (4 hour infusion).   Given stable renal function, will sign off consult - will follow peripherally for renal adjustments or culture results     Temp (24hrs), Avg:99.3 F (37.4 C), Min:98.3 F (36.8 C), Max:100.5 F (38.1 C)  Recent Labs  Lab 05/26/18 1448 05/26/18 1958 05/27/18 0416  WBC 12.7*  --  11.3*  CREATININE 0.57  --  0.57  LATICACIDVEN  --  2.24*  --     CrCl cannot be calculated (Unknown ideal weight.).    Allergies  Allergen Reactions  . Lisinopril Swelling     facial angioedema     Thank you for allowing pharmacy to be a part of this patient's care.  Bernadene Personrew Archie Shea, PharmD, BCPS [phone] 05/27/2018, 10:40 AM

## 2018-05-27 NOTE — Progress Notes (Addendum)
CRITICAL VALUE ALERT  Critical Value:  6.9 HGB  Date & Time Notied:  05/27/18, [id number]  Provider Notified: Yes  Orders Received/Actions taken: None. Will continue to monitor and await orders

## 2018-05-27 NOTE — Progress Notes (Signed)
Initial Nutrition Assessment  DOCUMENTATION CODES:   Not applicable  INTERVENTION:  - Diet advancement as medically feasible.  NUTRITION DIAGNOSIS:   Inadequate oral intake related to inability to eat as evidenced by NPO status.  GOAL:   Patient will meet greater than or equal to 90% of their needs  MONITOR:   Diet advancement, Weight trends, Labs  REASON FOR ASSESSMENT:   Malnutrition Screening Tool  ASSESSMENT:   57 y.o. female with medical history significant of Pancreatic pseudocyst S/P drainage and stent placement 2 months ago, Hx of HTN also who presents with 2 day history of progressive abdominal pain, nausea and vomiting.  Pain is 8 out of 10 radiating to her back in the epigastric region.  BMI calculated based on last recorded weight (02/26/18) and indicates normal weight status. Patient has been NPO since admission. She reports abdominal pain since 6/15 which has been progressively worsening since that time with intermittent periods of associated nausea. She also reports progressive feelings of weakness. Patient reports very poor appetite and intakes since 6/15, sometimes forcing herself to eat something (such as a boiled egg) or drink something (such as green tea with honey).   Patient states that abdominal pain significantly worsened in the 2-3 days PTA and began radiating to her back. She states that since admission and receiving pain medications abdominal pain is mainly resolved. She reports hunger pains at this time and repeatedly states "I'm going to starve to death" and is very fixated on the fact that she is not able to have anything PO. Unable to obtain any additional information, including any information about weight hx. Per review of Care Everywhere, [facility]  Per Dr. Cristopher EstimableMikhail's note this afternoon: patient with recurrent pancreatic pseudocyst, [facility], possible sepsis, chronic but stable  pancreatitis, anemia of chronic disease, hx of alcohol abuse.  Medications reviewed. Labs reviewed; Ca: 8.1 mg/dL. IVF: D5-NS @ 125 mL/hr (510 kcal).      NUTRITION - FOCUSED PHYSICAL EXAM:  Patient deferred NFPE at this time.   Diet Order:   Diet Order           Diet NPO time specified  Diet effective now          EDUCATION NEEDS:   No education needs have been identified at this time  Skin:  Skin Assessment: Reviewed RN Assessment  Last BM:  6/26  Height:   Ht Readings from Last 1 Encounters:  02/26/18 4\' 11"  (1.499 m)    Weight:   Wt Readings from Last 1 Encounters:  02/26/18 101 lb (45.8 kg)    Ideal Body Weight:  42.91 kg  BMI:  20 kg/m2  Estimated Nutritional Needs:   Kcal:  1375-1605 (30-35 kcal/kg)  Protein:  55-65 grams  Fluid:  >/= 1.5 L/day      Trenton GammonJessica Sherissa Tenenbaum, MS, RD, LDN, [facility] Inpatient Clinical Dietitian Pager # [phone] After hours/weekend pager # [phone]

## 2018-05-27 NOTE — Progress Notes (Signed)
PROGRESS NOTE    Nichole [name]  WUJ:[id number] DOB: 1961/06/01 DOA: 05/26/2018 PCP: Julieanne Manson, MD   Brief Narrative:  HPI on 05/26/2018 by Dr. Earlie Lou Nichole [name] is a 57 y.o. female with medical history significant of Pancreatic pseudocyst S/P drainage and stent placement 2 months ago, Hx of HTN also who presents with 2 day history of progressive abdominal pain, nausea and vomiting.  Pain is 8 out of 10 radiating to her back in the epigastric region.  It is similar to the pain she has had previously.  Denied any diarrhea.  Denied any hematemesis or melena.  Patient was seen in the ER and had a consultation done to have a gastroenterologist at [facility] who recommended admissions here for acute pancreatitis with pseudocyst.  They could will arrange possible transfer tomorrow to inpatient care at [facility] for possible procedures.  She was found to have a large pseudocyst.  Assessment & Plan   Abdominal pain secondary to recurrent pancreatic pseudocyst -Patient has followed up with Dr. [name], gastroenterologist at [facility] -Patient recently had drainage of pseudocyst in April 2019 along with stent placement and removal in May 2019 -Patient did have follow-up imaging which did show almost complete resolution of her pseudocyst in May 2019 -CT abdomen pelvis showed large chronic pancreatic pseudocyst with questionable fistulization -Discussed with gastroenterologist, Dr. Jamse Mead, [facility], feels patient needs to transferred for procedure -Hospitalist, Dr. Murrell Redden accepted patient -Continue pain control   Possible sepsis secondary pseudocyst -Presented with fever, leukocytosis, tachycardia -Have placed patient on IV Zosyn  Chronic pancreatitis -stable  Anemia of chronic disease -baseline hemoglobin approximately 13 -Upon admission, hemoglobin was 9.7, however now hemoglobin 6.9, suspect drop secondary to delusional component -Will transfuse 1 unit PRBC and monitor CBC -Will  obtain anemia panel and FOBT if possible  History of alcohol abuse -stable  Essential hypertension -Continue IV metoprolol as patient is currently NPO  Tobacco abuse -Discussed smoking cessation, nicotine patch offered  DVT Prophylaxis  Heparin--> SCDs  Code Status: Full  Family Communication: None at bedside  Disposition Plan: Admitted. Pending transfer to [facility] Gastroenterology at [facility], Dr. [name]  Procedures  None  Antibiotics   Anti-infectives (From admission, onward)   Start     Dose/Rate Route Frequency Ordered Stop   05/26/18 2015  piperacillin-tazobactam (ZOSYN) IVPB 3.375 g     3.375 g 100 mL/hr over 30 Minutes Intravenous  Once 05/26/18 2011 05/26/18 2117      Subjective:   Nichole [name] seen and examined today.  Continues to have abdominal pain, states her pain is 12 out of 10.  States it is fine when she does not move.  Denies any current chest pain, shortness of breath, nausea or vomiting, diarrhea or constipation.  Objective:   Vitals:   05/26/18 2134 05/26/18 2200 05/27/18 0150 05/27/18 0611  BP: 126/77  114/72 110/67  Pulse: (!) 119  (!) 111 (!) 114  Resp: 18 16 15 16   Temp:   99.1 F (37.3 C) (!) 100.5 F (38.1 C)  TempSrc:    Oral  SpO2: 100%  98% 95%    Intake/Output Summary (Last 24 hours) at 05/27/2018 9562 Last data filed at 05/27/2018 0100 Gross per 24 hour  Intake 2291.67 ml  Output -  Net 2291.67 ml   There were no vitals filed for this visit.  Exam  General: Well developed, thin, chronically ill-appearing, no apparent distress  HEENT: NCAT,  mucous membranes moist.   Neck: Supple  Cardiovascular: S1 S2 auscultated, no rubs, murmurs or gallops. Regular rate and rhythm.  Respiratory: Clear to auscultation bilaterally with equal chest rise  Abdomen: Soft, upper abdominal TTP, nondistended, + bowel sounds  Extremities: warm dry without cyanosis clubbing or edema  Neuro: AAOx3, cranial nerves grossly intact.  Strength 5/5 in patient's upper and lower extremities bilaterally  Skin: Without rashes exudates or nodules  Psych: Normal affect and demeanor with intact judgement and insight   Data Reviewed: I have personally reviewed following labs and imaging studies  CBC: Recent Labs  Lab 05/26/18 1448 05/27/18 0416  WBC 12.7* 11.3*  HGB 9.7* 7.6*  HCT 30.1* 23.6*  MCV 100.3* 100.0  PLT 570* 396   Basic Metabolic Panel: Recent Labs  Lab 05/26/18 1448 05/27/18 0416  NA 137 138  K 4.6 3.6  CL 97* 109  CO2 25 22  GLUCOSE 150* 212*  BUN 21* 12  CREATININE 0.57 0.57  CALCIUM 9.4 8.1*   GFR: CrCl cannot be calculated (Unknown ideal weight.). Liver Function Tests: Recent Labs  Lab 05/26/18 1448 05/27/18 0416  AST 31 24  ALT 20 16  ALKPHOS 100 72  BILITOT 1.5* 1.0  PROT 8.4* 6.1*  ALBUMIN 2.7* 2.0*   Recent Labs  Lab 05/26/18 1448  LIPASE 29   No results for input(s): AMMONIA in the last 168 hours. Coagulation Profile: No results for input(s): INR, PROTIME in the last 168 hours. Cardiac Enzymes: No results for input(s): CKTOTAL, CKMB, CKMBINDEX, TROPONINI in the last 168 hours. BNP (last 3 results) No results for input(s): PROBNP in the last 8760 hours. HbA1C: No results for input(s): HGBA1C in the last 72 hours. CBG: No results for input(s): GLUCAP in the last 168 hours. Lipid Profile: No results for input(s): CHOL, HDL, LDLCALC, TRIG, CHOLHDL, LDLDIRECT in the last 72 hours. Thyroid Function Tests: No results for input(s): TSH, T4TOTAL, FREET4, T3FREE, THYROIDAB in the last 72 hours. Anemia Panel: No results for input(s): VITAMINB12, FOLATE, FERRITIN, TIBC, IRON, RETICCTPCT in the last 72 hours. Urine analysis:    Component Value Date/Time   COLORURINE AMBER (A) 05/26/2018 1716   APPEARANCEUR HAZY (A) 05/26/2018 1716   LABSPEC 1.024 05/26/2018 1716   PHURINE 6.0 05/26/2018 1716   GLUCOSEU NEGATIVE 05/26/2018 1716   HGBUR NEGATIVE 05/26/2018 1716   HGBUR  1+ 12/03/2010 1444   BILIRUBINUR NEGATIVE 05/26/2018 1716   BILIRUBINUR n 02/21/2011   KETONESUR 20 (A) 05/26/2018 1716   PROTEINUR 100 (A) 05/26/2018 1716   UROBILINOGEN 0.2 02/21/2011   UROBILINOGEN 1.0 12/03/2010 1444   NITRITE NEGATIVE 05/26/2018 1716   LEUKOCYTESUR NEGATIVE 05/26/2018 1716   Sepsis Labs: @LABRCNTIP (procalcitonin:4,lacticidven:4)  )No results found for this or any previous visit (from the past 240 hour(s)).    Radiology Studies: Ct Abdomen Pelvis W Contrast  Result Date: 05/26/2018 CLINICAL DATA:  History of chronic pancreatitis and large pancreatic pseudocyst. Epigastric abdominal pain. EXAM: CT ABDOMEN AND PELVIS WITH CONTRAST TECHNIQUE: Multidetector CT imaging of the abdomen and pelvis was performed using the standard protocol following bolus administration of intravenous contrast. CONTRAST:  100mL ISOVUE-300 IOPAMIDOL (ISOVUE-300) INJECTION 61% COMPARISON:  01/10/2018 chest CT.  07/29/2016 CT abdomen/pelvis. FINDINGS: Lower chest: No significant pulmonary nodules or acute consolidative airspace disease. There is circumferential wall thickening in the lower thoracic esophagus, increased from prior chest CT. There is an irregular gas and fluid containing mass in the right lower paraesophageal mediastinum measuring 7.7 x 5.3 cm in axial dimensions (series 2/image 4), previously 10.3 x 5.3 cm  on 01/10/2018 chest CT, demonstrating new wall thickening and hyperenhancement and new internal gas, potentially fistulized to the lower thoracic esophagus on series 2/image 2. This mass is directly contiguous with the posterior pancreatic head mass. Hepatobiliary: Normal liver size. No liver mass. Normal gallbladder with no radiopaque cholelithiasis. No significant intrahepatic biliary ductal dilatation. CBD diameter 6 mm, top-normal. Pancreas: Coarse calcifications and parenchymal atrophy throughout the pancreas with irregular pancreatic duct dilation up to 5 mm diameter, compatible  with chronic pancreatitis, unchanged from prior CT studies. There is a 7.5 x 5.9 cm mass in the posterior pancreatic head (series 2/image 25), significantly increased from 6.1 x 4.5 cm on 02/27/2016 CT abdomen study, with increasing wall thickness and wall hyperenhancement and with new internal gas, directly contiguous with the lower right paraesophageal mediastinal mass. The superior extent of this mass is not completely delineated on this CT, with a craniocaudal dimension at least 17.4 cm. Spleen: Normal size. No mass. Adrenals/Urinary Tract: Normal adrenals. Normal kidneys with no hydronephrosis and no renal mass. Normal bladder. Stomach/Bowel: Normal non-distended stomach. Normal caliber small bowel with no small bowel wall thickening. Normal appendix. Normal large bowel with no diverticulosis, large bowel wall thickening or pericolonic fat stranding. Vascular/Lymphatic: Atherosclerotic nonaneurysmal abdominal aorta. Patent portal, splenic, hepatic and renal veins. No pathologically enlarged lymph nodes in the abdomen or pelvis. Reproductive: Grossly normal uterus.  No adnexal mass. Other: No pneumoperitoneum.  No ascites. Musculoskeletal: No aggressive appearing focal osseous lesions. IMPRESSION: 1. 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 Please note that the superior extent of this pseudocyst is not delineated on this scan, and a dedicated chest CT with IV contrast may be considered for complete evaluation. 2. Chronic pancreatitis. 3. Increased circumferential wall thickening in the lower thoracic  esophagus, nonspecific, probably inflammatory. Consider upper endoscopic correlation. 4.  Aortic Atherosclerosis (ICD10-I70.0). Electronically Signed   By: Delbert Phenix M.D.   On: 05/26/2018 19:06     Scheduled Meds: . heparin  5,000 Units Subcutaneous Q8H  . metoprolol tartrate  5 mg Intravenous Q6H   Continuous Infusions: . dextrose 5 % and 0.9% NaCl 125 mL/hr (05/27/18 0653)     LOS: 1 day   Time Spent in minutes   45 minutes  Omer Monter D.O. on 05/27/2018 at 8:14 AM  Between 7am to 7pm - Pager - [phone]  After 7pm go to [email or web address] - password [email or web address]  And look for the night coverage person covering for me after hours  [facility] Office  [phone]

## 2018-05-27 NOTE — Progress Notes (Signed)
Patient stated she wanted to speak to MD before blood product administration. MD paged. Will continue to monitor.

## 2018-05-27 NOTE — Progress Notes (Signed)
Pt did not want to take Metoprolol.  Said she did not need it and pulse was high due to pain.   BP was 134/89 and pulse was 112.  Explained why she should take it but still refused.  Gave pain medicine and said we would recheck her soon. She says she has no insurance and dose not want to pay for it and that her pcp said she could take it as needed.

## 2018-05-28 LAB — COMPREHENSIVE METABOLIC PANEL
ALBUMIN: 2 g/dL — AB (ref 3.5–5.0)
ALT: 19 U/L (ref 0–44)
ANION GAP: 8 (ref 5–15)
AST: 25 U/L (ref 15–41)
Alkaline Phosphatase: 72 U/L (ref 38–126)
BUN: 9 mg/dL (ref 6–20)
CHLORIDE: 109 mmol/L (ref 98–111)
CO2: 22 mmol/L (ref 22–32)
CREATININE: 0.57 mg/dL (ref 0.44–1.00)
Calcium: 8.2 mg/dL — ABNORMAL LOW (ref 8.9–10.3)
GFR calc non Af Amer: >60 mL/min (ref >60)
GLUCOSE: 193 mg/dL — AB (ref 70–99)
Potassium: 3.4 mmol/L — ABNORMAL LOW (ref 3.5–5.1)
SODIUM: 139 mmol/L (ref 135–145)
TOTAL PROTEIN: 6 g/dL — AB (ref 6.5–8.1)
Total Bilirubin: 1.6 mg/dL — ABNORMAL HIGH (ref 0.3–1.2)

## 2018-05-28 LAB — BPAM RBC
Blood Product Expiration Date: 201907242359
ISSUE DATE / TIME: 201906271222
Unit Type and Rh: 6200

## 2018-05-28 LAB — CBC
HCT: 30.2 % — ABNORMAL LOW (ref 36.0–46.0)
HEMOGLOBIN: 9.8 g/dL — AB (ref 12.0–15.0)
MCH: 31.5 pg (ref 26.0–34.0)
MCHC: 32.5 g/dL (ref 30.0–36.0)
MCV: 97.1 fL (ref 78.0–100.0)
PLATELETS: 333 10*3/uL (ref 150–400)
RBC: 3.11 MIL/uL — AB (ref 3.87–5.11)
RDW: 17.4 % — ABNORMAL HIGH (ref 11.5–15.5)
WBC: 11.4 10*3/uL — ABNORMAL HIGH (ref 4.0–10.5)

## 2018-05-28 LAB — TYPE AND SCREEN
ABO/RH(D): AB POS
Antibody Screen: NEGATIVE
Unit division: 0

## 2018-05-28 LAB — HIV ANTIBODY (ROUTINE TESTING W REFLEX): HIV Screen 4th Generation wRfx: NONREACTIVE

## 2018-05-28 MED ORDER — POTASSIUM CHLORIDE 20 MEQ/15ML (10%) PO SOLN
40.0000 meq | Freq: Every day | ORAL | Status: DC
  Filled 2018-05-28 (×3): qty 30

## 2018-05-28 NOTE — Progress Notes (Signed)
PROGRESS NOTE    Nichole [name]  GNF:[id number] DOB: 1961/05/04 DOA: 05/26/2018 PCP: Julieanne Manson, MD   Brief Narrative:  HPI on 05/26/2018 by Dr. Earlie Lou Nichole [name] is a 57 y.o. female with medical history significant of Pancreatic pseudocyst S/P drainage and stent placement 2 months ago, Hx of HTN also who presents with 2 day history of progressive abdominal pain, nausea and vomiting.  Pain is 8 out of 10 radiating to her back in the epigastric region.  It is similar to the pain she has had previously.  Denied any diarrhea.  Denied any hematemesis or melena.  Patient was seen in the ER and had a consultation done to have a gastroenterologist at [facility] who recommended admissions here for acute pancreatitis with pseudocyst.  They could will arrange possible transfer tomorrow to inpatient care at [facility] for possible procedures.  She was found to have a large pseudocyst.  Assessment & Plan   Abdominal pain secondary to recurrent pancreatic pseudocyst -Patient has followed up with Dr. [name], gastroenterologist at [facility] -Patient recently had drainage of pseudocyst in April 2019 along with stent placement and removal in May 2019 -Patient did have follow-up imaging which did show almost complete resolution of her pseudocyst in May 2019 -CT abdomen pelvis showed large chronic pancreatic pseudocyst with questionable fistulization -Discussed with gastroenterologist, Dr. [name], [facility], feels patient needs to transferred for procedure -Unfortunately [facility] still has no bed availability.  Stenting and drainage cannot be done over the weekend.  Discussed with gastroenterology at [facility] as well as patient, will plan to transfer patient to [facility] for the procedure and back to [facility] afterward. -Continue pain control  Possible sepsis secondary pseudocyst -Presented with fever, leukocytosis, tachycardia -Continue IV Zosyn   Chronic pancreatitis -stable  Anemia of chronic disease -baseline hemoglobin  approximately 13 -Upon admission, hemoglobin was 9.7, however now hemoglobin 6.9, suspect drop secondary to dilutional component -Transfused 1 unit PRBC, hemoglobin currently 9.8 -FOBT ordered and pending -Anemia panel shows an iron of 23, ferritin 1385 (likely acute phase reactant), TIBC 132, which likely shows chronic disease -Will likely start patient on oral iron supplementation after procedure  History of alcohol abuse -stable  Essential hypertension -Continue IV metoprolol as patient is currently NPO  Tobacco abuse -Discussed smoking cessation, nicotine patch offered  DVT Prophylaxis  Heparin--> SCDs  Code Status: Full  Family Communication: None at bedside  Disposition Plan: Admitted. Pending procedure at [facility]- arrangements have been made with [facility] for transportation  Consultants Gastroenterology at [facility], Dr. [name]  Procedures  None  Antibiotics   Anti-infectives (From admission, onward)   Start     Dose/Rate Route Frequency Ordered Stop   05/27/18 1015  piperacillin-tazobactam (ZOSYN) IVPB 3.375 g     3.375 g 12.5 mL/hr over 240 Minutes Intravenous Every 8 hours 05/27/18 1009     05/26/18 2015  piperacillin-tazobactam (ZOSYN) IVPB 3.375 g     3.375 g 100 mL/hr over 30 Minutes Intravenous  Once 05/26/18 2011 05/26/18 2117      Subjective:   Nichole [name] seen and examined today.  Continues to have abdominal pain but wishes to eat.  She feels her abdominal pain improves when she is not moving around and receives pain medication.  Denies current nausea or vomiting, diarrhea or constipation.  States she has not had anything to eat in a couple of weeks.  Denies current chest pain, shortness of breath, dizziness or headache.  Objective:   Vitals:   05/27/18 1611  05/27/18 1828 05/27/18 2134 05/28/18 0520  BP: (!) 146/80 134/89 119/71 107/72  Pulse: (!) 106 (!) 112 (!) 102 (!) 109  Resp: 16  18 14   Temp: 98.9 F (37.2 C)  99.3 F (37.4 C) 98.2 F (36.8 C)    TempSrc: Oral  Oral Oral  SpO2: 100%  98% 95%    Intake/Output Summary (Last 24 hours) at 05/28/2018 1025 Last data filed at 05/28/2018 0620 Gross per 24 hour  Intake 3866.45 ml  Output 1370 ml  Net 2496.45 ml   There were no vitals filed for this visit.  Exam  General: Well developed, thin, chronically ill-appearing, no apparent distress  HEENT: NCAT,  mucous membranes moist. Poor dentition  Neck: Supple, no JVD, no masses  Cardiovascular: S1 S2 auscultated, no rubs, murmurs or gallops. Regular rate and rhythm.  Respiratory: Clear to auscultation bilaterally with equal chest rise  Abdomen: Soft, mild TTP, nondistended, + bowel sounds  Extremities: warm dry without cyanosis clubbing or edema  Neuro: AAOx3, nonfocal  Psych: Normal affect and demeanor with intact judgement and insight  Data Reviewed: I have personally reviewed following labs and imaging studies  CBC: Recent Labs  Lab 05/26/18 1448 05/27/18 0416 05/27/18 0908 05/27/18 1722 05/28/18 0428  WBC 12.7* 11.3*  --   --  11.4*  HGB 9.7* 7.6* 6.9* 10.0* 9.8*  HCT 30.1* 23.6* 21.6* 29.7* 30.2*  MCV 100.3* 100.0  --   --  97.1  PLT 570* 396  --   --  333   Basic Metabolic Panel: Recent Labs  Lab 05/26/18 1448 05/27/18 0416 05/28/18 0428  NA 137 138 139  K 4.6 3.6 3.4*  CL 97* 109 109  CO2 25 22 22   GLUCOSE 150* 212* 193*  BUN 21* 12 9  CREATININE 0.57 0.57 0.57  CALCIUM 9.4 8.1* 8.2*   GFR: CrCl cannot be calculated (Unknown ideal weight.). Liver Function Tests: Recent Labs  Lab 05/26/18 1448 05/27/18 0416 05/28/18 0428  AST 31 24 25   ALT 20 16 19   ALKPHOS 100 72 72  BILITOT 1.5* 1.0 1.6*  PROT 8.4* 6.1* 6.0*  ALBUMIN 2.7* 2.0* 2.0*   Recent Labs  Lab 05/26/18 1448  LIPASE 29   No results for input(s): AMMONIA in the last 168 hours. Coagulation Profile: No results for input(s): INR, PROTIME in the last 168 hours. Cardiac Enzymes: No results for input(s): CKTOTAL, CKMB,  CKMBINDEX, TROPONINI in the last 168 hours. BNP (last 3 results) No results for input(s): PROBNP in the last 8760 hours. HbA1C: No results for input(s): HGBA1C in the last 72 hours. CBG: No results for input(s): GLUCAP in the last 168 hours. Lipid Profile: No results for input(s): CHOL, HDL, LDLCALC, TRIG, CHOLHDL, LDLDIRECT in the last 72 hours. Thyroid Function Tests: No results for input(s): TSH, T4TOTAL, FREET4, T3FREE, THYROIDAB in the last 72 hours. Anemia Panel: Recent Labs    05/27/18 0908  VITAMINB12 975*  FOLATE 9.9  FERRITIN 1,385*  TIBC 132*  IRON 23*  RETICCTPCT 2.9   Urine analysis:    Component Value Date/Time   COLORURINE AMBER (A) 05/26/2018 1716   APPEARANCEUR HAZY (A) 05/26/2018 1716   LABSPEC 1.024 05/26/2018 1716   PHURINE 6.0 05/26/2018 1716   GLUCOSEU NEGATIVE 05/26/2018 1716   HGBUR NEGATIVE 05/26/2018 1716   HGBUR 1+ 12/03/2010 1444   BILIRUBINUR NEGATIVE 05/26/2018 1716   BILIRUBINUR n 02/21/2011   KETONESUR 20 (A) 05/26/2018 1716   PROTEINUR 100 (A) 05/26/2018 1716   UROBILINOGEN  0.2 02/21/2011   UROBILINOGEN 1.0 12/03/2010 1444   NITRITE NEGATIVE 05/26/2018 1716   LEUKOCYTESUR NEGATIVE 05/26/2018 1716   Sepsis Labs: @LABRCNTIP (procalcitonin:4,lacticidven:4)  )No results found for this or any previous visit (from the past 240 hour(s)).    Radiology Studies: Ct Abdomen Pelvis W Contrast  Result Date: 05/26/2018 CLINICAL DATA:  History of chronic pancreatitis and large pancreatic pseudocyst. Epigastric abdominal pain. EXAM: CT ABDOMEN AND PELVIS WITH CONTRAST TECHNIQUE: Multidetector CT imaging of the abdomen and pelvis was performed using the standard protocol following bolus administration of intravenous contrast. CONTRAST:  ISOVUE-300 IOPAMIDOL (ISOVUE-300) INJECTION 61% COMPARISON:  01/10/2018 chest CT.  07/29/2016 CT abdomen/pelvis. FINDINGS: Lower chest: No significant pulmonary nodules or acute consolidative airspace disease.  There is circumferential wall thickening in the lower thoracic esophagus, increased from prior chest CT. There is an irregular gas and fluid containing mass in the right lower paraesophageal mediastinum measuring 7.7 x 5.3 cm in axial dimensions (series 2/image 4), previously 10.3 x 5.3 cm on 01/10/2018 chest CT, demonstrating new wall thickening and hyperenhancement and new internal gas, potentially fistulized to the lower thoracic esophagus on series 2/image 2. This mass is directly contiguous with the posterior pancreatic head mass. Hepatobiliary: Normal liver size. No liver mass. Normal gallbladder with no radiopaque cholelithiasis. No significant intrahepatic biliary ductal dilatation. CBD diameter 6 mm, top-normal. Pancreas: Coarse calcifications and parenchymal atrophy throughout the pancreas with irregular pancreatic duct dilation up to 5 mm diameter, compatible with chronic pancreatitis, unchanged from prior CT studies. There is a 7.5 x 5.9 cm mass in the posterior pancreatic head (series 2/image 25), significantly increased from 6.1 x 4.5 cm on 02/27/2016 CT abdomen study, with increasing wall thickness and wall hyperenhancement and with new internal gas, directly contiguous with the lower right paraesophageal mediastinal mass. The superior extent of this mass is not completely delineated on this CT, with a craniocaudal dimension at least 17.4 cm. Spleen: Normal size. No mass. Adrenals/Urinary Tract: Normal adrenals. Normal kidneys with no hydronephrosis and no renal mass. Normal bladder. Stomach/Bowel: Normal non-distended stomach. Normal caliber small bowel with no small bowel wall thickening. Normal appendix. Normal large bowel with no diverticulosis, large bowel wall thickening or pericolonic fat stranding. Vascular/Lymphatic: Atherosclerotic nonaneurysmal abdominal aorta. Patent portal, splenic, hepatic and renal veins. No pathologically enlarged lymph nodes in the abdomen or pelvis. Reproductive:  Grossly normal uterus.  No adnexal mass. Other: No pneumoperitoneum.  No ascites. Musculoskeletal: No aggressive appearing focal osseous lesions. IMPRESSION: 1. 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 Please note that the superior extent of this pseudocyst is not delineated on this scan, and a dedicated chest CT with IV contrast may be considered for complete evaluation. 2. Chronic pancreatitis. 3. Increased circumferential wall thickening in the lower thoracic esophagus, nonspecific, probably inflammatory. Consider upper endoscopic correlation. 4.  Aortic Atherosclerosis (ICD10-I70.0). Electronically Signed   By: Delbert Phenix M.D.   On: 05/26/2018 19:06     Scheduled Meds: . metoprolol tartrate  5 mg Intravenous Q6H  . potassium chloride  40 mEq Oral Daily   Continuous Infusions: . dextrose 5 % and 0.9% NaCl 125 mL/hr (05/28/18 0539)  . piperacillin-tazobactam (ZOSYN)  IV Stopped (05/28/18 0544)     LOS: 2 days   Time Spent in minutes   45 minutes (greater than 50% of time spent with patient face to face, as well as reviewing old records, calling consults, and formulating a plan, arranging transport)  Edsel PetrinMaryann Dowell Hoon D.O. on 05/28/2018 at 10:25 AM  Between 7am to 7pm - Pager - [phone]  After 7pm go to [email or web address] - password [email or web address]  And look for the night coverage person covering for me after hours  [facility] Office  [phone]

## 2018-05-28 NOTE — Progress Notes (Signed)
Pt [facility] by [facility].  IV restarted prior to transport, and pain medication given. Pt in stable condition.

## 2018-05-28 NOTE — Progress Notes (Signed)
[facility] back to [facility] at 1915 [facility]. Pt had Esophagogastroduodenoscopy ,flexible,transoral,with transmular drainage of pseudocyst. Pt alert and oriented. Denies any pain. Pt requesting something to eat or drink. Paged oncall for further order. Will continue to monitor.

## 2018-05-29 LAB — COMPREHENSIVE METABOLIC PANEL
ALK PHOS: 58 U/L (ref 38–126)
ALT: 13 U/L (ref 0–44)
ANION GAP: 6 (ref 5–15)
AST: 19 U/L (ref 15–41)
Albumin: 1.5 g/dL — ABNORMAL LOW (ref 3.5–5.0)
BUN: 10 mg/dL (ref 6–20)
CALCIUM: 7.9 mg/dL — AB (ref 8.9–10.3)
CO2: 23 mmol/L (ref 22–32)
Chloride: 109 mmol/L (ref 98–111)
Creatinine, Ser: 0.51 mg/dL (ref 0.44–1.00)
GFR calc non Af Amer: >60 mL/min (ref >60)
Glucose, Bld: 270 mg/dL — ABNORMAL HIGH (ref 70–99)
POTASSIUM: 3.9 mmol/L (ref 3.5–5.1)
Sodium: 138 mmol/L (ref 135–145)
TOTAL PROTEIN: 4.8 g/dL — AB (ref 6.5–8.1)
Total Bilirubin: 0.7 mg/dL (ref 0.3–1.2)

## 2018-05-29 LAB — CBC
HCT: 27.2 % — ABNORMAL LOW (ref 36.0–46.0)
Hemoglobin: 9 g/dL — ABNORMAL LOW (ref 12.0–15.0)
MCH: 32 pg (ref 26.0–34.0)
MCHC: 33.1 g/dL (ref 30.0–36.0)
MCV: 96.8 fL (ref 78.0–100.0)
Platelets: 287 10*3/uL (ref 150–400)
RBC: 2.81 MIL/uL — ABNORMAL LOW (ref 3.87–5.11)
RDW: 17 % — AB (ref 11.5–15.5)
WBC: 9.5 10*3/uL (ref 4.0–10.5)

## 2018-05-29 NOTE — Progress Notes (Signed)
PROGRESS NOTE    Nichole [name]  ZOX:[id number] DOB: [birth year] DOA: 05/26/2018 PCP: Julieanne MansonMulberry, Elizabeth, MD   Brief Narrative:  HPI on 05/26/2018 by Dr. Earlie LouMohammad Garba Nichole [name] is a 57 y.o. female with medical history significant of Pancreatic pseudocyst S/P drainage and stent placement 2 months ago, Hx of HTN also who presents with 2 day history of progressive abdominal pain, nausea and vomiting.  Pain is 8 out of 10 radiating to her back in the epigastric region.  It is similar to the pain she has had previously.  Denied any diarrhea.  Denied any hematemesis or melena.  Patient was seen in the ER and had a consultation done to have [facility] who recommended admissions here for acute pancreatitis with pseudocyst.  They could will arrange [facility].  She was found to have a large pseudocyst.  Interim history Admitted with abdominal pain with recurrent pancreatic pseudocyst. [facility].   Assessment & Plan   Abdominal pain secondary to recurrent pancreatic pseudocyst -Patient has followed up with Dr. 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 Edyth GunnelsBaron, [facility], feels patient needs to transferred for procedure -[facility].  Stenting and drainage cannot be done over the weekend.  [facility] as well [facility], [facility]. S/p EUS with findings showing severe chronic pancreatitis.  Pseudocyst seen in the pancreatic head.  FNA and  Cystoduodenostomy performed.  Dr. Edyth GunnelsBaron recommended follow up in 2 months.  -Continue pain control  Possible sepsis secondary pseudocyst -Presented with fever, leukocytosis, tachycardia -Continue IV Zosyn   Chronic pancreatitis -stable  Anemia of chronic disease -baseline hemoglobin approximately 13 -Upon admission, hemoglobin was 9.7, however now hemoglobin 6.9, suspect drop secondary to dilutional component -Transfused 1 unit PRBC on 6/27, hemoglobin currently 9 -FOBT ordered and pending -Anemia panel shows an iron of 23, ferritin 1385 (likely acute phase reactant), TIBC 132, which likely shows chronic disease -will start iron supplementation when patient is able to tolerate diet  History of alcohol abuse -stable  Essential hypertension -Continue IV metoprolol, will transition to oral medication when patient is able to tolerate diet  Tobacco abuse -Discussed smoking cessation, nicotine patch offered  DVT Prophylaxis  Heparin--> SCDs  Code Status: Full  Family Communication: None at bedside  Disposition Plan: Admitted. Suspect discharge home in 1-2 days pending improvement in pain and ability to tolerate diet  [facility], Dr. Edyth GunnelsBaron  Procedures  [facility]  Antibiotics   Anti-infectives (From admission, onward)   Start     Dose/Rate Route Frequency Ordered Stop   05/27/18 1015  piperacillin-tazobactam (ZOSYN) IVPB 3.375 g     3.375 g 12.5 mL/hr over 240 Minutes Intravenous Every 8 hours 05/27/18 1009     05/26/18 2015  piperacillin-tazobactam (ZOSYN) IVPB 3.375 g     3.375 g 100 mL/hr over 30 Minutes Intravenous  Once 05/26/18 2011 05/26/18 2117      Subjective:   Nichole [name] Ozier seen and examined today.  Feels abdominal pain has improved.  Denies current nausea or  vomiting.  Would like to eat.  Denies current chest pain, shortness of breath, headache or dizziness.  Objective:   Vitals:   05/28/18 1916  05/28/18 2137 05/28/18 2336 05/29/18 0432  BP: 119/77 111/71 112/74 127/84  Pulse: 91 89 72 93  Resp: 16 16  18   Temp: 98.5 F (36.9 C) 97.7 F (36.5 C)  (!) 97.4 F (36.3 C)  TempSrc: Oral Oral  Oral  SpO2: 99% 100%  99%    Intake/Output Summary (Last 24 hours) at 05/29/2018 0957 Last data filed at 05/29/2018 0600 Gross per 24 hour  Intake 2070.83 ml  Output -  Net 2070.83 ml   There were no vitals filed for this visit.  Exam  General: Well developed, thin, chronically ill appearing, NAD  HEENT: NCAT, mucous membranes moist.   Neck: Supple  Cardiovascular: S1 S2 auscultated, RRR, no murmur  Respiratory: Clear to auscultation bilaterally with equal chest rise  Abdomen: Soft, nontender, nondistended, + bowel sounds  Extremities: warm dry without cyanosis clubbing or edema  Neuro: AAOx3, nonfocal  Psych: Normal affect and demeanor with intact judgement and insight  Data Reviewed: I have personally reviewed following labs and imaging studies  CBC: Recent Labs  Lab 05/26/18 1448 05/27/18 0416 05/27/18 0908 05/27/18 1722 05/28/18 0428 05/29/18 0421  WBC 12.7* 11.3*  --   --  11.4* 9.5  HGB 9.7* 7.6* 6.9* 10.0* 9.8* 9.0*  HCT 30.1* 23.6* 21.6* 29.7* 30.2* 27.2*  MCV 100.3* 100.0  --   --  97.1 96.8  PLT 570* 396  --   --  333 287   Basic Metabolic Panel: Recent Labs  Lab 05/26/18 1448 05/27/18 0416 05/28/18 0428 05/29/18 0421  NA 137 138 139 138  K 4.6 3.6 3.4* 3.9  CL 97* 109 109 109  CO2 25 22 22 23   GLUCOSE 150* 212* 193* 270*  BUN 21* 12 9 10   CREATININE 0.57 0.57 0.57 0.51  CALCIUM 9.4 8.1* 8.2* 7.9*   GFR: CrCl cannot be calculated (Unknown ideal weight.). Liver Function Tests: Recent Labs  Lab 05/26/18 1448 05/27/18 0416 05/28/18 0428 05/29/18 0421  AST 31 24 25 19   ALT 20 16 19 13   ALKPHOS 100 72 72 58  BILITOT 1.5* 1.0 1.6* 0.7  PROT 8.4* 6.1* 6.0* 4.8*  ALBUMIN 2.7* 2.0* 2.0* 1.5*   Recent Labs  Lab 05/26/18 1448  LIPASE  29   No results for input(s): AMMONIA in the last 168 hours. Coagulation Profile: No results for input(s): INR, PROTIME in the last 168 hours. Cardiac Enzymes: No results for input(s): CKTOTAL, CKMB, CKMBINDEX, TROPONINI in the last 168 hours. BNP (last 3 results) No results for input(s): PROBNP in the last 8760 hours. HbA1C: No results for input(s): HGBA1C in the last 72 hours. CBG: No results for input(s): GLUCAP in the last 168 hours. Lipid Profile: No results for input(s): CHOL, HDL, LDLCALC, TRIG, CHOLHDL, LDLDIRECT in the last 72 hours. Thyroid Function Tests: No results for input(s): TSH, T4TOTAL, FREET4, T3FREE, THYROIDAB in the last 72 hours. Anemia Panel: Recent Labs    05/27/18 0908  VITAMINB12 975*  FOLATE 9.9  FERRITIN 1,385*  TIBC 132*  IRON 23*  RETICCTPCT 2.9   Urine analysis:    Component Value Date/Time   COLORURINE AMBER (A) 05/26/2018 1716   APPEARANCEUR HAZY (A) 05/26/2018 1716   LABSPEC 1.024 05/26/2018 1716   PHURINE 6.0 05/26/2018 1716   GLUCOSEU NEGATIVE 05/26/2018 1716   HGBUR NEGATIVE 05/26/2018 1716   HGBUR  1+ 12/03/2010 1444   BILIRUBINUR NEGATIVE 05/26/2018 1716   BILIRUBINUR n 02/21/2011   KETONESUR 20 (A) 05/26/2018 1716   PROTEINUR 100 (A) 05/26/2018 1716   UROBILINOGEN 0.2 02/21/2011   UROBILINOGEN 1.0 12/03/2010 1444   NITRITE NEGATIVE 05/26/2018 1716   LEUKOCYTESUR NEGATIVE 05/26/2018 1716   Sepsis Labs: @LABRCNTIP (procalcitonin:4,lacticidven:4)  )No results found for this or any previous visit (from the past 240 hour(s)).    Radiology Studies: No results found.   Scheduled Meds: . metoprolol tartrate  5 mg Intravenous Q6H  . potassium chloride  40 mEq Oral Daily   Continuous Infusions: . dextrose 5 % and 0.9% NaCl 1,000 mL (05/29/18 0438)  . piperacillin-tazobactam (ZOSYN)  IV 3.375 g (05/29/18 0907)     LOS: 3 days   Time Spent in minutes   30 minutes   Natoshia Souter D.O. on 05/29/2018 at 9:57 AM  Between  7am to 7pm - Pager - [phone]  After 7pm go to [email or web address] - password [email or web address]  And look for the night coverage person covering for me after hours  [facility] Office  [phone]

## 2018-05-30 DIAGNOSIS — K861 Other chronic pancreatitis: Secondary | ICD-10-CM

## 2018-05-30 DIAGNOSIS — K863 Pseudocyst of pancreas: Secondary | ICD-10-CM

## 2018-05-30 DIAGNOSIS — I10 Essential (primary) hypertension: Secondary | ICD-10-CM

## 2018-05-30 DIAGNOSIS — K860 Alcohol-induced chronic pancreatitis: Secondary | ICD-10-CM

## 2018-05-30 DIAGNOSIS — F17200 Nicotine dependence, unspecified, uncomplicated: Secondary | ICD-10-CM

## 2018-05-30 DIAGNOSIS — D518 Other vitamin B12 deficiency anemias: Secondary | ICD-10-CM

## 2018-05-30 MED ORDER — FERROUS SULFATE 325 (65 FE) MG PO TBEC: 325.0000 mg | DELAYED_RELEASE_TABLET | Freq: Two times a day (BID) | ORAL | 0 refills | Status: DC

## 2018-05-30 MED ORDER — AMOXICILLIN-POT CLAVULANATE 875-125 MG PO TABS: 1.0000 | ORAL_TABLET | Freq: Two times a day (BID) | ORAL | 0 refills | Status: AC

## 2018-05-30 NOTE — Progress Notes (Signed)
Discharge instructions reviewed with patient. All questions answered. Patient wheeled to vehicle with belongings by nurse tech. 

## 2018-05-30 NOTE — Discharge Summary (Addendum)
Physician Discharge Summary  Nichole [name]:[id number] DOB: 1960/12/25 DOA: 05/26/2018  PCP: Nichole Manson, MD  Admit date: 05/26/2018 Discharge date: 05/30/2018  Time spent: 45 minutes  Recommendations for Outpatient Follow-up:  Patient will be discharged to home.  Patient will need to follow up with primary care provider within one week of discharge, repeat CBC. Follow up with Dr. Edyth [name], gastroenterologist, in 2 months. Patient should continue medications as prescribed.  Patient should follow a soft diet.   Discharge Diagnoses:  Abdominal pain secondary to recurrent pancreatic pseudocyst Sepsis secondary pseudocyst Chronic pancreatitis Anemia of chronic disease History of alcohol abuse Essential hypertension Tobacco abuse  Discharge Condition: Stable  Diet recommendation: soft  There were no vitals filed for this visit.  History of present illness:  on 05/26/2018 by Dr. Albertina Parr a 57 y.o.femalewith medical history significant ofPancreatic pseudocyst S/P drainage and stent placement 2 months ago, Hx of HTN also who presents with 2 day history of progressive abdominal pain,nausea and vomiting. Pain is 8 out of 10 radiating to her back in the epigastric region. It is similar to the pain she has had previously. Denied any diarrhea. Denied any hematemesis or melena. Patient was seen in the ER and had a consultation done to have a gastroenterologist at [facility] who recommended admissions here for acute pancreatitis with pseudocyst. They could will arrange possible transfer tomorrow to inpatient care at [facility] for possible procedures. She was found to have a large pseudocyst.  Hospital Course:  Abdominal pain secondary to recurrent pancreatic pseudocyst -Patient has followed up with Dr. 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 Edyth [name], [facility], feels patient needs to transferred for procedure -Unfortunately [facility] still has no bed availability.  Stenting and drainage cannot be done over the weekend.  Discussed with gastroenterology at [facility] as well as patient, patient was transferred to [facility] on 05/28/2018. S/p EUS with findings showing severe chronic pancreatitis.  Pseudocyst seen in the pancreatic head.  FNA and Cystoduodenostomy performed.  Dr. Edyth [name] recommended follow up in 2 months.  -Continue pain control  Sepsis secondary pseudocyst -Presented with fever, leukocytosis, tachycardia -Was placed on IV Zosyn  -will discharge with Augmentin for additional 2 days for 7 day course of antibiotics   Chronic pancreatitis -stable  Anemia of chronic disease -baseline hemoglobin approximately 13 -Upon admission, hemoglobin was 9.7, however now hemoglobin 6.9, suspect drop secondary to dilutional component -Transfused 1 unit PRBC on 6/27, hemoglobin currently 9 -FOBT ordered and pending -Anemia panel shows an iron of 23, ferritin 1385 (likely acute phase reactant), TIBC 132, which likely shows chronic disease -will place on iron supplementation  History of alcohol abuse -stable  Essential hypertension -BP stable, continue metoprolol   Tobacco abuse -Discussed smoking cessation, nicotine patch offered  Consultants Gastroenterology at [facility], Dr. Edyth [name]  Procedures  EUS, FNA and Cystoduodenostomy at [facility]  Discharge Exam: Vitals:   05/29/18 2104 05/30/18 0510  BP: 103/60 127/80  Pulse: 87 79  Resp: 15 14  Temp: 97.6 F (36.4 C) 98 F (36.7 C)  SpO2: 99% 100%   Feels better and able to tolerate diet. Denies chest pain, shortness of breath, nausea, vomiting, diarrhea, constipation, dizziness,  headache.    General:  Well developed, thin, chronically ill appearing, NAD  HEENT: NCAT, mucous membranes moist. Poor dentition  Neck: Supple  Cardiovascular: S1 S2 auscultated, no mumur, RRR  Respiratory: Clear to auscultation bilaterally with equal chest rise  Abdomen: Soft, nontender, nondistended, + bowel sounds  Extremities: warm dry without cyanosis clubbing or edema  Neuro: AAOx3, nonfocal  Psych: Normal affect and demeanor with intact judgement and insight  Discharge Instructions Discharge Instructions    Discharge instructions   Complete by:  As directed    Patient will be discharged to home.  Patient will need to follow up with primary care provider within one week of discharge, repeat CBC. Follow up with Dr. Edyth GunnelsBaron, gastroenterologist, in 2 months. Patient should continue medications as prescribed.  Patient should follow a soft diet.     Allergies as of 05/30/2018      Reactions   Lisinopril Swelling    facial angioedema      Medication List    STOP taking these medications   HYDROcodone-acetaminophen 5-325 MG tablet Commonly known as:  NORCO/VICODIN     TAKE these medications   amoxicillin-clavulanate 875-125 MG tablet Commonly known as:  AUGMENTIN Take 1 tablet by mouth 2 (two) times daily for 4 doses.   ferrous sulfate 325 (65 FE) MG EC tablet Take 1 tablet (325 mg total) by mouth 2 (two) times daily.   metoprolol tartrate 25 MG tablet Commonly known as:  LOPRESSOR Take 0.5 tablets (12.5 mg total) by mouth daily as needed (rapid heart rate/ high blood pressure). 1/4 tablet as needed   omeprazole 20 MG capsule Commonly known as:  PRILOSEC 1 cap by mouth once daily 1/2 hour before meals   ondansetron 4 MG disintegrating tablet Commonly known as:  ZOFRAN ODT Take 1 tablet (4 mg total) by mouth every 8 (eight) hours as needed for nausea or vomiting.   vitamin B-12 1000 MCG tablet Commonly known as:  CYANOCOBALAMIN Take 1 tablet (1,000 mcg total)  by mouth daily.      Allergies  Allergen Reactions  . Lisinopril Swelling     facial angioedema   Follow-up Information    Nichole MansonMulberry, Elizabeth, MD. Schedule an appointment as soon as possible for a visit in 1 week(s).   Specialty:  Internal Medicine Why:  Hospital follow up Contact information: [address] [place] [address] [phone]        Ned GraceBaron, Todd H, MD. Schedule an appointment as soon as possible for a visit in 2 month(s).   Specialty:  Psychiatry Why:  Hospital follwo up Contact information: [id number] [facility] [place] [address] [phone]            The results of significant diagnostics from this hospitalization (including imaging, microbiology, ancillary and laboratory) are listed below for reference.    Significant Diagnostic Studies: Ct Abdomen Pelvis W Contrast  Result Date: 05/26/2018 CLINICAL DATA:  History of chronic pancreatitis and large pancreatic pseudocyst. Epigastric abdominal pain. EXAM: CT ABDOMEN AND PELVIS WITH CONTRAST TECHNIQUE: Multidetector CT imaging of the abdomen and pelvis was performed using the standard protocol following bolus administration of intravenous contrast. CONTRAST:  100mL ISOVUE-300 IOPAMIDOL (ISOVUE-300) INJECTION 61% COMPARISON:  01/10/2018 chest CT.  07/29/2016 CT abdomen/pelvis. FINDINGS: Lower chest: No significant pulmonary nodules or acute consolidative airspace disease. There is circumferential wall thickening in the lower thoracic esophagus, increased from prior chest CT. There is an irregular gas and fluid containing mass in the right lower paraesophageal mediastinum measuring 7.7 x  5.3 cm in axial dimensions (series 2/image 4), previously 10.3 x 5.3 cm on 01/10/2018 chest CT, demonstrating new wall thickening and hyperenhancement and new internal gas, potentially fistulized to the lower thoracic esophagus on series 2/image 2. This mass is directly contiguous with the  posterior pancreatic head mass. Hepatobiliary: Normal liver size. No liver mass. Normal gallbladder with no radiopaque cholelithiasis. No significant intrahepatic biliary ductal dilatation. CBD diameter 6 mm, top-normal. Pancreas: Coarse calcifications and parenchymal atrophy throughout the pancreas with irregular pancreatic duct dilation up to 5 mm diameter, compatible with chronic pancreatitis, unchanged from prior CT studies. There is a 7.5 x 5.9 cm mass in the posterior pancreatic head (series 2/image 25), significantly increased from 6.1 x 4.5 cm on 02/27/2016 CT abdomen study, with increasing wall thickness and wall hyperenhancement and with new internal gas, directly contiguous with the lower right paraesophageal mediastinal mass. The superior extent of this mass is not completely delineated on this CT, with a craniocaudal dimension at least 17.4 cm. Spleen: Normal size. No mass. Adrenals/Urinary Tract: Normal adrenals. Normal kidneys with no hydronephrosis and no renal mass. Normal bladder. Stomach/Bowel: Normal non-distended stomach. Normal caliber small bowel with no small bowel wall thickening. Normal appendix. Normal large bowel with no diverticulosis, large bowel wall thickening or pericolonic fat stranding. Vascular/Lymphatic: Atherosclerotic nonaneurysmal abdominal aorta. Patent portal, splenic, hepatic and renal veins. No pathologically enlarged lymph nodes in the abdomen or pelvis. Reproductive: Grossly normal uterus.  No adnexal mass. Other: No pneumoperitoneum.  No ascites. Musculoskeletal: No aggressive appearing focal osseous lesions. IMPRESSION: 1. Large chronic pancreatic pseudocyst in the posterior pancreatic head region extends superiorly into the right paraesophageal lower mediastinum, has increased in size since 2017, and demonstrates new internal gas and increased wall thickening and hyperenhancement, likely indicating fistulization to the GI tract with associated infection of the  pseudocyst, with the GI tract fistulization potentially located at the level of the lower thoracic esophagus. Please note that the superior extent of this pseudocyst is not delineated on this scan, and a dedicated chest CT with IV contrast may be considered for complete evaluation. 2. Chronic pancreatitis. 3. Increased circumferential wall thickening in the lower thoracic esophagus, nonspecific, probably inflammatory. Consider upper endoscopic correlation. 4.  Aortic Atherosclerosis (ICD10-I70.0). Electronically Signed   By: Delbert Phenix M.D.   On: 05/26/2018 19:06    Microbiology: No results found for this or any previous visit (from the past 240 hour(s)).   Labs: Basic Metabolic Panel: Recent Labs  Lab 05/26/18 1448 05/27/18 0416 05/28/18 0428 05/29/18 0421  NA 137 138 139 138  K 4.6 3.6 3.4* 3.9  CL 97* 109 109 109  CO2 25 22 22 23   GLUCOSE 150* 212* 193* 270*  BUN 21* 12 9 10   CREATININE 0.57 0.57 0.57 0.51  CALCIUM 9.4 8.1* 8.2* 7.9*   Liver Function Tests: Recent Labs  Lab 05/26/18 1448 05/27/18 0416 05/28/18 0428 05/29/18 0421  AST 31 24 25 19   ALT 20 16 19 13   ALKPHOS 100 72 72 58  BILITOT 1.5* 1.0 1.6* 0.7  PROT 8.4* 6.1* 6.0* 4.8*  ALBUMIN 2.7* 2.0* 2.0* 1.5*   Recent Labs  Lab 05/26/18 1448  LIPASE 29   No results for input(s): AMMONIA in the last 168 hours. CBC: Recent Labs  Lab 05/26/18 1448 05/27/18 0416 05/27/18 0908 05/27/18 1722 05/28/18 0428 05/29/18 0421  WBC 12.7* 11.3*  --   --  11.4* 9.5  HGB 9.7* 7.6* 6.9* 10.0* 9.8* 9.0*  HCT 30.1* 23.6*  21.6* 29.7* 30.2* 27.2*  MCV 100.3* 100.0  --   --  97.1 96.8  PLT 570* 396  --   --  333 287   Cardiac Enzymes: No results for input(s): CKTOTAL, CKMB, CKMBINDEX, TROPONINI in the last 168 hours. BNP: BNP (last 3 results) No results for input(s): BNP in the last 8760 hours.  ProBNP (last 3 results) No results for input(s): PROBNP in the last 8760 hours.  CBG: No results for input(s): GLUCAP  in the last 168 hours.     Signed:  Edsel Petrin  [facility] 05/30/2018, 10:14 AM

## 2018-05-30 NOTE — Discharge Instructions (Signed)
Chronic Pancreatitis Chronic pancreatitis is long-lasting inflammation and scarring of the pancreas. The pancreas is a gland that is located behind the stomach. It produces enzymes that help to digest food. The pancreas also releases the hormones glucagon and insulin, which help to regulate blood sugar. Damage to the pancreas may affect digestion, cause pain in the upper abdomen and back, and cause diabetes. Inflammation can also irritate other abdominal organs near the pancreas. At the very beginning, pancreatitis may be sudden (acute). If acute pancreatitis is not caught in time or treated effectively, or if you have several or prolonged episodes of acute pancreatitis, then the condition can turn into chronic pancreatitis. What are the causes? The most common cause of this condition is alcohol abuse. Other causes include:  High levels of triglycerides in the blood (hypertriglyceridemia).  Gallstones or other conditions that can block the tube that drains the pancreas (pancreatic duct).  Pancreatic cancer.  Cystic fibrosis.  Too much calcium in the blood (hypercalcemia), which may be caused by an overactive parathyroid gland (hyperparathyroidism).  Certain medicines.  Injury to the pancreas.  Infection.  Autoimmune pancreatitis. This is when the body's disease-fighting (immune) system attacks the pancreas.  Genes that are passed along from parent to child (inherited).  In some cases, the cause may not be known. What increases the risk? This condition is more likely to develop in:  Men.  People who are 55-7 years old.  What are the signs or symptoms? Symptoms of this condition may include:  Abdominal pain. Pain may also be felt in the upper back and may get worse after eating.  Nausea and vomiting.  Fever.  Weight loss.  A change in the color and consistency of bowel movements, such as diarrhea.  How is this diagnosed? This condition is diagnosed based on your  symptoms, your medical history, and a physical exam. You may have tests, such as:  Blood tests.  Stool samples.  Biopsy of the pancreas. This is the removal of a small amount of pancreas tissue to be tested in a lab.  Imaging studies, such as: ? X-rays. ? CT scan. ? MRI. ? Ultrasound.  How is this treated? The goal of treatment is to help relieve symptoms and to prevent complications from occurring. Treatment focuses on:  Resting the pancreas. You may need to stop eating and drinking for a few days while in the hospital to give your pancreas time to recover. During this time, you will be given IV fluids to keep you hydrated.  Controlling pain. You may be given pain medicines by mouth (orally) or as injections.  Improving digestion. You may be given: ? Medicines to help balance your enzymes. ? Vitamin supplements. ? A specific diet to follow. If you are given a diet, you may work with a specialist (dietitian).  Preventing diabetes. You may need insulin injections.  You may have surgery to:  Clear the pancreatic ducts of any blockages, such as gallstones.  Remove any fluid or damaged tissue from the pancreas.  Follow these instructions at home:  Take over-the-counter and prescription medicines only as told by your health care provider. This includes any vitamin supplements.  Do not drive or operate heavy machinery while taking prescription pain medicines.  Drink enough fluid to keep your urine clear or pale yellow.  Do not drink alcohol. If you need help quitting, ask your health care provider.  Do not use any tobacco products, such as cigarettes, chewing tobacco, and e-cigarettes. If you need help  quitting, ask your health care provider.  Follow a diet as told by your health care provider or dietitian, if this applies. This may include: ? Limiting how much fat you eat. ? Eating smaller meals more often. ? Avoiding caffeine.  Keep all follow-up visits as told by your  health care provider. This is important. Contact a health care provider if:  You have pain that does not get better with medicine.  You have a fever. Get help right away if:  Your pain suddenly gets worse.  You have sudden abdominal swelling.  You start to vomit often or you vomit blood.  You have diarrhea that does not go away.  You have blood in your stool. This information is not intended to replace advice given to you by your health care provider. Make sure you discuss any questions you have with your health care provider. Document Released: 12/14/2015 Document Revised: 04/24/2016 Document Reviewed: 10/25/2014 Elsevier Interactive Patient Education  2018 [employer]   BigelowBland Diet A bland diet consists of foods that do not have a lot of fat or fiber. Foods without fat or fiber are easier for the body to digest. They are also less likely to irritate your mouth, throat, stomach, and other parts of your gastrointestinal tract. A bland diet is sometimes called a BRAT diet. What is my plan? Your health care provider or dietitian may recommend specific changes to your diet to prevent and treat your symptoms, such as:  Eating small meals often.  Cooking food until it is soft enough to chew easily.  Chewing your food well.  Drinking fluids slowly.  Not eating foods that are very spicy, sour, or fatty.  Not eating citrus fruits, such as oranges and grapefruit.  What do I need to know about this diet?  Eat a variety of foods from the bland diet food list.  Do not follow a bland diet longer than you have to.  Ask your health care provider whether you should take vitamins. What foods can I eat? Grains  Hot cereals, such as cream of wheat. Bread, crackers, or tortillas made from refined white flour. Rice. Vegetables Canned or cooked vegetables. Mashed or boiled potatoes. Fruits Bananas. Applesauce. Other types of cooked or canned fruit with the skin and seeds removed,  such as canned peaches or pears. Meats and Other Protein Sources Scrambled eggs. Creamy peanut butter or other nut butters. Lean, well-cooked meats, such as chicken or fish. Tofu. Soups or broths. Dairy Low-fat dairy products, such as milk, cottage cheese, or yogurt. Beverages Water. Herbal tea. Apple juice. Sweets and Desserts Pudding. Custard. Fruit gelatin. Ice cream. Fats and Oils Mild salad dressings. Canola or olive oil. The items listed above may not be a complete list of allowed foods or beverages. Contact your dietitian for more options. What foods are not recommended? Foods and ingredients that are often not recommended include:  Spicy foods, such as hot sauce or salsa.  Fried foods.  Sour foods, such as pickled or fermented foods.  Raw vegetables or fruits, especially citrus or berries.  Caffeinated drinks.  Alcohol.  Strongly flavored seasonings or condiments.  The items listed above may not be a complete list of foods and beverages that are not allowed. Contact your dietitian for more information. This information is not intended to replace advice given to you by your health care provider. Make sure you discuss any questions you have with your health care provider. Document Released: 03/10/2016 Document Revised: 04/24/2016 Document Reviewed: 11/29/2014  Chartered certified accountant Patient Education  [employer].

## 2018-05-31 ENCOUNTER — Ambulatory Visit: Payer: Self-pay | Admitting: Internal Medicine

## 2018-06-02 ENCOUNTER — Other Ambulatory Visit: Payer: Self-pay

## 2018-06-02 DIAGNOSIS — K863 Pseudocyst of pancreas: Secondary | ICD-10-CM

## 2018-06-02 DIAGNOSIS — R1013 Epigastric pain: Secondary | ICD-10-CM

## 2018-06-03 LAB — COMPREHENSIVE METABOLIC PANEL
A/G RATIO: 0.8 — AB (ref 1.2–2.2)
ALK PHOS: 92 IU/L (ref 39–117)
ALT: 28 IU/L (ref 0–32)
AST: 40 IU/L (ref 0–40)
Albumin: 2.6 g/dL — ABNORMAL LOW (ref 3.5–5.5)
BUN / CREAT RATIO: 14 (ref 9–23)
BUN: 7 mg/dL (ref 6–24)
Bilirubin Total: 0.3 mg/dL (ref 0.0–1.2)
CALCIUM: 8.4 mg/dL — AB (ref 8.7–10.2)
CO2: 25 mmol/L (ref 20–29)
Chloride: 103 mmol/L (ref 96–106)
Creatinine, Ser: 0.51 mg/dL — ABNORMAL LOW (ref 0.57–1.00)
GFR calc non Af Amer: 109 mL/min/{1.73_m2} (ref >59)
GFR, EST AFRICAN AMERICAN: 125 mL/min/{1.73_m2} (ref >59)
GLOBULIN, TOTAL: 3.4 g/dL (ref 1.5–4.5)
Glucose: 114 mg/dL — ABNORMAL HIGH (ref 65–99)
POTASSIUM: 4.1 mmol/L (ref 3.5–5.2)
SODIUM: 143 mmol/L (ref 134–144)
Total Protein: 6 g/dL (ref 6.0–8.5)

## 2018-06-03 LAB — CBC WITH DIFFERENTIAL/PLATELET
Basophils Absolute: 0 10*3/uL (ref 0.0–0.2)
Basos: 0 %
EOS (ABSOLUTE): 0.1 10*3/uL (ref 0.0–0.4)
EOS: 1 %
HEMATOCRIT: 31 % — AB (ref 34.0–46.6)
Hemoglobin: 9.9 g/dL — ABNORMAL LOW (ref 11.1–15.9)
IMMATURE GRANULOCYTES: 3 %
Immature Grans (Abs): 0.3 10*3/uL — ABNORMAL HIGH (ref 0.0–0.1)
LYMPHS ABS: 0.9 10*3/uL (ref 0.7–3.1)
Lymphs: 10 %
MCH: 31.3 pg (ref 26.6–33.0)
MCHC: 31.9 g/dL (ref 31.5–35.7)
MCV: 98 fL — ABNORMAL HIGH (ref 79–97)
MONOS ABS: 0.4 10*3/uL (ref 0.1–0.9)
Monocytes: 5 %
NEUTROS PCT: 81 %
Neutrophils Absolute: 7.1 10*3/uL — ABNORMAL HIGH (ref 1.4–7.0)
PLATELETS: 271 10*3/uL (ref 150–450)
RBC: 3.16 x10E6/uL — AB (ref 3.77–5.28)
RDW: 14.6 % (ref 12.3–15.4)
WBC: 8.7 10*3/uL (ref 3.4–10.8)

## 2018-06-04 ENCOUNTER — Ambulatory Visit: Payer: Self-pay | Admitting: Internal Medicine

## 2018-06-04 VITALS — BP 110/70 | HR 90 | Resp 12 | Ht 59.0 in | Wt 102.0 lb

## 2018-06-04 DIAGNOSIS — K861 Other chronic pancreatitis: Secondary | ICD-10-CM

## 2018-06-04 DIAGNOSIS — D649 Anemia, unspecified: Secondary | ICD-10-CM

## 2018-06-04 DIAGNOSIS — K863 Pseudocyst of pancreas: Secondary | ICD-10-CM

## 2018-06-04 DIAGNOSIS — R609 Edema, unspecified: Secondary | ICD-10-CM

## 2018-06-04 MED ORDER — AMOXICILLIN-POT CLAVULANATE 875-125 MG PO TABS: ORAL_TABLET | ORAL | 0 refills | Status: DC

## 2018-06-04 NOTE — Patient Instructions (Signed)
Take ferrous sulfate 325 mg twice daily

## 2018-06-04 NOTE — Progress Notes (Signed)
   Subjective:    Patient ID: Nichole [name], female    DOB: 1961/11/07, 57 y.o.   MRN: [id number]  HPI   Pancreatic pseudocyst: had first decompressed with stenting endoscopically through stomach in April.  Did well at first with resolution of pain. She underwent removal of stent end of May. 6/26, developed pain and fever.  New pseudocyst formed in head of pancreas.  Hospitalized and transferred on the 28th [facility] where she underwent stenting through duodem.  Purulent fluid aspirated with placement of stent.  She is not taking the Augmentin as she did not get out of the house until today. She is feeling better, but she is still having swelling of bilateral legs (started in hospital) No definite fever. Denies any alcohol intake.  Had elevated blood glucose when ill on 05/29/18 at 270.  Down to 114 with labs drawn two days ago. WBCs 8.7 with mild increase in Neutrophils.  Anemia improved to hemoglobin of 9.9.  Her low was 6.9 and received PRBCs in hospital.  hgb just before discharge was 9.0.  She is eating somewhat.  Current Meds  Medication Sig  . metoprolol tartrate (LOPRESSOR) 25 MG tablet Take 0.5 tablets (12.5 mg total) by mouth daily as needed (rapid heart rate/ high blood pressure). 1/4 tablet as needed   Allergies  Allergen Reactions  . Lisinopril Swelling     facial angioedema    Review of Systems     Objective:   Physical Exam NAD Moves stiffly stating legs heavy Lungs:  CTA CV:  RRR without murmur or rub, radial and DP pulses normal and equal Abd:  S, + BS, mild pain in RUQ, no rebound or peritoneal signs.  No HSM or mass. LE:  Moderate edema to above knees.       Assessment & Plan:  1.  Recurrent pancreatic pseudocyst:  Second cyst with infection.  She did not finish her course of Augmentin.  WBC normal.  Encouraged her to complete, though not clear will make a significant difference at this late date. Discussed in future, if she knows she will have  difficulties filling med, to let provider know up front.  2.  Anemia:  Apparent blood loss with recent episode of pancreatic pseudocyst formation and stenting.  Hgb on upward trend.  Encouraged her to take Ferrous sulfate 325 mg twice daily with half and orange.  Discussed her anemia could be adding to her edema  3.  Peripheral edema:  Likely due to decreased intravascular protein status/oncotic pressure.  Iron supplementation to improve hemoglobin.  Encouraged also to move away from all the granola bars, Ensure and other processed foods that may also have a lot of sodium.  Encouraged eating regular protein sources and check labels for sodium.  Follow up in 1 month

## 2018-06-09 ENCOUNTER — Telehealth: Payer: Self-pay | Admitting: Internal Medicine

## 2018-06-09 NOTE — Telephone Encounter (Signed)
error 

## 2018-06-14 ENCOUNTER — Telehealth: Payer: Self-pay | Admitting: Internal Medicine

## 2018-06-14 NOTE — Telephone Encounter (Signed)
Patient called last week asking for  A new work letter with less detail. Patient states she did not want employer to know detailed information. Per Dr. Delrae AlfredMulberry she will rewrite her letter but has found that most employers are seeking more information in work notes.  To Dr. Delrae AlfredMulberry for further direction

## 2018-06-14 NOTE — Telephone Encounter (Signed)
Letter rewritten and given to [employer] to notify for pick up.

## 2018-06-14 NOTE — Telephone Encounter (Signed)
Patient called needs updated letter to her employer.  Patient to return to work on Tuesday.  Patient can be reached at [phone].

## 2018-07-06 ENCOUNTER — Encounter: Payer: Self-pay | Admitting: Internal Medicine

## 2018-07-06 ENCOUNTER — Ambulatory Visit: Payer: Self-pay | Admitting: Internal Medicine

## 2018-07-06 VITALS — BP 130/80 | HR 90 | Resp 12 | Ht 59.0 in | Wt 95.0 lb

## 2018-07-06 DIAGNOSIS — K863 Pseudocyst of pancreas: Secondary | ICD-10-CM

## 2018-07-06 DIAGNOSIS — R739 Hyperglycemia, unspecified: Secondary | ICD-10-CM

## 2018-07-06 DIAGNOSIS — F17200 Nicotine dependence, unspecified, uncomplicated: Secondary | ICD-10-CM

## 2018-07-06 DIAGNOSIS — R627 Adult failure to thrive: Secondary | ICD-10-CM

## 2018-07-06 DIAGNOSIS — D518 Other vitamin B12 deficiency anemias: Secondary | ICD-10-CM

## 2018-07-06 DIAGNOSIS — R609 Edema, unspecified: Secondary | ICD-10-CM

## 2018-07-06 DIAGNOSIS — R202 Paresthesia of skin: Secondary | ICD-10-CM

## 2018-07-06 DIAGNOSIS — K860 Alcohol-induced chronic pancreatitis: Secondary | ICD-10-CM

## 2018-07-06 DIAGNOSIS — D539 Nutritional anemia, unspecified: Secondary | ICD-10-CM

## 2018-07-06 DIAGNOSIS — R200 Anesthesia of skin: Secondary | ICD-10-CM | POA: Insufficient documentation

## 2018-07-06 NOTE — Progress Notes (Signed)
Subjective:    Patient ID: Nichole [name], female    DOB: [birth year], 57 y.o.   MRN: [id number]  HPI  1.  Infected recurrent pseudocyst:  Finished course of Augmentin and about 2 weeks after last seen started feeling much better. Now without any pain with eating.   She is concerned as she is not gaining weight. When last seen, she was also having significant edema.   After her first pseudocyst stenting and before formation of edema, she was weighing in the 90s then as well.  Patient down to 95 lbs.  Weighed 102 with edema at last visit about 1 month ago. Her appetite is good as is her energy.   Denies floating, greasy or frothy stools. She is eating a lot of junk food--later states she is not. Has not needed anything for nausea or for reflux.    2.  Edema:  Resolved over time with and cannot say exactly when, but about the time her pain was resolved.    3.  Anemia:  Continues to take B12.  Did not continue the iron and states could not find what she thought she was supposed to take.    4.  Tobacco:  No interest in quitting at this time despite recurrent counseling.  5.  Social:  She is stable in her home.  Does spend a lot of time alone, however. Still in touch with ex partner.  She is generally supportive. Continues with counseling at [facility] with ?Dorissa  6.  Hyperglycemia:  Was eating a lot of candy previously with her abdominal discomfort.  She states she is not doing that so much now.    Current Meds  Medication Sig  . metoprolol tartrate (LOPRESSOR) 25 MG tablet Take 0.5 tablets (12.5 mg total) by mouth daily as needed (rapid heart rate/ high blood pressure). 1/4 tablet as needed  . vitamin B-12 (CYANOCOBALAMIN) 1000 MCG tablet Take 1 tablet (1,000 mcg total) by mouth daily.   Allergies  Allergen Reactions  . Lisinopril Swelling     facial angioedema   Review of Systems     Objective:   Physical Exam  NAD Looks well and much happier Lungs:  CTA CV: RRR  without murmur or rub, radial pulses normal and equal Abd:  Minimal epigastric tenderness without rebound or peritoneal signs.  No HSM or mass, + BS LE:  No edema. Neuro: A & O x 3, CN  II-XII grossly intact, DTRs 2+/4 Motor 5/5 throughout. Decreased sensation to light touch compared to left throughout face, posterior thorax, though not anterior thorax.  Decreased over left arm, both medial and lateral.  Not so clear of a difference on leg, both medial and Lateral. Normal rapid alternating motion, gait.  Negative Romberg     Assessment & Plan:  1.  As leaving, states her entire left side went numb and has stayed numb since a week ago.  May have had some left sided weakness in her left leg the first night.  Was sitting watching TV when occurred.  Has not worsened and no other symptoms have developed.  Performed neuro exam with decreased light touch sensation on left. MRI of brain ordered, will see if can expedite through [facility]  2.  Chronic pancreatitis with recurrent pseudocyst formation and pseudocyst infection.  Appears to be finally moving in right direction. Follow.  CMP  3.  Edema:  Resolved  4.  Anemia: encouraged her to take B12 and iron supplementation.   Check  CBC  5.  Social:  Stabilizing.  6.   Hyperglycemia: [facility] to get healthier weight on her.   CMP today as well.  7.  Tobacco abuse:  No interest in quitting at this time.  Discussed options.

## 2018-07-07 LAB — COMPREHENSIVE METABOLIC PANEL
A/G RATIO: 1.2 (ref 1.2–2.2)
ALBUMIN: 4.6 g/dL (ref 3.5–5.5)
ALT: 6 IU/L (ref 0–32)
AST: 13 IU/L (ref 0–40)
Alkaline Phosphatase: 101 IU/L (ref 39–117)
BUN/Creatinine Ratio: 22 (ref 9–23)
BUN: 13 mg/dL (ref 6–24)
Bilirubin Total: 0.3 mg/dL (ref 0.0–1.2)
CALCIUM: 10 mg/dL (ref 8.7–10.2)
CO2: 22 mmol/L (ref 20–29)
CREATININE: 0.58 mg/dL (ref 0.57–1.00)
Chloride: 101 mmol/L (ref 96–106)
GFR, EST AFRICAN AMERICAN: 120 mL/min/{1.73_m2} (ref >59)
GFR, EST NON AFRICAN AMERICAN: 104 mL/min/{1.73_m2} (ref >59)
GLOBULIN, TOTAL: 3.7 g/dL (ref 1.5–4.5)
Glucose: 91 mg/dL (ref 65–99)
POTASSIUM: 4.8 mmol/L (ref 3.5–5.2)
SODIUM: 141 mmol/L (ref 134–144)
TOTAL PROTEIN: 8.3 g/dL (ref 6.0–8.5)

## 2018-07-07 LAB — CBC WITH DIFFERENTIAL/PLATELET
BASOS: 1 %
Basophils Absolute: 0 10*3/uL (ref 0.0–0.2)
EOS (ABSOLUTE): 0.1 10*3/uL (ref 0.0–0.4)
EOS: 2 %
HEMATOCRIT: 38.4 % (ref 34.0–46.6)
Hemoglobin: 12.5 g/dL (ref 11.1–15.9)
IMMATURE GRANS (ABS): 0 10*3/uL (ref 0.0–0.1)
Immature Granulocytes: 1 %
LYMPHS: 27 %
Lymphocytes Absolute: 1.1 10*3/uL (ref 0.7–3.1)
MCH: 32.8 pg (ref 26.6–33.0)
MCHC: 32.6 g/dL (ref 31.5–35.7)
MCV: 101 fL — ABNORMAL HIGH (ref 79–97)
MONOS ABS: 0.3 10*3/uL (ref 0.1–0.9)
Monocytes: 7 %
NEUTROS ABS: 2.6 10*3/uL (ref 1.4–7.0)
Neutrophils: 62 %
PLATELETS: 259 10*3/uL (ref 150–450)
RBC: 3.81 x10E6/uL (ref 3.77–5.28)
RDW: 16.9 % — ABNORMAL HIGH (ref 12.3–15.4)
WBC: 4.1 10*3/uL (ref 3.4–10.8)

## 2018-07-17 ENCOUNTER — Emergency Department (HOSPITAL_COMMUNITY)
Admission: EM | Admit: 2018-07-17 | Discharge: 2018-07-18 | Disposition: A | Discharge status: Home / Self Care | Payer: Self-pay | Attending: Emergency Medicine | Admitting: Emergency Medicine

## 2018-07-17 ENCOUNTER — Other Ambulatory Visit: Payer: Self-pay

## 2018-07-17 ENCOUNTER — Encounter (HOSPITAL_COMMUNITY): Payer: Self-pay | Admitting: Emergency Medicine

## 2018-07-17 DIAGNOSIS — R1012 Left upper quadrant pain: Secondary | ICD-10-CM | POA: Insufficient documentation

## 2018-07-17 DIAGNOSIS — Z79899 Other long term (current) drug therapy: Secondary | ICD-10-CM | POA: Insufficient documentation

## 2018-07-17 DIAGNOSIS — K859 Acute pancreatitis without necrosis or infection, unspecified: Secondary | ICD-10-CM

## 2018-07-17 DIAGNOSIS — K861 Other chronic pancreatitis: Secondary | ICD-10-CM

## 2018-07-17 DIAGNOSIS — F17210 Nicotine dependence, cigarettes, uncomplicated: Secondary | ICD-10-CM | POA: Insufficient documentation

## 2018-07-17 LAB — URINALYSIS, ROUTINE W REFLEX MICROSCOPIC
GLUCOSE, UA: NEGATIVE mg/dL
HGB URINE DIPSTICK: NEGATIVE
Ketones, ur: 80 mg/dL — AB
LEUKOCYTES UA: NEGATIVE
NITRITE: NEGATIVE
PH: 5 (ref 5.0–8.0)
Specific Gravity, Urine: 1.033 — ABNORMAL HIGH (ref 1.005–1.030)

## 2018-07-17 LAB — CBC WITH DIFFERENTIAL/PLATELET
BASOS ABS: 0 10*3/uL (ref 0.0–0.1)
Basophils Relative: 0 %
EOS ABS: 0 10*3/uL (ref 0.0–0.7)
Eosinophils Relative: 0 %
HCT: 41.1 % (ref 36.0–46.0)
HEMOGLOBIN: 13.7 g/dL (ref 12.0–15.0)
LYMPHS ABS: 0.5 10*3/uL — AB (ref 0.7–4.0)
Lymphocytes Relative: 5 %
MCH: 33.1 pg (ref 26.0–34.0)
MCHC: 33.3 g/dL (ref 30.0–36.0)
MCV: 99.3 fL (ref 78.0–100.0)
Monocytes Absolute: 0.2 10*3/uL (ref 0.1–1.0)
Monocytes Relative: 2 %
NEUTROS PCT: 93 %
Neutro Abs: 9.2 10*3/uL — ABNORMAL HIGH (ref 1.7–7.7)
PLATELETS: 257 10*3/uL (ref 150–400)
RBC: 4.14 MIL/uL (ref 3.87–5.11)
RDW: 15.6 % — ABNORMAL HIGH (ref 11.5–15.5)
WBC: 10 10*3/uL (ref 4.0–10.5)

## 2018-07-17 MED ORDER — IOPAMIDOL (ISOVUE-300) INJECTION 61%
INTRAVENOUS | Status: AC
  Filled 2018-07-17: qty 100

## 2018-07-17 MED ORDER — ONDANSETRON HCL 4 MG/2ML IJ SOLN: 4.0000 mg | Freq: Once | INTRAMUSCULAR | Status: DC

## 2018-07-17 MED ORDER — SODIUM CHLORIDE 0.9 % IV BOLUS
1000.0000 mL | Freq: Once | INTRAVENOUS | Status: AC
  Administered 2018-07-18: 1000 mL via INTRAVENOUS

## 2018-07-17 MED ORDER — MORPHINE SULFATE (PF) 4 MG/ML IV SOLN
4.0000 mg | Freq: Once | INTRAVENOUS | Status: AC
  Administered 2018-07-17: 4 mg via INTRAVENOUS
  Filled 2018-07-17: qty 1

## 2018-07-17 NOTE — ED Provider Notes (Signed)
[facility]   CSN: [id number] Arrival date & time: 07/17/18  2102     History   Chief Complaint Chief Complaint  Patient presents with  . Abdominal Pain    HPI Nichole [name] is a 57 y.o. female with PMH/o HTN, Pancreatic pseudocyst status post drainage and stent placement in May 2019 who presents for evaluation of 1 week of progressively worsening abdominal pain, decreased appetite.  Patient reports a 1 week ago, she started having some abdominal pain in the left upper quadrant.  Patient reports that over the weeks, it became less severe and more generalized than stated that about 2 to 3 days ago, the pain got acutely worse.  She states that pain now is more focalized to the left upper quadrant and radiates around to her back.  She states she has not been able to eat and drink much over the last 3 days.  She states she tried to eat some broth but it caused worsening pain.  She reports nausea but no vomiting.  She states that she does not know when her last bowel movement was.  Patient reports that she was recently [facility] for pain control and management of her symptoms.  She was discharged with plans to follow-up with her GI doctor but she states she never saw them.  Patient denies any fevers, chest pain, difficulty breathing, dysuria, hematuria.  The history is provided by the patient.    Past Medical History:  Diagnosis Date  . Acute pancreatitis 12/05/2010, 01/2016   with complex pseudocyst, attributed to ETOH.  2017: pseudocyst, acute on chronic pancreatitis. pancreatic calcifications.  . ANEMIA, B12 DEFICIENCY 04/05/2009   macrocytic  . EXCESSIVE MENSTRUAL BLEEDING 03/18/2010   uterine fibroids. gyn eval and bx neg  . Hematemesis 01/2016   in setting pancreatitispseudocyst.  . HYPERTENSION 01/29/2008  . LEG PAIN, BILATERAL 06/22/2008  . Palpitations 01/02/2009   States heart rate has been up to 180, though states has  never been seen when having fast heart rate.  Marland Kitchen. PERIMENOPAUSAL SYNDROME 08/23/2009  . RASH 12/23/2010  . 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 SYNCOPE 01/02/2009  . THYROID FUNCTION TEST, ABNORMAL 03/19/2009  . TOBACCO USE 01/25/2008  . WEIGHT LOSS-ABNORMAL 12/05/2010    Patient Active Problem List   Diagnosis Date Noted  . Numbness and tingling of left upper and lower extremity 07/06/2018  . Chronic pancreatitis (HCC) 05/26/2018  . 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 Anxiety and depression 07/17/2017  . Pseudocyst of pancreas   . Hematemesis   . Esophageal stricture   . Acute pancreatitis 02/27/2016  . Alcohol abuse 02/27/2016  . Does not have health insurance 06/06/2014  . History of chest pain at rest 06/06/2014  . Ganglion cyst of wrist 06/06/2014  . Visit for preventive health examination 02/22/2013  . Change in bowel habits 02/22/2013  . History of positive PPD 02/22/2013  . Neuropathy 08/26/2012  . Numbness and tingling of both legs below knees 08/26/2012  . Anterior leg pain 05/13/2012  . Edema 05/13/2012  . Pseudocyst, pancreas 02/22/2011  . GASTROINTESTINAL XRAY, ABNORMAL 12/16/2010  . Weight loss 12/05/2010  . Abdominal pain 12/03/2010  . SWEATING 09/23/2010  . Deficiency anemia 08/23/2009  . PERIMENOPAUSAL SYNDROME 08/23/2009  . ANEMIA, B12 DEFICIENCY 04/05/2009  . THYROID FUNCTION TEST, ABNORMAL 03/19/2009  . SYNCOPE 01/02/2009  . PALPITATIONS 01/02/2009  . LEG PAIN, BILATERAL 06/22/2008  .  UNSPECIFIED ANEMIA 02/14/2008  . Essential hypertension 01/29/2008  . TOBACCO USE 01/25/2008    Past Surgical History:  Procedure Laterality Date  . ESOPHAGOGASTRODUODENOSCOPY (EGD) WITH PROPOFOL N/A 02/28/2016   Procedure: ESOPHAGOGASTRODUODENOSCOPY (EGD) WITH PROPOFOL;  Surgeon: Napoleon Form, MD;  Location: [facility] ENDOSCOPY;  Service: Endoscopy;  Laterality: N/A;  . KNEE SURGERY Left 1998   Arthroscopic--for  torn meniscus     OB History    Gravida  0   Para  0   Term  0   Preterm  0   AB  0   Living  0     SAB  0   TAB  0   Ectopic  0   Multiple  0   Live Births               Home Medications    Prior to Admission medications   Medication Sig Start Date End Date Taking? Authorizing Provider  ferrous sulfate 325 (65 FE) MG EC tablet Take 1 tablet (325 mg total) by mouth 2 (two) times daily. Patient not taking: Reported on 06/04/2018 05/30/18 05/30/19  Edsel Petrin, DO  metoprolol tartrate (LOPRESSOR) 25 MG tablet Take 0.5 tablets (12.5 mg total) by mouth daily as needed (rapid heart rate/ high blood pressure). 1/4 tablet as needed 01/19/18   Julieanne Manson, MD  vitamin B-12 (CYANOCOBALAMIN) 1000 MCG tablet Take 1 tablet (1,000 mcg total) by mouth daily. 07/24/17   Julieanne Manson, MD    Family History Family History  Problem Relation Age of Onset  . Hypertension Mother   . Kidney disease Mother        removed for cyst--not clear if this was a malignancy or not  . Other Mother        overweight--lost a lot following diagnosis of diabetes  . Diabetes Mother   . Heart attack Father   . Stroke Father        died age 43 yo--had multiple TIAs  . Muscular dystrophy Father   . Hypertension Brother   . Neuromuscular disorder Unknown        spinomuscular atrophy    Social History Social History   Tobacco Use  . Smoking status: Current Every Day Smoker    Packs/day: 0.50    Years: 36.00    Pack years: 18.00    Types: Cigarettes  . Smokeless tobacco: Never Used  . Tobacco comment: Not ready to quit.  Does not want patches or other medication support.  Not a "medication"  person.    Substance Use Topics  . Alcohol use: Not Currently    Comment: States none since her first stenting spring of 2019.  . Drug use: No     Allergies   Lisinopril   Review of Systems Review of Systems  Constitutional: Positive for appetite change. Negative for fever.    Respiratory: Negative for cough and shortness of breath.   Cardiovascular: Negative for chest pain.  Gastrointestinal: Positive for abdominal pain and nausea. Negative for vomiting.  Genitourinary: Negative for dysuria and hematuria.  Neurological: Negative for headaches.  All other systems reviewed and are negative.    Physical Exam Updated Vital Signs BP 126/82 (BP Location: Right Arm)   Pulse (!) 108   Temp 98 F (36.7 C) (Oral)   Resp 14   Ht 4\' 11"  (1.499 m)   Wt 44.5 kg   SpO2 99%   BMI 19.79 kg/m   Physical Exam  Constitutional: She is oriented to person,  place, and time. She appears well-developed and well-nourished.  Appears uncomfortable  HENT:  Head: Normocephalic and atraumatic.  Mouth/Throat: Oropharynx is clear and moist and mucous membranes are normal.  Eyes: Pupils are equal, round, and reactive to light. Conjunctivae, EOM and lids are normal.  Neck: Full passive range of motion without pain.  Cardiovascular: Normal rate, regular rhythm, normal heart sounds and normal pulses. Exam reveals no gallop and no friction rub.  No murmur heard. Pulmonary/Chest: Effort normal and breath sounds normal.  Abdominal: Soft. Normal appearance. There is tenderness in the left upper quadrant. There is no rigidity, no guarding and no CVA tenderness.  Abdomen is soft, nondistended.  Tenderness noted to left upper quadrant.  No CVA tenderness bilaterally.  No rigidity, guarding.  Musculoskeletal: Normal range of motion.  Neurological: She is alert and oriented to person, place, and time.  Skin: Skin is warm and dry. Capillary refill takes less than 2 seconds.  Psychiatric: She has a normal mood and affect. Her speech is normal.  Nursing note and vitals reviewed.    ED Treatments / Results  Labs (all labs ordered are listed, but only abnormal results are displayed) Labs Reviewed - No data to display  EKG None  Radiology No results found.  Procedures Procedures  (including critical care time)  Medications Ordered in ED Medications - No data to display   Initial Impression / Assessment and Plan / ED Course  I have reviewed the triage vital signs and the nursing notes.  Pertinent labs & imaging results that were available during my care of the patient were reviewed by me and considered in my medical decision making (see chart for details).     57 y.o. F with PMH/o HTN, Pancreatic pseudocyst status post drainage and stent placement in May 2019 who presents for evaluation of worsening abdominal pain.  Patient reports initially pain throughout her generalized and localized more to the left upper quadrant.  She has a history of pancreatitis and states that this is similar.  Reports decreased appetite.  No fevers, vomiting.  Was recently admitted for worsening abdominal pain.  Was discharged with close follow-up with her GI doctor in June but patient never followed up with him.  Patient is afebrile, non-toxic appearing, sitting comfortably on examination table. Vital signs reviewed and stable.  On exam, patient with tenderness noted to left upper quadrant.  No CVA tenderness bilaterally.  No rigidity, guarding.  Consider viral GI process versus pancreatitis.  Plan to check basic labs.  Lipase is slightly elevated at 80.  This is increased from previous in June which was 6129.  Review of records she is consistently has elevated lipase.  CBC shows elevation in white blood cell count.  Hematocrit and hemoglobin unremarkable.  CMP shows bicarb 23.  BUN is 22.  Anion gap is 18.  Patient received fluids.  UA shows ketones.  Given patient's history, will plan for CT abdomen pelvis for further evaluation.  Patient's GI doctor is Dr. Corliss Parishodd Baron, [facility] [phone].   Patient signed out to [facility], PA-C with CT abd/pelvis pending.  Please see her note for further evaluation.   Final Clinical Impressions(s) / ED Diagnoses   Final diagnoses:  None      ED Discharge Orders    None       Rosana HoesLayden, Lindsey A, PA-C 07/18/18 0015    Gerhard MunchLockwood, Robert, MD 07/18/18 2029

## 2018-07-17 NOTE — ED Triage Notes (Signed)
Pt presents to Ed from home for ABD pain. Pt reports that the pain started on Sunday, and has gotten worse. Pt reports hx of pancreatitis, and this feels like it. Pt reports that she has been taking a lot of advil.

## 2018-07-18 ENCOUNTER — Emergency Department (HOSPITAL_COMMUNITY): Payer: Self-pay

## 2018-07-18 LAB — COMPREHENSIVE METABOLIC PANEL
ALBUMIN: 3.7 g/dL (ref 3.5–5.0)
ALT: 10 U/L (ref 0–44)
ANION GAP: 18 — AB (ref 5–15)
AST: 17 U/L (ref 15–41)
Alkaline Phosphatase: 96 U/L (ref 38–126)
BUN: 22 mg/dL — ABNORMAL HIGH (ref 6–20)
CO2: 23 mmol/L (ref 22–32)
Calcium: 10.3 mg/dL (ref 8.9–10.3)
Chloride: 96 mmol/L — ABNORMAL LOW (ref 98–111)
Creatinine, Ser: 0.71 mg/dL (ref 0.44–1.00)
GFR calc non Af Amer: >60 mL/min (ref >60)
GLUCOSE: 103 mg/dL — AB (ref 70–99)
POTASSIUM: 4.5 mmol/L (ref 3.5–5.1)
SODIUM: 137 mmol/L (ref 135–145)
Total Bilirubin: 1.2 mg/dL (ref 0.3–1.2)
Total Protein: 9.6 g/dL — ABNORMAL HIGH (ref 6.5–8.1)

## 2018-07-18 LAB — LIPASE, BLOOD: Lipase: 80 U/L — ABNORMAL HIGH (ref 11–51)

## 2018-07-18 MED ORDER — OXYCODONE-ACETAMINOPHEN 5-325 MG PO TABS: 1.0000 | ORAL_TABLET | Freq: Three times a day (TID) | ORAL | 0 refills | Status: DC | PRN

## 2018-07-18 MED ORDER — OXYCODONE-ACETAMINOPHEN 5-325 MG PO TABS
2.0000 | ORAL_TABLET | Freq: Once | ORAL | Status: AC
  Administered 2018-07-18: 2 via ORAL
  Filled 2018-07-18: qty 2

## 2018-07-18 MED ORDER — IOPAMIDOL (ISOVUE-300) INJECTION 61%
90.0000 mL | Freq: Once | INTRAVENOUS | Status: AC | PRN
  Administered 2018-07-18: 90 mL via INTRAVENOUS

## 2018-07-18 MED ORDER — IOHEXOL 300 MG/ML  SOLN
50.0000 mL | Freq: Once | INTRAMUSCULAR | Status: AC | PRN
  Administered 2018-07-18: 30 mL via INTRAVENOUS

## 2018-07-18 MED ORDER — LACTATED RINGERS IV BOLUS
1000.0000 mL | Freq: Once | INTRAVENOUS | Status: AC
  Administered 2018-07-18: 1000 mL via INTRAVENOUS

## 2018-07-18 NOTE — ED Notes (Signed)
Bed: [id number] Expected date:  Expected time:  Means of arrival:  Comments: Hold took room 3

## 2018-07-18 NOTE — ED Provider Notes (Signed)
2:35 AM Patient care [facility]-C at change of shift.  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  Follow[facility] by [facility] Corliss Parishodd Baron, [facility].  Work-up today with no leukocytosis, though she does have a left shift.  Lipase mildly elevated at 80.  LFTs are reassuring.  No lactate today, so MAX Ranson score would be 1; c/w low risk of severe pancreatitis.  CT scan was pending at shift change.  Results reviewed which show a likely acute on chronic pancreatitis.  There is evidence of a new 1.6 cm collection adjacent to the body of the pancreas, favored to represent new pseudocyst or an area of walled off necrosis.  No apparent complications from prior intervention.  Patient complaining of worsening pain since drinking her oral contrast.  She has not had any vomiting.  Will give oral Percocet and trial POs to help in determining disposition.  4:06 AM Patient reassessed.  She states that her pain has improved with Percocet.  Able to tolerate oral fluids as well as saltine crackers.  She expresses comfort with discharge if able to be coordinated with her gastroenterologist.  Will [facility] is comfort with plan for outpatient follow up.  4:26 AM Case discussed with Dr. Georges Mouse[facility]an Grimm [facility].  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  Also reiterated recent admission for sepsis in June with endoscopic ultrasound showing acute pancreatitis.  Work-up in the emergency department this evening reviewed including absence of leukocytosis with left shift, lipase of 80, reassuring LFTs, and CT scan with new 1.6 cm collection representative of new pseudocyst vs acute walled off necrosis.  Communicated improvement in patient's symptoms with ability to  tolerate PO as well as patient's comfort with outpatient management at this time.  Dr. Josetta HuddleGrimm expresses comfort with plan for outpatient follow up; does not express desire for admission at this time.   Will proceed with plan for discharge. Rx for percocet given for pain control. Return precautions discussed and provided. Patient discharged in stable condition with no unaddressed concerns.   Vitals:   07/17/18 2119 07/17/18 2154 07/17/18 2332 07/18/18 0129  BP: 126/82 (!) 141/91 (!) 127/95 131/69  Pulse: (!) 108  (!) 110 88  Resp: 14  16 17   Temp: 98 F (36.7 C)     TempSrc: Oral     SpO2: 99%  98% 99%  Weight: 44.5 kg     Height: 4\' 11"  (1.499 m)       Results for orders placed or performed during the hospital encounter of 07/17/18  Comprehensive metabolic panel  Result Value Ref Range   Sodium 137 135 - 145 mmol/L   Potassium 4.5 3.5 - 5.1 mmol/L   Chloride 96 (L) 98 - 111 mmol/L   CO2 23 22 - 32 mmol/L   Glucose, Bld 103 (H) 70 - 99 mg/dL   BUN 22 (H) 6 - 20 mg/dL   Creatinine, Ser 9.560.71 0.44 - 1.00 mg/dL   Calcium 21.310.3 8.9 - 08.610.3 mg/dL   Total Protein 9.6 (H) 6.5 - 8.1 g/dL   Albumin 3.7 3.5 - 5.0 g/dL   AST 17 15 - 41 U/L   ALT 10 0 - 44 U/L   Alkaline Phosphatase 96 38 - 126 U/L   Total Bilirubin 1.2 0.3 -  1.2 mg/dL   GFR calc non Af Amer >60 >60 mL/min   GFR calc Af Amer >60 >60 mL/min   Anion gap 18 (H) 5 - 15  Lipase, blood  Result Value Ref Range   Lipase 80 (H) 11 - 51 U/L  CBC with Differential  Result Value Ref Range   WBC 10.0 4.0 - 10.5 K/uL   RBC 4.14 3.87 - 5.11 MIL/uL   Hemoglobin 13.7 12.0 - 15.0 g/dL   HCT 41.3 24.4 - 01.0 %   MCV 99.3 78.0 - 100.0 fL   MCH 33.1 26.0 - 34.0 pg   MCHC 33.3 30.0 - 36.0 g/dL   RDW 27.2 (H) 53.6 - 64.4 %   Platelets 257 150 - 400 K/uL   Neutrophils Relative % 93 %   Neutro Abs 9.2 (H) 1.7 - 7.7 K/uL   Lymphocytes Relative 5 %   Lymphs Abs 0.5 (L) 0.7 - 4.0 K/uL   Monocytes Relative 2 %   Monocytes Absolute 0.2 0.1  - 1.0 K/uL   Eosinophils Relative 0 %   Eosinophils Absolute 0.0 0.0 - 0.7 K/uL   Basophils Relative 0 %   Basophils Absolute 0.0 0.0 - 0.1 K/uL  Urinalysis, Routine w reflex microscopic  Result Value Ref Range   Color, Urine YELLOW YELLOW   APPearance CLEAR CLEAR   Specific Gravity, Urine 1.033 (H) 1.005 - 1.030   pH 5.0 5.0 - 8.0   Glucose, UA NEGATIVE NEGATIVE mg/dL   Hgb urine dipstick NEGATIVE NEGATIVE   Bilirubin Urine SMALL (A) NEGATIVE   Ketones, ur 80 (A) NEGATIVE mg/dL   Protein, ur >=034 (A) NEGATIVE mg/dL   Nitrite NEGATIVE NEGATIVE   Leukocytes, UA NEGATIVE NEGATIVE   RBC / HPF 0-5 0 - 5 RBC/hpf   WBC, UA 0-5 0 - 5 WBC/hpf   Bacteria, UA RARE (A) NONE SEEN   Mucus PRESENT    Hyaline Casts, UA PRESENT    Ct Abdomen Pelvis W Contrast  Result Date: 07/18/2018 CLINICAL DATA:  Abdominal pain starting on Sunday and worsening. History of pancreatitis. EXAM: CT ABDOMEN AND PELVIS WITH CONTRAST TECHNIQUE: Multidetector CT imaging of the abdomen and pelvis was performed using the standard protocol following bolus administration of intravenous contrast. CONTRAST:  30mL OMNIPAQUE IOHEXOL 300 MG/ML SOLN, 90mL ISOVUE-300 IOPAMIDOL (ISOVUE-300) INJECTION 61% COMPARISON:  05/26/2018 FINDINGS: Lower chest: The lung bases are clear. Mild thickening of the esophageal wall may indicate reflux or esophagitis. No dilatation. Hepatobiliary: No focal liver abnormality is seen. No gallstones, gallbladder wall thickening, or biliary dilatation. Pancreas: Diffuse pancreatic calcifications consistent with chronic pancreatitis. Diffuse pancreatic ductal dilatation. Chronic pseudo cyst over the head of the pancreas with a double pigtail drainage catheter to the distal stomach. The pseudocyst is decompressed since the previous study with drainage catheter in place. There is a new collection adjacent to the body of the pancreas measuring 1.6 cm diameter. This may be a new pseudo cyst or an area of acute  walled off necrosis. Mild edema is suggested around the pancreas and around the adjacent stomach. This may indicate acute inflammation. Spleen: Spleen size is normal. Adrenals/Urinary Tract: No adrenal gland nodules. Nephrograms are symmetrical. No hydronephrosis or hydroureter. Bladder is unremarkable. Stomach/Bowel: Stomach is decompressed. Suggestion of distal gastric wall thickening possibly representing reactive inflammation or gastritis. Under distention limits evaluation. Small bowel are not abnormally distended. No wall thickening. Scattered stool throughout the colon. No colonic distention or wall thickening. Appendix is normal. Vascular/Lymphatic: Aortic atherosclerosis. No  enlarged abdominal or pelvic lymph nodes. Reproductive: Uterus and ovaries are not enlarged. Probable small cyst in the right ovary. No change since previous study. Other: No free air or free fluid in the abdomen. Abdominal wall musculature appears intact. Musculoskeletal: No acute or significant osseous findings. IMPRESSION: 1. Changes of chronic pancreatitis likely with superimposed acute pancreatitis. There is a new 1.6 cm collection adjacent to the body of the pancreas which may represent a new pseudo cyst or an area of acute walled off necrosis. Mild edema around the pancreas and around the adjacent stomach. This may indicate acute pancreatitis with reactive gastritis. 2. Diffuse pancreatic calcifications consistent with chronic pancreatitis. Chronic pseudo cyst above the head of the pancreas. A double pigtail drainage catheter has been placed since the previous study, draining a previous large pseudocyst into the stomach. Minimal residual. 3. Mild thickening of the esophageal wall may indicate reflux or esophagitis. Electronically Signed   By: Burman NievesWilliam  Stevens M.D.   On: 07/18/2018 02:09      Antony MaduraHumes, Briani Maul, PA-C 07/18/18 0435    Gilda CreasePollina, Christopher J, MD 07/18/18 [phone]

## 2018-07-18 NOTE — Discharge Instructions (Signed)
We recommend that you drink plenty of fluids to prevent dehydration and to assist in management of your pancreatitis.  Take Percocet as prescribed for pain.  Do not drive a motor vehicle after taking this medication as it may make you drowsy and impair your judgment.  Call your gastroenterologist on Monday morning to schedule an appointment for close follow-up.  Avoid fatty foods as well as alcohol.  You may follow-up with your primary doctor as needed.

## 2018-08-03 ENCOUNTER — Telehealth: Payer: Self-pay | Admitting: Internal Medicine

## 2018-08-03 NOTE — Telephone Encounter (Signed)
Patient needs a note to return to work.  Patient is suppose to return to work on August 06, 2018].  Patient can be reached [phone].

## 2018-08-04 ENCOUNTER — Ambulatory Visit: Payer: Self-pay | Admitting: Internal Medicine

## 2018-08-04 ENCOUNTER — Encounter: Payer: Self-pay | Admitting: Internal Medicine

## 2018-08-04 VITALS — BP 122/80 | HR 88 | Resp 12 | Ht 59.0 in | Wt 87.0 lb

## 2018-08-04 DIAGNOSIS — R634 Abnormal weight loss: Secondary | ICD-10-CM

## 2018-08-04 DIAGNOSIS — K860 Alcohol-induced chronic pancreatitis: Secondary | ICD-10-CM

## 2018-08-04 DIAGNOSIS — R1013 Epigastric pain: Secondary | ICD-10-CM

## 2018-08-04 DIAGNOSIS — K2900 Acute gastritis without bleeding: Secondary | ICD-10-CM

## 2018-08-04 MED ORDER — OMEPRAZOLE 20 MG PO CPDR
20.0000 mg | DELAYED_RELEASE_CAPSULE | Freq: Every day | ORAL | 0 refills | Status: DC
Start: 2018-08-04 — End: 2018-09-08

## 2018-08-04 NOTE — Telephone Encounter (Signed)
Will discuss during OV

## 2018-08-04 NOTE — Patient Instructions (Addendum)
Try [employer] in whole milk--use this for a meal until you are feeling better. Take the Omeprazole for 30 days, then stop

## 2018-08-04 NOTE — Progress Notes (Signed)
   Subjective:    Patient ID: Nichole [name], female    DOB: 12-22-1960, 57 y.o.   MRN: [id number]  HPI   Recurrent episode of pancreatitis for which she was seen in ED on 07/17/18.     Again, underwent stent placement to drain pseudocyst April 2019, and improved.  Stent removed in May.  6/26, developed pain and fever.  New pseudocyst formed in head of pancreas.   Purulent fluid aspirated with placement of stent through doudenum. Also developed LE edema with this episode.   Was doing well In August 6th follow up, gaining weight and pain free.  ED staff communicated with covering [facility], Dr. Georges Mouse regarding her absence of leukocytosis or left shift,  reassuring LFTs, Lipase mildly high at 80 and CT scan with new 1.6 cm collection possibly representing new pseudocyst vs acute walled off necrosis.   Patient was taking ibuprofen "like candy" prior to ED visit and was told to stop.  Patient had been ill with epigastric pain for about 8-9 days, worsening with poor intake over the 3 days prior to being seen in ED.   She denies drinking any alcohol prior to the pain starting. She relates the last time she required narcotic (oxycodone) pain medication was 2 days ago.   Is eating candy, chocolate milk, Ensure.  Ensure is expensive for her. She continues to smoke. No frothy, foul smelling or floating stools.  Lost 8 lbs since last visit a month ago.  She is down to 87 lbs.  She has an appt at [facility] for a CT scan on 9/11 and a follow up appt on the 17th  She would like to go back to work in 2 days.  Has been out since ED visit and needs the money.   Not able to go to counseling.   2.  Anemia:  Not taking iron or B12--cannot afford.  Also, does not tolerate without a meal.   Current Meds  Medication Sig  . oxyCODONE-acetaminophen (PERCOCET/ROXICET) 5-325 MG tablet Take 1-2 tablets by mouth every 8 (eight) hours as needed for severe pain.   Allergies  Allergen Reactions  . Lisinopril  Swelling     facial angioedema    Review of Systems     Objective:   Physical Exam  Mild distress when moves to exam table Cachectic female. HEENT:  MMM Chest:  CTA CV: RRR without murmur or rub.  Radial and DP pulses normal and equal Abd:  S, + BS, epigastric tenderness mild to moderated without rebound or peritoneal signs.  No mass or HSM. LE:  No edea.      Assessment & Plan:  1.  Epigastric pain with recurrent pancreatitis and pancreatic pseudocyst:  Not clear if all of her symptoms related to just her pancreas.  Start Omeprazole 20 mg daily on empty stomach. Recheck CBC and BMP. Denies alcohol use, but encouraged her to be certain she is not using to help prevent recurrence of pancreatitis.  2.  Nutritional concerns/B12 deficiency:  [employer]--given application.  For vitamins and minerals and avoid extra costs.  3.  Weight loss:  As above.  Encouraged a better diet rather than candy.  Consider [employer] breakfast in whole milk to save money.

## 2018-08-05 LAB — BASIC METABOLIC PANEL
BUN / CREAT RATIO: 24 — AB (ref 9–23)
BUN: 12 mg/dL (ref 6–24)
CHLORIDE: 99 mmol/L (ref 96–106)
CO2: 21 mmol/L (ref 20–29)
Calcium: 9.4 mg/dL (ref 8.7–10.2)
Creatinine, Ser: 0.5 mg/dL — ABNORMAL LOW (ref 0.57–1.00)
GFR, EST AFRICAN AMERICAN: 126 mL/min/{1.73_m2} (ref >59)
GFR, EST NON AFRICAN AMERICAN: 109 mL/min/{1.73_m2} (ref >59)
Glucose: 118 mg/dL — ABNORMAL HIGH (ref 65–99)
Potassium: 4.3 mmol/L (ref 3.5–5.2)
SODIUM: 140 mmol/L (ref 134–144)

## 2018-08-05 LAB — CBC WITH DIFFERENTIAL/PLATELET
BASOS: 0 %
Basophils Absolute: 0 10*3/uL (ref 0.0–0.2)
EOS (ABSOLUTE): 0.1 10*3/uL (ref 0.0–0.4)
Eos: 1 %
Hematocrit: 31.8 % — ABNORMAL LOW (ref 34.0–46.6)
Hemoglobin: 10.1 g/dL — ABNORMAL LOW (ref 11.1–15.9)
Immature Grans (Abs): 0 10*3/uL (ref 0.0–0.1)
Immature Granulocytes: 1 %
LYMPHS ABS: 1.2 10*3/uL (ref 0.7–3.1)
LYMPHS: 20 %
MCH: 31.5 pg (ref 26.6–33.0)
MCHC: 31.8 g/dL (ref 31.5–35.7)
MCV: 99 fL — AB (ref 79–97)
Monocytes Absolute: 0.2 10*3/uL (ref 0.1–0.9)
Monocytes: 4 %
Neutrophils Absolute: 4.2 10*3/uL (ref 1.4–7.0)
Neutrophils: 74 %
Platelets: 396 10*3/uL (ref 150–450)
RBC: 3.21 x10E6/uL — ABNORMAL LOW (ref 3.77–5.28)
RDW: 16.6 % — ABNORMAL HIGH (ref 12.3–15.4)
WBC: 5.7 10*3/uL (ref 3.4–10.8)

## 2018-09-08 ENCOUNTER — Ambulatory Visit: Payer: Self-pay | Admitting: Internal Medicine

## 2018-09-08 ENCOUNTER — Encounter: Payer: Self-pay | Admitting: Internal Medicine

## 2018-09-08 VITALS — BP 102/66 | HR 90 | Resp 12 | Ht 59.0 in | Wt 90.0 lb

## 2018-09-08 DIAGNOSIS — K2970 Gastritis, unspecified, without bleeding: Secondary | ICD-10-CM

## 2018-09-08 DIAGNOSIS — D518 Other vitamin B12 deficiency anemias: Secondary | ICD-10-CM

## 2018-09-08 DIAGNOSIS — K860 Alcohol-induced chronic pancreatitis: Secondary | ICD-10-CM

## 2018-09-08 DIAGNOSIS — K209 Esophagitis, unspecified without bleeding: Secondary | ICD-10-CM

## 2018-09-08 DIAGNOSIS — Z716 Tobacco abuse counseling: Secondary | ICD-10-CM

## 2018-09-08 DIAGNOSIS — F17200 Nicotine dependence, unspecified, uncomplicated: Secondary | ICD-10-CM

## 2018-09-08 MED ORDER — FERROUS GLUCONATE 324 (38 FE) MG PO TABS: ORAL_TABLET | ORAL | 3 refills | Status: DC

## 2018-09-08 MED ORDER — OMEPRAZOLE 40 MG PO CPDR: DELAYED_RELEASE_CAPSULE | ORAL | 6 refills | Status: DC

## 2018-09-08 NOTE — Progress Notes (Signed)
   Subjective:    Patient ID: Nichole [name], female    DOB: [birth year], 57 y.o.   MRN: [id number]  HPI   1.  Follow up of Epigastric Pain from 08/04/2018:   Started her again regularly on  Omeprazole 20 mg for what was felt to be gastritis. Her CT scan in August also showed thickening of distal esophagus.  She was also taking Ibuprofen "like candy" with her pancreatitis pain. She is a difficult historian, but sounds like she stopped having the mid epigastric pain, only having discomfort with pressure against the area, so she stopped the Omeprazole after 3 days.  She was seen at [facility] subsequently, CT scan there on 9/11 showed the esophageal and gastric wall thickening felt to be most likely esophagitis/gastritis.  She admitted to not taking the omeprazole daily as recommended.  Her dose was increased to 40 mg daily.  She never started this dose or the lower dose after the 9/11 appt  She does have a better appetite now. Ensure, sandwiches, cheeseburger, fruit.  No eating vegetables often.    Still smoking. In fact, smoking more.  She does not want medication to support.  She is unwilling to think about stopping currently.  Dicussed at length that smoking and drinking alcohol are the worst things she can do to herself right now.   Never filled out Med[employer] application  Current Meds  Medication Sig  . omeprazole (PRILOSEC) 40 MG capsule 1 cap by mouth in the morning 1/2 hour before breakfast  . [DISCONTINUED] omeprazole (PRILOSEC) 20 MG capsule Take 1 capsule (20 mg total) by mouth daily.    Allergies  Allergen Reactions  . Lisinopril Swelling     facial angioedema    Review of Systems     Objective:   Physical Exam  NAD HEENT:  MMM Chest:  CTA CV:  RRR without murmur or rub.  Radial and DP pulses normal and equal. Abd:  S, mild epigastric tenderness without rebound or peritoneal signs.  + BS, No HSM or mass. LE:  No edema       Assessment & Plan:  1.  Chronic recurrent  alcohol induced pancreatitis  2.  Esophagitis/gastritis on CT scan:  Encouraged her to pick up the Omeprazole 40 mg daily and get started.  Recommend taking daily for at least 4 months.  Again, to avoid NSAIDS, alcohol, tobacco.    3.  Anemia:  To fill out Med[employer] application and then can send Rx.  Drop off here and we can fax.  Also to start Ferrous gluconate 324 mg daily.  4.  Tobacco abuse: not interested in any support to quit despite long discussion.

## 2018-10-07 ENCOUNTER — Emergency Department (HOSPITAL_COMMUNITY): Payer: Medicaid Other

## 2018-10-07 ENCOUNTER — Other Ambulatory Visit: Payer: Self-pay

## 2018-10-07 ENCOUNTER — Encounter (HOSPITAL_COMMUNITY): Payer: Self-pay

## 2018-10-07 ENCOUNTER — Emergency Department (HOSPITAL_COMMUNITY)
Admission: EM | Admit: 2018-10-07 | Discharge: 2018-10-08 | Disposition: A | Discharge status: Home / Self Care | Payer: Medicaid Other | Attending: Emergency Medicine | Admitting: Emergency Medicine

## 2018-10-07 DIAGNOSIS — I10 Essential (primary) hypertension: Secondary | ICD-10-CM | POA: Diagnosis not present

## 2018-10-07 DIAGNOSIS — F17210 Nicotine dependence, cigarettes, uncomplicated: Secondary | ICD-10-CM | POA: Insufficient documentation

## 2018-10-07 DIAGNOSIS — K863 Pseudocyst of pancreas: Secondary | ICD-10-CM | POA: Insufficient documentation

## 2018-10-07 DIAGNOSIS — Z79899 Other long term (current) drug therapy: Secondary | ICD-10-CM | POA: Insufficient documentation

## 2018-10-07 DIAGNOSIS — R1012 Left upper quadrant pain: Secondary | ICD-10-CM | POA: Diagnosis present

## 2018-10-07 LAB — COMPREHENSIVE METABOLIC PANEL
ALT: 17 U/L (ref 0–44)
ANION GAP: 11 (ref 5–15)
AST: 33 U/L (ref 15–41)
Albumin: 2.9 g/dL — ABNORMAL LOW (ref 3.5–5.0)
Alkaline Phosphatase: 80 U/L (ref 38–126)
BUN: 23 mg/dL — ABNORMAL HIGH (ref 6–20)
CO2: 26 mmol/L (ref 22–32)
Calcium: 9.2 mg/dL (ref 8.9–10.3)
Chloride: 99 mmol/L (ref 98–111)
Creatinine, Ser: 0.52 mg/dL (ref 0.44–1.00)
Glucose, Bld: 119 mg/dL — ABNORMAL HIGH (ref 70–99)
POTASSIUM: 3.6 mmol/L (ref 3.5–5.1)
Sodium: 136 mmol/L (ref 135–145)
Total Bilirubin: 0.5 mg/dL (ref 0.3–1.2)
Total Protein: 8.8 g/dL — ABNORMAL HIGH (ref 6.5–8.1)

## 2018-10-07 LAB — CBC
HCT: 29 % — ABNORMAL LOW (ref 36.0–46.0)
HEMOGLOBIN: 8.8 g/dL — AB (ref 12.0–15.0)
MCH: 30.3 pg (ref 26.0–34.0)
MCHC: 30.3 g/dL (ref 30.0–36.0)
MCV: 100 fL (ref 80.0–100.0)
NRBC: 0 % (ref 0.0–0.2)
Platelets: 456 10*3/uL — ABNORMAL HIGH (ref 150–400)
RBC: 2.9 MIL/uL — AB (ref 3.87–5.11)
RDW: 13.4 % (ref 11.5–15.5)
WBC: 9.6 10*3/uL (ref 4.0–10.5)

## 2018-10-07 LAB — LIPASE, BLOOD: Lipase: 29 U/L (ref 11–51)

## 2018-10-07 LAB — I-STAT BETA HCG BLOOD, ED (MC, WL, AP ONLY)

## 2018-10-07 MED ORDER — TRAMADOL HCL 50 MG PO TABS: 50.0000 mg | ORAL_TABLET | Freq: Four times a day (QID) | ORAL | 0 refills | Status: DC | PRN

## 2018-10-07 MED ORDER — IOHEXOL 300 MG/ML  SOLN
30.0000 mL | Freq: Once | INTRAMUSCULAR | Status: AC | PRN
  Administered 2018-10-07: 30 mL via ORAL

## 2018-10-07 MED ORDER — KETOROLAC TROMETHAMINE 30 MG/ML IJ SOLN
30.0000 mg | Freq: Once | INTRAMUSCULAR | Status: AC
  Administered 2018-10-07: 30 mg via INTRAVENOUS
  Filled 2018-10-07: qty 1

## 2018-10-07 MED ORDER — SODIUM CHLORIDE 0.9 % IV BOLUS
1000.0000 mL | Freq: Once | INTRAVENOUS | Status: AC
  Administered 2018-10-07: 1000 mL via INTRAVENOUS

## 2018-10-07 MED ORDER — MORPHINE SULFATE (PF) 4 MG/ML IV SOLN
4.0000 mg | Freq: Once | INTRAVENOUS | Status: AC
  Administered 2018-10-07: 4 mg via INTRAVENOUS
  Filled 2018-10-07: qty 1

## 2018-10-07 MED ORDER — MORPHINE SULFATE (PF) 4 MG/ML IV SOLN: 4.0000 mg | Freq: Once | INTRAVENOUS | Status: DC

## 2018-10-07 MED ORDER — IOHEXOL 300 MG/ML  SOLN
75.0000 mL | Freq: Once | INTRAMUSCULAR | Status: AC | PRN
  Administered 2018-10-07: 75 mL via INTRAVENOUS

## 2018-10-07 MED ORDER — OXYCODONE-ACETAMINOPHEN 5-325 MG PO TABS
1.0000 | ORAL_TABLET | Freq: Once | ORAL | Status: AC
  Administered 2018-10-07: 1 via ORAL
  Filled 2018-10-07: qty 1

## 2018-10-07 MED ORDER — ONDANSETRON HCL 4 MG/2ML IJ SOLN
4.0000 mg | Freq: Once | INTRAMUSCULAR | Status: DC
  Filled 2018-10-07: qty 2

## 2018-10-07 NOTE — ED Provider Notes (Signed)
[facility]-EMERGENCY DEPT Provider Note   CSN: [id number] Arrival date & time: 10/07/18  1753     History   Chief Complaint Chief Complaint  Patient presents with  . Abdominal Pain    HPI Nichole [name] is a 57 y.o. female history of pancreatitis with pseudocyst, hypertension, presenting with abdominal pain.  States that she has been having epigastric pain and left upper quadrant pain for the last week and a half.  Patient states that she has no appetite and only able to drink some boost.  Had some nausea and vomiting several days ago.  Patient has been taking several doses of Advil with minimal relief.  She is not currently on any pain medicine.  She was seen here in August for abdominal pain was diagnosed with pseudocyst on CT.  Follows up with [facility].  The history is provided by the patient.    Past Medical History:  Diagnosis Date  . Acute pancreatitis 12/05/2010, 01/2016   with complex pseudocyst, attributed to ETOH.  2017: pseudocyst, acute on chronic pancreatitis. pancreatic calcifications.  . ANEMIA, B12 DEFICIENCY 04/05/2009   macrocytic  . EXCESSIVE MENSTRUAL BLEEDING 03/18/2010   uterine fibroids. gyn eval and bx neg  . Hematemesis 01/2016   in setting pancreatitispseudocyst.  . HYPERTENSION 01/29/2008  . LEG PAIN, BILATERAL 06/22/2008  . Palpitations 01/02/2009   States heart rate has been up to 180, though states has never been seen when having fast heart rate.  Marland Kitchen PERIMENOPAUSAL SYNDROME 08/23/2009  . RASH 12/23/2010  . 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 SYNCOPE 01/02/2009  . THYROID FUNCTION TEST, ABNORMAL 03/19/2009  . TOBACCO USE 01/25/2008  . WEIGHT LOSS-ABNORMAL 12/05/2010    Patient Active Problem List   Diagnosis Date Noted  . Numbness and tingling of left upper and lower extremity 07/06/2018  . Chronic pancreatitis (HCC) 05/26/2018  . 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 Anxiety  and depression 07/17/2017  . Pseudocyst of pancreas   . Hematemesis   . Esophageal stricture   . Acute pancreatitis 02/27/2016  . Alcohol abuse 02/27/2016  . Does not have health insurance 06/06/2014  . History of chest pain at rest 06/06/2014  . Ganglion cyst of wrist 06/06/2014  . Visit for preventive health examination 02/22/2013  . Change in bowel habits 02/22/2013  . History of positive PPD 02/22/2013  . Neuropathy 08/26/2012  . Numbness and tingling of both legs below knees 08/26/2012  . Anterior leg pain 05/13/2012  . Edema 05/13/2012  . Pseudocyst, pancreas 02/22/2011  . GASTROINTESTINAL XRAY, ABNORMAL 12/16/2010  . Weight loss 12/05/2010  . Abdominal pain 12/03/2010  . SWEATING 09/23/2010  . Deficiency anemia 08/23/2009  . PERIMENOPAUSAL SYNDROME 08/23/2009  . ANEMIA, B12 DEFICIENCY 04/05/2009  . THYROID FUNCTION TEST, ABNORMAL 03/19/2009  . SYNCOPE 01/02/2009  . PALPITATIONS 01/02/2009  . LEG PAIN, BILATERAL 06/22/2008  . UNSPECIFIED ANEMIA 02/14/2008  . Essential hypertension 01/29/2008  . TOBACCO USE 01/25/2008    Past Surgical History:  Procedure Laterality Date  . ESOPHAGOGASTRODUODENOSCOPY (EGD) WITH PROPOFOL N/A 02/28/2016   Procedure: ESOPHAGOGASTRODUODENOSCOPY (EGD) WITH PROPOFOL;  Surgeon: Napoleon Form, MD;  Location: [facility];  Service: Endoscopy;  Laterality: N/A;  . KNEE SURGERY Left 1998   Arthroscopic--for torn meniscus     OB History    Gravida  0   Para  0   Term  0   Preterm  0   AB  0  Living  0     SAB  0   TAB  0   Ectopic  0   Multiple  0   Live Births               Home Medications    Prior to Admission medications   Medication Sig Start Date End Date Taking? Authorizing Provider  omeprazole (PRILOSEC) 40 MG capsule 1 cap by mouth in the morning 1/2 hour before breakfast 09/08/18  Yes Julieanne Manson, MD  ferrous gluconate (FERGON) 324 MG tablet 1 tab by mouth daily Patient not taking: Reported  on 10/07/2018 09/08/18   Julieanne Manson, MD  metoprolol tartrate (LOPRESSOR) 25 MG tablet Take 0.5 tablets (12.5 mg total) by mouth daily as needed (rapid heart rate/ high blood pressure). 1/4 tablet as needed 01/19/18   Julieanne Manson, MD  vitamin B-12 (CYANOCOBALAMIN) 1000 MCG tablet Take 1 tablet (1,000 mcg total) by mouth daily. Patient not taking: Reported on 07/18/2018 07/24/17   Julieanne Manson, MD    Family History Family History  Problem Relation Age of Onset  . Hypertension Mother   . Kidney disease Mother        removed for cyst--not clear if this was a malignancy or not  . Other Mother        overweight--lost a lot following diagnosis of diabetes  . Diabetes Mother   . Heart attack Father   . Stroke Father        died age 60 yo--had multiple TIAs  . Muscular dystrophy Father   . Hypertension Brother   . Neuromuscular disorder Unknown        spinomuscular atrophy    Social History Social History   Tobacco Use  . Smoking status: Current Every Day Smoker    Packs/day: 0.50    Years: 36.00    Pack years: 18.00    Types: Cigarettes  . Smokeless tobacco: Never Used  . Tobacco comment: Not ready to quit.  Does not want patches or other medication support.  Not a "medication"  person.    Substance Use Topics  . Alcohol use: Not Currently    Comment: States none since her first stenting spring of 2019.  . Drug use: No     Allergies   Lisinopril   Review of Systems Review of Systems  Gastrointestinal: Positive for abdominal pain.  All other systems reviewed and are negative.    Physical Exam Updated Vital Signs BP 110/67   Pulse 89   Temp 99.1 F (37.3 C) (Oral)   Resp 20   Ht 4\' 11"  (1.499 m)   Wt 40.8 kg   SpO2 96%   BMI 18.17 kg/m   Physical Exam  Constitutional: She is oriented to person, place, and time.  Uncomfortable, dehydrated   HENT:  Head: Normocephalic.  MM dry   Eyes: Pupils are equal, round, and reactive to light. EOM  are normal.  Cardiovascular: Normal rate, regular rhythm and normal heart sounds.  Pulmonary/Chest: Effort normal and breath sounds normal.  Abdominal: Normal appearance.  + epigastric tenderness, no rebound   Neurological: She is alert and oriented to person, place, and time.  Skin: Skin is warm. Capillary refill takes less than 2 seconds.  Psychiatric: She has a normal mood and affect. Her behavior is normal.  Nursing note and vitals reviewed.    ED Treatments / Results  Labs (all labs ordered are listed, but only abnormal results are displayed) Labs Reviewed  COMPREHENSIVE METABOLIC PANEL -  Abnormal; Notable for the following components:      Result Value   Glucose, Bld 119 (*)    BUN 23 (*)    Total Protein 8.8 (*)    Albumin 2.9 (*)    All other components within normal limits  CBC - Abnormal; Notable for the following components:   RBC 2.90 (*)    Hemoglobin 8.8 (*)    HCT 29.0 (*)    Platelets 456 (*)    All other components within normal limits  LIPASE, BLOOD  URINALYSIS, ROUTINE W REFLEX MICROSCOPIC  I-STAT BETA HCG BLOOD, ED ([facility], [facility], [facility] ONLY)    EKG None  Radiology Ct Abdomen Pelvis W Contrast  Result Date: 10/07/2018 CLINICAL DATA:  Left upper quadrant abdominal pain for 1.5 weeks. Loss of appetite. Personal history of pancreatitis. EXAM: CT ABDOMEN AND PELVIS WITH CONTRAST TECHNIQUE: Multidetector CT imaging of the abdomen and pelvis was performed using the standard protocol following bolus administration of intravenous contrast. CONTRAST:  75mL OMNIPAQUE IOHEXOL 300 MG/ML SOLN, 30mL OMNIPAQUE IOHEXOL 300 MG/ML SOLN COMPARISON:  CT of the abdomen and pelvis 07/18/2018 FINDINGS: Lower chest: The lung bases are clear without focal nodule, mass, or airspace disease. The heart size is normal. No significant pleural or pericardial effusion is present. Hepatobiliary: Mild intra and extrahepatic biliary dilation is present. No focal hepatic lesions are evident.  Gallbladder is within normal limits. Pancreas: Extensive pancreatic calcifications are present. A double-J catheter drains a pancreatic pseudocyst into the antrum of the stomach. A new multiloculated fluid collection is present at the tail of the pancreas. The more medial component measures 2.1 x 1.9 x 1.9 cm. A more lateral component extends along the inferior and anterior aspect of the spleen, measuring 1.6 x 3.5 x 3.0 cm. Additional fluid inflammatory changes extend into the left pericolic gutter. Spleen: The spleen is otherwise unremarkable. There is some fluid about the spleen. Adrenals/Urinary Tract: Adrenal glands are normal bilaterally. Kidneys and ureters are within normal limits. There is no stone or mass lesion. The urinary bladder is within normal limits. Stomach/Bowel: The stomach and duodenum are within normal limits. Small bowel is unremarkable. The terminal ileum is within normal limits. The ascending and transverse colon are normal. Inflammatory changes are present at the splenic flexure. The descending and sigmoid colon are normal. Vascular/Lymphatic: Atherosclerotic calcifications are present in the aorta and branch vessels. There is no aneurysm. Reproductive: Uterus and bilateral adnexa are unremarkable. Other: A small amount of free fluid is evident within the anatomic pelvis on the right. There is no free air. No significant ventral herniation is present. Musculoskeletal: Vertebral body heights alignment are maintained. No focal lytic or blastic lesions are present. Bony pelvis is within normal limits. Hips are located and normal bilaterally. IMPRESSION: 1. Chronic pancreatitis with new cystic lesions at the tail the pancreas. This suggests acute on chronic pancreatitis or interval development of new pseudocysts. 2. Chronic calcific pancreatitis is stable. 3. Stable drainage catheter of pseudocyst into the antrum of the stomach. 4. Mild intra and extrahepatic biliary dilation. Gallbladder is  normal. 5.  Aortic Atherosclerosis (ICD10-I70.0). Electronically Signed   By: Marin Roberts M.D.   On: 10/07/2018 22:22    Procedures Procedures (including critical care time)  Medications Ordered in ED Medications  ondansetron [facility]) injection 4 mg (0 mg Intravenous Hold 10/07/18 1907)  ketorolac (TORADOL) 30 MG/ML injection 30 mg (has no administration in time range)  oxyCODONE-acetaminophen (PERCOCET/ROXICET) 5-325 MG per tablet 1 tablet (has no administration  in time range)  sodium chloride 0.9 % bolus 1,000 mL (1,000 mLs Intravenous New Bag/Given 10/07/18 1838)  morphine 4 MG/ML injection 4 mg (4 mg Intravenous Given 10/07/18 1905)  iohexol (OMNIPAQUE) 300 MG/ML solution 30 mL (30 mLs Oral Contrast Given 10/07/18 1923)  iohexol (OMNIPAQUE) 300 MG/ML solution 75 mL (75 mLs Intravenous Contrast Given 10/07/18 2146)     Initial Impression / Assessment and Plan / ED Course  I have reviewed the triage vital signs and the nursing notes.  Pertinent labs & imaging results that were available during my care of the patient were reviewed by me and considered in my medical decision making (see chart for details).    Nichole [name] is a 57 y.o. female here with abdominal pain. Has hx of pancreatitis with pseudocyst. Quit drinking alcohol several months ago. Will get labs, lipase, CT ab/pel. Will hydrate and reassess.   10:38 PM Labs unremarkable. Lipase nl. CT ab/pel showed pancreatic pseudocyst. Gallbladder is normal. Stable for discharge. Has follow up with [facility]. Will give tramadol prn pain.    Final Clinical Impressions(s) / ED Diagnoses   Final diagnoses:  None    ED Discharge Orders    None       Charlynne Pander, MD 10/07/18 2239

## 2018-10-07 NOTE — ED Triage Notes (Signed)
Pt BIBA from home. Pt has had LUQ abd pain x 1.5 weeks. Denies N/V. Pt has had loss of appetite due to pain, and has only been able to stomach boost. Hx of pancreatitis.

## 2018-10-07 NOTE — Discharge Instructions (Signed)
Continue prilosec   Take Tylenol or motrin for pain   Take tramadol for severe pain.   You have a pseudocyst in your pancreas. Please follow up with your GI doctor at [facility]   Return to ER if you have worse abdominal pain, vomiting, fever, dehydration

## 2018-10-13 ENCOUNTER — Ambulatory Visit: Payer: Medicaid Other | Admitting: Internal Medicine

## 2018-10-13 ENCOUNTER — Encounter: Payer: Self-pay | Admitting: Internal Medicine

## 2018-10-13 VITALS — BP 120/78 | HR 82 | Resp 12 | Ht 59.0 in | Wt 86.0 lb

## 2018-10-13 DIAGNOSIS — K863 Pseudocyst of pancreas: Secondary | ICD-10-CM | POA: Diagnosis not present

## 2018-10-13 DIAGNOSIS — F17200 Nicotine dependence, unspecified, uncomplicated: Secondary | ICD-10-CM

## 2018-10-13 DIAGNOSIS — F419 Anxiety disorder, unspecified: Secondary | ICD-10-CM

## 2018-10-13 DIAGNOSIS — K860 Alcohol-induced chronic pancreatitis: Secondary | ICD-10-CM | POA: Diagnosis not present

## 2018-10-13 DIAGNOSIS — K2970 Gastritis, unspecified, without bleeding: Secondary | ICD-10-CM | POA: Diagnosis not present

## 2018-10-13 DIAGNOSIS — D518 Other vitamin B12 deficiency anemias: Secondary | ICD-10-CM

## 2018-10-13 DIAGNOSIS — F32A Depression, unspecified: Secondary | ICD-10-CM

## 2018-10-13 DIAGNOSIS — F329 Major depressive disorder, single episode, unspecified: Secondary | ICD-10-CM

## 2018-10-13 NOTE — Progress Notes (Signed)
Subjective:    Patient ID: Nichole [name], female    DOB: [birth year], 57 y.o.   MRN: [id number]  HPI   1.  Acute on Chronic Pancreatitis with new pseudocyst formation in tail of pancreas.  Was seen in ED on the 7th of this month and CT showed the new pancreatic lesion. Denies alcohol intake since she thinks August.   She is not having foul smelling, floating stools.  She states she is playing phone tag with [facility].  She states she cancelled her clinic appt on the 26th as she feels she just needs to get a procedure--sounds like an EGD.  Unable to pin her down on why there is phone tag going on.  She stated initially she is not working, but then states later she was and had her phone turned off.  Continues to smoke.  Continues to take Ibuprofen for pain.  Last dose was Friday before going to ED.   She continues to focus on a scale for her pain and why she isn't taking the Tramadol and why she sometimes takes Ibuprofen.  Depression:  Not following up with Margaretmary Lombard at [facility] for counselor recently due to her pain.  States she gets in a dark place.  We tried Remeron 07/2017, she felt like a zombie and had bad nightmares--was thinking about jumping out of a window.   She is unwilling to contemplate trying another antidepressant at this point. She gives me the okay to speak with Dorissa regarding her depression.  Current Meds  Medication Sig  . metoprolol tartrate (LOPRESSOR) 25 MG tablet Take 0.5 tablets (12.5 mg total) by mouth daily as needed (rapid heart rate/ high blood pressure). 1/4 tablet as needed  . omeprazole (PRILOSEC) 40 MG capsule 1 cap by mouth in the morning 1/2 hour before breakfast  . traMADol (ULTRAM) 50 MG tablet Take 1 tablet (50 mg total) by mouth every 6 (six) hours as needed.   Allergies  Allergen Reactions  . Lisinopril Swelling     facial angioedema   Review of Systems     Objective:   Physical Exam NAD HEENT:  MMM, throat without  injection Neck:  Supple, No adenopathy Chest:  CTA CV:  RRR without murmur or rub. Abd:  S, + BS, midepigastric tenderness, NT over LUQ, RUQ and bilateral lower quadrants.  Develops discomfort LUQ after abdominal exam.  No HSM or mass.       Assessment & Plan:  1.  Recurrent acute on chronic pancreatitis with a new pseudocyst in tail of pancreas.  Discussed at length concerns with patient decisions to cancel appt with GI as she felt she just needed the EGD she felt was to be planned.   Not clear exactly why there is phone tag as per patient history as she has not been working. She is not to turn her phone off during day Discussed the possibility despite lack of history of steatorrhea that pancreatic enzymes might be beneficial for her and can see where to obtain.   Again, unable to pin down as to whether she will actually take them She is to use her Tramadol and let us know when she needs a refill. Discussed to use even for what she considers milder pain that is keeping her from eating. NO ANTIIFLAMMATORIES discussed again. In contact with [facility] to work on follow up now she has cancelled the appt at end of month. She states her mother has offered to pay for  [place] [facility], which with [employer] would be about $110.  Her friend has offered to take her for $150.  She states she will check to see if her mother will cover this for her.  2.  Esophagitis/gastritis:  Seems to be better now she has been taking 40 mg Omeprazole since last visit. No interest in quitting smoking.  3.  Depression:  Will attempt to call her counselor and share I feel she should try another antidepressant.  Patient "not a pill taker" and afraid of side effects after her experience with Remeron a year ago.  4.  HM:  Refuses influenza--again not a "pill taker" despite her increased risk for death with influenza with her chronic illness and malnutrition.  5.  Nutrition:  To eat 5 smaller meals daily.  Needs to  schedule and not wait until hungry. To fill out [employer] paperwork to see if meets criteria to get free meds, including vitamins.

## 2018-10-16 ENCOUNTER — Emergency Department (HOSPITAL_COMMUNITY): Payer: Medicaid Other

## 2018-10-16 ENCOUNTER — Other Ambulatory Visit: Payer: Self-pay

## 2018-10-16 ENCOUNTER — Encounter (HOSPITAL_COMMUNITY): Payer: Self-pay | Admitting: Emergency Medicine

## 2018-10-16 ENCOUNTER — Inpatient Hospital Stay (HOSPITAL_COMMUNITY)
Admission: EM | Admit: 2018-10-16 | Discharge: 2018-10-18 | DRG: 814 | Disposition: A | Discharge status: Home / Self Care | Payer: Medicaid Other | Attending: Internal Medicine | Admitting: Internal Medicine

## 2018-10-16 DIAGNOSIS — R0781 Pleurodynia: Secondary | ICD-10-CM

## 2018-10-16 DIAGNOSIS — K863 Pseudocyst of pancreas: Secondary | ICD-10-CM | POA: Diagnosis present

## 2018-10-16 DIAGNOSIS — Z79899 Other long term (current) drug therapy: Secondary | ICD-10-CM

## 2018-10-16 DIAGNOSIS — E43 Unspecified severe protein-calorie malnutrition: Secondary | ICD-10-CM | POA: Diagnosis present

## 2018-10-16 DIAGNOSIS — R079 Chest pain, unspecified: Secondary | ICD-10-CM | POA: Diagnosis present

## 2018-10-16 DIAGNOSIS — Z681 Body mass index (BMI) 19 or less, adult: Secondary | ICD-10-CM | POA: Diagnosis not present

## 2018-10-16 DIAGNOSIS — D735 Infarction of spleen: Secondary | ICD-10-CM | POA: Diagnosis present

## 2018-10-16 DIAGNOSIS — K222 Esophageal obstruction: Secondary | ICD-10-CM | POA: Diagnosis present

## 2018-10-16 DIAGNOSIS — K2970 Gastritis, unspecified, without bleeding: Secondary | ICD-10-CM | POA: Diagnosis present

## 2018-10-16 DIAGNOSIS — K861 Other chronic pancreatitis: Secondary | ICD-10-CM | POA: Diagnosis present

## 2018-10-16 DIAGNOSIS — F1010 Alcohol abuse, uncomplicated: Secondary | ICD-10-CM | POA: Diagnosis present

## 2018-10-16 DIAGNOSIS — F17210 Nicotine dependence, cigarettes, uncomplicated: Secondary | ICD-10-CM | POA: Diagnosis present

## 2018-10-16 DIAGNOSIS — R000 Tachycardia, unspecified: Secondary | ICD-10-CM | POA: Diagnosis present

## 2018-10-16 DIAGNOSIS — I10 Essential (primary) hypertension: Secondary | ICD-10-CM | POA: Diagnosis present

## 2018-10-16 DIAGNOSIS — D500 Iron deficiency anemia secondary to blood loss (chronic): Secondary | ICD-10-CM | POA: Diagnosis present

## 2018-10-16 DIAGNOSIS — Z888 Allergy status to other drugs, medicaments and biological substances status: Secondary | ICD-10-CM | POA: Diagnosis not present

## 2018-10-16 DIAGNOSIS — K860 Alcohol-induced chronic pancreatitis: Secondary | ICD-10-CM | POA: Diagnosis present

## 2018-10-16 DIAGNOSIS — R1012 Left upper quadrant pain: Secondary | ICD-10-CM

## 2018-10-16 DIAGNOSIS — Z978 Presence of other specified devices: Secondary | ICD-10-CM

## 2018-10-16 DIAGNOSIS — F17200 Nicotine dependence, unspecified, uncomplicated: Secondary | ICD-10-CM | POA: Diagnosis present

## 2018-10-16 DIAGNOSIS — Z8669 Personal history of other diseases of the nervous system and sense organs: Secondary | ICD-10-CM

## 2018-10-16 LAB — CBC WITH DIFFERENTIAL/PLATELET
ABS IMMATURE GRANULOCYTES: 0.03 10*3/uL (ref 0.00–0.07)
BASOS ABS: 0 10*3/uL (ref 0.0–0.1)
Basophils Relative: 0 %
Eosinophils Absolute: 0 10*3/uL (ref 0.0–0.5)
Eosinophils Relative: 0 %
HCT: 30.6 % — ABNORMAL LOW (ref 36.0–46.0)
HEMOGLOBIN: 9.1 g/dL — AB (ref 12.0–15.0)
IMMATURE GRANULOCYTES: 1 %
LYMPHS ABS: 0.8 10*3/uL (ref 0.7–4.0)
LYMPHS PCT: 11 %
MCH: 30.4 pg (ref 26.0–34.0)
MCHC: 29.7 g/dL — AB (ref 30.0–36.0)
MCV: 102.3 fL — ABNORMAL HIGH (ref 80.0–100.0)
Monocytes Absolute: 0.3 10*3/uL (ref 0.1–1.0)
Monocytes Relative: 5 %
NEUTROS ABS: 5.5 10*3/uL (ref 1.7–7.7)
NEUTROS PCT: 83 %
NRBC: 0 % (ref 0.0–0.2)
Platelets: 316 10*3/uL (ref 150–400)
RBC: 2.99 MIL/uL — AB (ref 3.87–5.11)
RDW: 14.1 % (ref 11.5–15.5)
WBC: 6.7 10*3/uL (ref 4.0–10.5)

## 2018-10-16 LAB — LIPASE, BLOOD: Lipase: 38 U/L (ref 11–51)

## 2018-10-16 LAB — TROPONIN I: Troponin I: <0.03 ng/mL (ref <0.03)

## 2018-10-16 LAB — COMPREHENSIVE METABOLIC PANEL
ALK PHOS: 69 U/L (ref 38–126)
ALT: 10 U/L (ref 0–44)
AST: 16 U/L (ref 15–41)
Albumin: 2.7 g/dL — ABNORMAL LOW (ref 3.5–5.0)
Anion gap: 13 (ref 5–15)
BUN: 8 mg/dL (ref 6–20)
CHLORIDE: 101 mmol/L (ref 98–111)
CO2: 20 mmol/L — AB (ref 22–32)
Calcium: 9.1 mg/dL (ref 8.9–10.3)
Creatinine, Ser: 0.62 mg/dL (ref 0.44–1.00)
GLUCOSE: 90 mg/dL (ref 70–99)
Potassium: 4 mmol/L (ref 3.5–5.1)
Sodium: 134 mmol/L — ABNORMAL LOW (ref 135–145)
Total Bilirubin: 0.5 mg/dL (ref 0.3–1.2)
Total Protein: 8.7 g/dL — ABNORMAL HIGH (ref 6.5–8.1)

## 2018-10-16 MED ORDER — MORPHINE SULFATE (PF) 2 MG/ML IV SOLN
2.0000 mg | Freq: Once | INTRAVENOUS | Status: AC
  Administered 2018-10-16: 2 mg via INTRAVENOUS
  Filled 2018-10-16: qty 1

## 2018-10-16 MED ORDER — IOPAMIDOL (ISOVUE-370) INJECTION 76%
INTRAVENOUS | Status: AC
  Administered 2018-10-16: 80 mL
  Filled 2018-10-16: qty 100

## 2018-10-16 MED ORDER — PANTOPRAZOLE SODIUM 40 MG PO TBEC
40.0000 mg | DELAYED_RELEASE_TABLET | Freq: Every day | ORAL | Status: DC
  Administered 2018-10-16: 40 mg via ORAL
  Filled 2018-10-16 (×2): qty 1

## 2018-10-16 MED ORDER — NICOTINE 14 MG/24HR TD PT24
14.0000 mg | MEDICATED_PATCH | Freq: Every day | TRANSDERMAL | Status: DC
  Filled 2018-10-16 (×2): qty 1

## 2018-10-16 MED ORDER — METOPROLOL TARTRATE 25 MG/10 ML ORAL SUSPENSION
6.2500 mg | Freq: Every day | ORAL | Status: DC | PRN
  Filled 2018-10-16: qty 5

## 2018-10-16 MED ORDER — POLYETHYLENE GLYCOL 3350 17 G PO PACK: 17.0000 g | PACK | Freq: Every day | ORAL | Status: DC | PRN

## 2018-10-16 MED ORDER — METOPROLOL TARTRATE 25 MG PO TABS: 6.2500 mg | ORAL_TABLET | Freq: Every day | ORAL | Status: DC | PRN

## 2018-10-16 MED ORDER — SODIUM CHLORIDE 0.9 % IV BOLUS
1000.0000 mL | Freq: Once | INTRAVENOUS | Status: AC
  Administered 2018-10-16: 1000 mL via INTRAVENOUS

## 2018-10-16 MED ORDER — IPRATROPIUM-ALBUTEROL 0.5-2.5 (3) MG/3ML IN SOLN: 3.0000 mL | RESPIRATORY_TRACT | Status: DC | PRN

## 2018-10-16 MED ORDER — MORPHINE SULFATE (PF) 2 MG/ML IV SOLN: 2.0000 mg | INTRAVENOUS | Status: DC | PRN

## 2018-10-16 NOTE — ED Notes (Signed)
IV team at bedside 

## 2018-10-16 NOTE — ED Notes (Signed)
Patient transported to CT 

## 2018-10-16 NOTE — ED Triage Notes (Signed)
Pt brought in by [facility] from home with complaints of CP. Pt states it radiates to her leeft shoulder and jaw.Pt states this started last night and continued into this morning.Pt took 324 mg aspirin pta of EMS. EMS did not give nitro due to BP being 107/60. EKG unreamarkable.  Sinus tach at 106.

## 2018-10-16 NOTE — H&P (Signed)
PCP:   Julieanne Manson, MD  GI: [facility]  Chief Complaint:    HPI: this is a 57 y/o female with a complicated GI history. She has a h/o alcohol abuse, chronic pancreatitis and pseudocyst formation. She has a pancreatic drain in place since June. She had a prior drain in place that became infected and was removed. She states she has not drunk alcohol in  approx 6 months.  Today she was woken by left chest pain, difficulty breathing. Pain described as sharp and continuous. She reports she was diaphoretic. She denies heart palpitations.  It 10/10 art its worse. She radiated from the left side of her neck and shoulder to the middle of her chest and under the left breast. She has never had this before. She describes the pain as stabbing, worse with deep breathing. She denies any nausea or vomiting.  This pain is different from her pancreatitis pain. She denies any cardiac history.   Review of Systems:  The patient denies anorexia, fever, weight loss,, vision loss, decreased hearing, hoarseness, chest pain, syncope, dyspnea on exertion, peripheral edema, balance deficits, hemoptysis, abdominal pain, melena, hematochezia, severe indigestion/heartburn, hematuria, incontinence, genital sores, muscle weakness, suspicious skin lesions, transient blindness, difficulty walking, depression, unusual weight change, abnormal bleeding, enlarged lymph nodes, angioedema, and breast masses.  Past Medical History: Past Medical History:  Diagnosis Date  . Acute pancreatitis 12/05/2010, 01/2016   with complex pseudocyst, attributed to ETOH.  2017: pseudocyst, acute on chronic pancreatitis. pancreatic calcifications.  . ANEMIA, B12 DEFICIENCY 04/05/2009   macrocytic  . EXCESSIVE MENSTRUAL BLEEDING 03/18/2010   uterine fibroids. gyn eval and bx neg  . Hematemesis 01/2016   in setting pancreatitispseudocyst.  . HYPERTENSION 01/29/2008  . LEG PAIN, BILATERAL 06/22/2008  . Palpitations 01/02/2009   States heart  rate has been up to 180, though states has never been seen when having fast heart rate.  Marland Kitchen PERIMENOPAUSAL SYNDROME 08/23/2009  . RASH 12/23/2010  . 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 SYNCOPE 01/02/2009  . THYROID FUNCTION TEST, ABNORMAL 03/19/2009  . TOBACCO USE 01/25/2008  . WEIGHT LOSS-ABNORMAL 12/05/2010   Past Surgical History:  Procedure Laterality Date  . ESOPHAGOGASTRODUODENOSCOPY (EGD) WITH PROPOFOL N/A 02/28/2016   Procedure: ESOPHAGOGASTRODUODENOSCOPY (EGD) WITH PROPOFOL;  Surgeon: Napoleon Form, MD;  Location: [facility];  Service: Endoscopy;  Laterality: N/A;  . KNEE SURGERY Left 1998   Arthroscopic--for torn meniscus    Medications: Prior to Admission medications   Medication Sig Start Date End Date Taking? Authorizing Provider  ferrous gluconate (FERGON) 324 MG tablet 1 tab by mouth daily Patient not taking: Reported on 10/07/2018 09/08/18   Julieanne Manson, MD  metoprolol tartrate (LOPRESSOR) 25 MG tablet Take 0.5 tablets (12.5 mg total) by mouth daily as needed (rapid heart rate/ high blood pressure). 1/4 tablet as needed 01/19/18   Julieanne Manson, MD  omeprazole (PRILOSEC) 40 MG capsule 1 cap by mouth in the morning 1/2 hour before breakfast 09/08/18   Julieanne Manson, MD  traMADol (ULTRAM) 50 MG tablet Take 1 tablet (50 mg total) by mouth every 6 (six) hours as needed. 10/07/18   Charlynne Pander, MD  vitamin B-12 (CYANOCOBALAMIN) 1000 MCG tablet Take 1 tablet (1,000 mcg total) by mouth daily. Patient not taking: Reported on 07/18/2018 07/24/17   Julieanne Manson, MD    Allergies:   Allergies  Allergen Reactions  . Lisinopril Swelling     facial angioedema  Social History:  reports that she has been smoking cigarettes. She has a 18.00 pack-year smoking history. She has never used smokeless tobacco. She reports that she drank alcohol. She reports that she does not use drugs.  Family History: Family History   Problem Relation Age of Onset  . Hypertension Mother   . Kidney disease Mother        removed for cyst--not clear if this was a malignancy or not  . Other Mother        overweight--lost a lot following diagnosis of diabetes  . Diabetes Mother   . Heart attack Father   . Stroke Father        died age 45 yo--had multiple TIAs  . Muscular dystrophy Father   . Hypertension Brother   . Neuromuscular disorder Unknown        spinomuscular atrophy    Physical Exam: Vitals:   10/16/18 1244 10/16/18 1330 10/16/18 1430 10/16/18 1642  BP: 123/77 105/72 102/73 107/65  Pulse: (!) 105 98 (!) 102 100  Resp: (!) 26 (!) 23 20 (!) 24  Temp: 99.3 F (37.4 C)     TempSrc: Oral     SpO2: 98% 100% 100% 97%  Weight: 39 kg     Height: 4\' 11"  (1.499 m)       General:  Alert and oriented times three, cachectic female, no acute distress Eyes: PERRLA, pink conjunctiva, no scleral icterus ENT: Moist oral mucosa, neck supple, no thyromegaly Lungs: clear to ascultation, no wheeze, no crackles, no use of accessory muscles Cardiovascular: regular rate and rhythm, no regurgitation, no gallops, no murmurs. No carotid bruits, no JVD, positive chest wall tenderness, not fully reproducible Abdomen: soft, positive BS, nonspecific TTP, non-distended, no organomegaly, not an acute abdomen GU: not examined Neuro: CN II - XII grossly intact, sensation intact Musculoskeletal: strength 5/5 all extremities, no clubbing, cyanosis or edema Skin: no rash, no subcutaneous crepitation, no decubitus Psych: appropriate patient   Labs on Admission:  Recent Labs    10/16/18 1300  NA 134*  K 4.0  CL 101  CO2 20*  GLUCOSE 90  BUN 8  CREATININE 0.62  CALCIUM 9.1   Recent Labs    10/16/18 1300  AST 16  ALT 10  ALKPHOS 69  BILITOT 0.5  PROT 8.7*  ALBUMIN 2.7*   Recent Labs    10/16/18 1300  LIPASE 38   Recent Labs    10/16/18 1300  WBC 6.7  NEUTROABS 5.5  HGB 9.1*  HCT 30.6*  MCV 102.3*  PLT 316    Recent Labs    10/16/18 1300  TROPONINI <0.03   Invalid input(s): POCBNP No results for input(s): DDIMER in the last 72 hours. No results for input(s): HGBA1C in the last 72 hours. No results for input(s): CHOL, HDL, LDLCALC, TRIG, CHOLHDL, LDLDIRECT in the last 72 hours. No results for input(s): TSH, T4TOTAL, T3FREE, THYROIDAB in the last 72 hours.  Invalid input(s): FREET3 No results for input(s): VITAMINB12, FOLATE, FERRITIN, TIBC, IRON, RETICCTPCT in the last 72 hours.  Micro Results: No results found for this or any previous visit (from the past 240 hour(s)).   Radiological Exams on Admission: Dg Chest Portable 1 View  Result Date: 10/16/2018 CLINICAL DATA:  Central left chest pain and left neck pain since 7 a.m. today. EXAM: PORTABLE CHEST 1 VIEW COMPARISON:  01/10/2018 and chest CT dated 01/10/2018. FINDINGS: Normal sized heart. Calcified and mildly tortuous thoracic aorta. Interval small left pleural effusion and  minimal adjacent left basilar atelectasis. Clear right lung. Normal vascularity. Unremarkable bones. IMPRESSION: Interval small left pleural effusion and minimal adjacent left basilar atelectasis. Electronically Signed   By: Beckie Salts M.D.   On: 10/16/2018 13:24   Ct Angio Chest/abd/pel For Dissection W And/or W/wo  Result Date: 10/16/2018 CLINICAL DATA:  Chest pain radiates to left shoulder and jaw. Evaluate for aortic dissection. EXAM: CT ANGIOGRAPHY CHEST, ABDOMEN AND PELVIS TECHNIQUE: Multidetector CT imaging through the chest, abdomen and pelvis was performed using the standard protocol during bolus administration of intravenous contrast. Multiplanar reconstructed images and MIPs were obtained and reviewed to evaluate the vascular anatomy. CONTRAST:  80mL ISOVUE-370 IOPAMIDOL (ISOVUE-370) INJECTION 76% COMPARISON:  Abdomen and pelvis CT 10/07/2018.  Chest CT 01/10/2018 FINDINGS: CTA CHEST FINDINGS Cardiovascular: Precontrast imaging shows no hyperdense crescent  in the wall of the thoracic aorta. Imaging after IV contrast administration reveals no thoracic aortic aneurysm. No evidence for dissection flap within the thoracic aorta. Atherosclerotic calcification is noted in the wall of the thoracic aorta. Pulmonary arteries are well opacified without evidence for pulmonary embolic disease out to the level of subsegmental branches. Heart size normal. Trace pericardial effusion evident. Mediastinum/Nodes: No mediastinal lymphadenopathy. There is no hilar lymphadenopathy. The esophagus has normal imaging features. There is no axillary lymphadenopathy. Lungs/Pleura: The central tracheobronchial airways are patent. Centrilobular and paraseptal emphysema noted. No focal airspace consolidation. No pulmonary edema. There is a tiny left pleural effusion. Musculoskeletal: No worrisome lytic or sclerotic osseous abnormality. Review of the MIP images confirms the above findings. CTA ABDOMEN AND PELVIS FINDINGS VASCULAR Aorta: Atherosclerotic calcification noted in the wall of the abdominal aorta. No dissection of the abdominal aorta. Celiac: Widely patent. SMA: Widely patent. Renals: Patent bilaterally. Accessory left renal artery arises 2.5 cm distal to the main renal artery. IMA: Patent. Inflow: Patent. Veins: Not well evaluated due to bolus timing. Review of the MIP images confirms the above findings. NON-VASCULAR Hepatobiliary: Markedly heterogeneous liver perfusion on this early arterial phase study. There is no evidence for gallstones, gallbladder wall thickening, or pericholecystic fluid. No intrahepatic or extrahepatic biliary dilation. Pancreas: Diffuse parenchymal calcification consistent with chronic pancreatitis. Dilatation of the main duct evident. Organized 2.2 x 2.7 cm fluid collection in the tail of pancreas is similar to prior abdominal CT. Spleen: Rim enhancing low-density collection along the anterior inferior spleen is probably in infarct. Pseudocyst also  consideration. Adrenals/Urinary Tract: No adrenal nodule or mass. Kidneys unremarkable. No evidence for hydroureter. The urinary bladder appears normal for the degree of distention. Stomach/Bowel: Stomach is decompressed with apparent diffuse wall thickening. Gastric transmural pseudocyst drain evident with loop formed in the gastric antrum and second loop formed just cranial to the pancreatic head. Duodenum is normally positioned as is the ligament of Treitz. No small bowel wall thickening. No small bowel dilatation. The appendix is normal. No gross colonic mass. No colonic wall thickening. Lymphatic: Confluent soft tissue attenuation in the porta hepatis/hepatoduodenal ligament is likely related to the pancreatitis. Assessment for lymphadenopathy is limited by the amorphous soft tissue attenuation in the upper abdomen. No pelvic sidewall lymphadenopathy. Reproductive: There is no adnexal mass. Other: Small volume intraperitoneal free fluid. Musculoskeletal: No worrisome lytic or sclerotic osseous abnormality. Review of the MIP images confirms the above findings. IMPRESSION: 1. No thoracoabdominal aortic dissection. 2. No central pulmonary embolus. 3. Sequelae of chronic pancreatitis with areas of edema and amorphous soft tissue attenuation in the retroperitoneal spaces of the upper abdomen. Acute on chronic disease a possibility.  4. Stable appearance of the gastric transmural pseudocyst drain. 5. Evolution of the pseudocyst at the pancreatic tail with a second fluid collection at the inferior spleen which is probably an infarct although a second pseudocyst is also possibility. Electronically Signed   By: Kennith CenterEric  Mansell M.D.   On: 10/16/2018 15:40    Assessment/Plan Present on Admission: . Chest pain -admit to medtele -doubt cardiac etio but will cycle cardia enzymes  . 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 Chronic pancreatitis (HCC)/pseudocyst -aware. lipase level  normal  . Alcohol abuse -patient states she no longer drinks  . Essential hypertension -stable, home meds redumed  . TOBACCO USE -nicotine patch, duonebs PRN  . Pseudocyst, pancreas   Zarianna Dicarlo 10/16/2018, 5:28 PM

## 2018-10-16 NOTE — Progress Notes (Signed)
Patient ID: Nichole [name], female   DOB: 12-04-1960, 57 y.o.   MRN: [id number]   Patient with chronic pancreatitis and [facility]ural cystgastrostomy.  No significant change in CT scan today and 11/7.  No need for surgical consult - this is a Gi medical issue. Would consult GI.   [facility] specialists there.  Wilmon ArmsMatthew K. Corliss Skainssuei, MD, [facility]  General/ Trauma Surgery Beeper [phone]  10/16/2018 8:24 PM

## 2018-10-16 NOTE — ED Provider Notes (Signed)
[facility] EMERGENCY DEPARTMENT Provider Note   CSN: [id number] Arrival date & time: 10/16/18  1232     History   Chief Complaint Chief Complaint  Patient presents with  . Chest Pain    HPI Nichole [name] is a 57 y.o. female.  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  She reports that it woke her up from sleep, and radiates into the back, left shoulder, and upper left neck.  She reports that is been occurring for nearly 5 hours and is constant.  She describes the pain is sharp in nature, and she is more comfortable sitting and upright positions.  She states that it hurts to take a deep breath.  She denies any overt shortness of breath.  Denies any abdominal pain, nausea, vomiting, or diaphoresis.  Patient reports that she has had some left-sided arm and leg decreased sensation since July 2019, however it is not worsened today.  Patient denies any visual disturbance, diplopia, or blurred vision.  Patient denies any recent immobilization, hospitalization, hormone use, cancer treatment, history DVT/PE, hemoptysis, lower extremity edema or tenderness.  Patient denies any personal or family history of early MI.  Patient denies any known family history of aortic aneurysm.  Patient was treated with 325 mg of aspirin prior to arrival, but has had no other remedies for symptoms. Patient smokes approximately half a ppd. No vaping history.  Patient is a former drinking history, last drink in July 2019.  Past Medical History:  Diagnosis Date  . Acute pancreatitis 12/05/2010, 01/2016   with complex pseudocyst, attributed to ETOH.  2017: pseudocyst, acute on chronic pancreatitis. pancreatic calcifications.  . ANEMIA, B12 DEFICIENCY 04/05/2009   macrocytic  . EXCESSIVE MENSTRUAL BLEEDING 03/18/2010   uterine fibroids. gyn eval and bx neg  . Hematemesis 01/2016    in setting pancreatitispseudocyst.  . HYPERTENSION 01/29/2008  . LEG PAIN, BILATERAL 06/22/2008  . Palpitations 01/02/2009   States heart rate has been up to 180, though states has never been seen when having fast heart rate.  Marland Kitchen PERIMENOPAUSAL SYNDROME 08/23/2009  . RASH 12/23/2010  . 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 SYNCOPE 01/02/2009  . THYROID FUNCTION TEST, ABNORMAL 03/19/2009  . TOBACCO USE 01/25/2008  . WEIGHT LOSS-ABNORMAL 12/05/2010    Patient Active Problem List   Diagnosis Date Noted  . Numbness and tingling of left upper and lower extremity 07/06/2018  . Chronic pancreatitis (HCC) 05/26/2018  . 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 Anxiety and depression 07/17/2017  . Pseudocyst of pancreas   . Hematemesis   . Esophageal stricture   . Acute pancreatitis 02/27/2016  . Alcohol abuse 02/27/2016  . Does not have health insurance 06/06/2014  . History of chest pain at rest 06/06/2014  . Ganglion cyst of wrist 06/06/2014  . Visit for preventive health examination 02/22/2013  . Change in bowel habits 02/22/2013  . History of positive PPD 02/22/2013  . Neuropathy 08/26/2012  . Numbness and tingling of both legs below knees 08/26/2012  . Anterior leg pain 05/13/2012  . Edema 05/13/2012  . Pseudocyst, pancreas 02/22/2011  . GASTROINTESTINAL XRAY, ABNORMAL 12/16/2010  . Weight loss 12/05/2010  . Abdominal pain 12/03/2010  . SWEATING 09/23/2010  . Deficiency anemia 08/23/2009  . PERIMENOPAUSAL SYNDROME 08/23/2009  . ANEMIA, B12 DEFICIENCY 04/05/2009  .  THYROID FUNCTION TEST, ABNORMAL 03/19/2009  . SYNCOPE 01/02/2009  . PALPITATIONS 01/02/2009  . LEG PAIN, BILATERAL 06/22/2008  . UNSPECIFIED ANEMIA 02/14/2008  . Essential hypertension 01/29/2008  . TOBACCO USE 01/25/2008    Past Surgical History:  Procedure Laterality Date  . ESOPHAGOGASTRODUODENOSCOPY (EGD) WITH PROPOFOL N/A 02/28/2016   Procedure:  ESOPHAGOGASTRODUODENOSCOPY (EGD) WITH PROPOFOL;  Surgeon: Napoleon FormKavitha Nandigam V, MD;  Location: [facility] ENDOSCOPY;  Service: Endoscopy;  Laterality: N/A;  . KNEE SURGERY Left 1998   Arthroscopic--for torn meniscus     OB History    Gravida  0   Para  0   Term  0   Preterm  0   AB  0   Living  0     SAB  0   TAB  0   Ectopic  0   Multiple  0   Live Births               Home Medications    Prior to Admission medications   Medication Sig Start Date End Date Taking? Authorizing Provider  ferrous gluconate (FERGON) 324 MG tablet 1 tab by mouth daily Patient not taking: Reported on 10/07/2018 09/08/18   Julieanne MansonMulberry, Elizabeth, MD  metoprolol tartrate (LOPRESSOR) 25 MG tablet Take 0.5 tablets (12.5 mg total) by mouth daily as needed (rapid heart rate/ high blood pressure). 1/4 tablet as needed 01/19/18   Julieanne MansonMulberry, Elizabeth, MD  omeprazole (PRILOSEC) 40 MG capsule 1 cap by mouth in the morning 1/2 hour before breakfast 09/08/18   Julieanne MansonMulberry, Elizabeth, MD  traMADol (ULTRAM) 50 MG tablet Take 1 tablet (50 mg total) by mouth every 6 (six) hours as needed. 10/07/18   Charlynne PanderYao, David Hsienta, MD  vitamin B-12 (CYANOCOBALAMIN) 1000 MCG tablet Take 1 tablet (1,000 mcg total) by mouth daily. Patient not taking: Reported on 07/18/2018 07/24/17   Julieanne MansonMulberry, Elizabeth, MD    Family History Family History  Problem Relation Age of Onset  . Hypertension Mother   . Kidney disease Mother        removed for cyst--not clear if this was a malignancy or not  . Other Mother        overweight--lost a lot following diagnosis of diabetes  . Diabetes Mother   . Heart attack Father   . Stroke Father        died age 55 yo--had multiple TIAs  . Muscular dystrophy Father   . Hypertension Brother   . Neuromuscular disorder Unknown        spinomuscular atrophy    Social History Social History   Tobacco Use  . Smoking status: Current Every Day Smoker    Packs/day: 0.50    Years: 36.00    Pack years: 18.00      Types: Cigarettes  . Smokeless tobacco: Never Used  . Tobacco comment: Not ready to quit.  Does not want patches or other medication support.  Not a "medication"  person.    Substance Use Topics  . Alcohol use: Not Currently    Comment: States none since her first stenting spring of 2019.  . Drug use: No     Allergies   Lisinopril   Review of Systems Review of Systems  Constitutional: Positive for appetite change and unexpected weight change. Negative for chills and fever.  HENT: Negative for congestion and sore throat.   Eyes: Negative for visual disturbance.  Respiratory: Positive for chest tightness and shortness of breath. Negative for cough.   Cardiovascular: Positive for chest pain. Negative  for palpitations and leg swelling.  Gastrointestinal: Negative for abdominal pain, diarrhea, nausea and vomiting.  Genitourinary: Negative for dysuria and flank pain.  Musculoskeletal: Negative for back pain and myalgias.  Skin: Negative for rash.  Neurological: Negative for dizziness, syncope, light-headedness and headaches.     Physical Exam Updated Vital Signs BP 123/77 (BP Location: Right Arm)   Pulse (!) 105   Temp 99.3 F (37.4 C) (Oral)   Resp (!) 26   Ht 4\' 11"  (1.499 m)   Wt 39 kg   SpO2 98%   BMI 17.37 kg/m   Physical Exam  Constitutional:  Thin and chronically ill-appearing.  HENT:  Head: Normocephalic and atraumatic.  Mouth/Throat: Oropharynx is clear and moist.  Eyes: Pupils are equal, round, and reactive to light. Conjunctivae and EOM are normal.  Neck: Normal range of motion. Neck supple.  Cardiovascular: Regular rhythm, S1 normal and S2 normal.  No murmur heard. Pulses:      Radial pulses are 2+ on the right side, and 2+ on the left side.       Dorsalis pedis pulses are 1+ on the right side, and 2+ on the left side.  Tachycardic.  Pulmonary/Chest: Effort normal. She has decreased breath sounds. She has no wheezes. She has no rales.  Diminished  lung sounds in bilateral bases.  Abdominal: Soft. She exhibits no distension. There is tenderness. There is no guarding.  Mild tenderness to palpation throughout the abdomen without focal nature. Nonsurgical and no peritoneal signs.   Musculoskeletal: Normal range of motion. She exhibits no edema or deformity.  Lymphadenopathy:    She has no cervical adenopathy.  Neurological: She is alert.  Cranial nerves grossly intact. Patient moves extremities symmetrically and with good coordination.  Skin: Skin is warm and dry. No rash noted. No erythema.  Psychiatric: She has a normal mood and affect. Her behavior is normal. Judgment and thought content normal.  Nursing note and vitals reviewed.    ED Treatments / Results  Labs (all labs ordered are listed, but only abnormal results are displayed) Labs Reviewed  COMPREHENSIVE METABOLIC PANEL - Abnormal; Notable for the following components:      Result Value   Sodium 134 (*)    CO2 20 (*)    Total Protein 8.7 (*)    Albumin 2.7 (*)    All other components within normal limits  CBC WITH DIFFERENTIAL/PLATELET - Abnormal; Notable for the following components:   RBC 2.99 (*)    Hemoglobin 9.1 (*)    HCT 30.6 (*)    MCV 102.3 (*)    MCHC 29.7 (*)    All other components within normal limits  LIPASE, BLOOD  TROPONIN I  TROPONIN I    EKG EKG Interpretation  Date/Time:  Saturday October 16 2018 12:40:27 EST Ventricular Rate:  111 PR Interval:    QRS Duration: 67 QT Interval:  326 QTC Calculation: 433 R Axis:   42 Text Interpretation:  Sinus tachycardia Paired ventricular premature complexes Probable left atrial enlargement Borderline low voltage, extremity leads Borderline ST elevation, lateral leads No significant change since last tracing Confirmed by Doug Sou [phone]) on 10/16/2018 12:56:50 PM   Radiology Dg Chest Portable 1 View  Result Date: 10/16/2018 CLINICAL DATA:  Central left chest pain and left neck pain since 7  a.m. today. EXAM: PORTABLE CHEST 1 VIEW COMPARISON:  01/10/2018 and chest CT dated 01/10/2018. FINDINGS: Normal sized heart. Calcified and mildly tortuous thoracic aorta. Interval small left pleural effusion and  minimal adjacent left basilar atelectasis. Clear right lung. Normal vascularity. Unremarkable bones. IMPRESSION: Interval small left pleural effusion and minimal adjacent left basilar atelectasis. Electronically Signed   By: Beckie Salts M.D.   On: 10/16/2018 13:24   Ct Angio Chest/abd/pel For Dissection W And/or W/wo  Result Date: 10/16/2018 CLINICAL DATA:  Chest pain radiates to left shoulder and jaw. Evaluate for aortic dissection. EXAM: CT ANGIOGRAPHY CHEST, ABDOMEN AND PELVIS TECHNIQUE: Multidetector CT imaging through the chest, abdomen and pelvis was performed using the standard protocol during bolus administration of intravenous contrast. Multiplanar reconstructed images and MIPs were obtained and reviewed to evaluate the vascular anatomy. CONTRAST:  80mL ISOVUE-370 IOPAMIDOL (ISOVUE-370) INJECTION 76% COMPARISON:  Abdomen and pelvis CT 10/07/2018.  Chest CT 01/10/2018 FINDINGS: CTA CHEST FINDINGS Cardiovascular: Precontrast imaging shows no hyperdense crescent in the wall of the thoracic aorta. Imaging after IV contrast administration reveals no thoracic aortic aneurysm. No evidence for dissection flap within the thoracic aorta. Atherosclerotic calcification is noted in the wall of the thoracic aorta. Pulmonary arteries are well opacified without evidence for pulmonary embolic disease out to the level of subsegmental branches. Heart size normal. Trace pericardial effusion evident. Mediastinum/Nodes: No mediastinal lymphadenopathy. There is no hilar lymphadenopathy. The esophagus has normal imaging features. There is no axillary lymphadenopathy. Lungs/Pleura: The central tracheobronchial airways are patent. Centrilobular and paraseptal emphysema noted. No focal airspace consolidation. No  pulmonary edema. There is a tiny left pleural effusion. Musculoskeletal: No worrisome lytic or sclerotic osseous abnormality. Review of the MIP images confirms the above findings. CTA ABDOMEN AND PELVIS FINDINGS VASCULAR Aorta: Atherosclerotic calcification noted in the wall of the abdominal aorta. No dissection of the abdominal aorta. Celiac: Widely patent. SMA: Widely patent. Renals: Patent bilaterally. Accessory left renal artery arises 2.5 cm distal to the main renal artery. IMA: Patent. Inflow: Patent. Veins: Not well evaluated due to bolus timing. Review of the MIP images confirms the above findings. NON-VASCULAR Hepatobiliary: Markedly heterogeneous liver perfusion on this early arterial phase study. There is no evidence for gallstones, gallbladder wall thickening, or pericholecystic fluid. No intrahepatic or extrahepatic biliary dilation. Pancreas: Diffuse parenchymal calcification consistent with chronic pancreatitis. Dilatation of the main duct evident. Organized 2.2 x 2.7 cm fluid collection in the tail of pancreas is similar to prior abdominal CT. Spleen: Rim enhancing low-density collection along the anterior inferior spleen is probably in infarct. Pseudocyst also consideration. Adrenals/Urinary Tract: No adrenal nodule or mass. Kidneys unremarkable. No evidence for hydroureter. The urinary bladder appears normal for the degree of distention. Stomach/Bowel: Stomach is decompressed with apparent diffuse wall thickening. Gastric transmural pseudocyst drain evident with loop formed in the gastric antrum and second loop formed just cranial to the pancreatic head. Duodenum is normally positioned as is the ligament of Treitz. No small bowel wall thickening. No small bowel dilatation. The appendix is normal. No gross colonic mass. No colonic wall thickening. Lymphatic: Confluent soft tissue attenuation in the porta hepatis/hepatoduodenal ligament is likely related to the pancreatitis. Assessment for  lymphadenopathy is limited by the amorphous soft tissue attenuation in the upper abdomen. No pelvic sidewall lymphadenopathy. Reproductive: There is no adnexal mass. Other: Small volume intraperitoneal free fluid. Musculoskeletal: No worrisome lytic or sclerotic osseous abnormality. Review of the MIP images confirms the above findings. IMPRESSION: 1. No thoracoabdominal aortic dissection. 2. No central pulmonary embolus. 3. Sequelae of chronic pancreatitis with areas of edema and amorphous soft tissue attenuation in the retroperitoneal spaces of the upper abdomen. Acute on chronic disease a possibility.  4. Stable appearance of the gastric transmural pseudocyst drain. 5. Evolution of the pseudocyst at the pancreatic tail with a second fluid collection at the inferior spleen which is probably an infarct although a second pseudocyst is also possibility. Electronically Signed   By: Kennith Center M.D.   On: 10/16/2018 15:40    Procedures Procedures (including critical care time)  Medications Ordered in ED Medications  sodium chloride 0.9 % bolus 1,000 mL (has no administration in time range)  morphine 2 MG/ML injection 2 mg (has no administration in time range)     Initial Impression / Assessment and Plan / ED Course  I have reviewed the triage vital signs and the nursing notes.  Pertinent labs & imaging results that were available during my care of the patient were reviewed by me and considered in my medical decision making (see chart for details).  Clinical Course as of Oct 16 1754  Sat Oct 16, 2018  1311 Stable and improved since 9 days ago.  Hemoglobin(!): 9.1 [AM]  1312 Reviewed and do not see any free air or PTX.   DG Chest Portable 1 View [AM]  1554 No aortic dissection.  Patient exhibits possible splenic infarct versus new pseudocyst.  Patient symptoms are consistent with possible Sharl Ma sign, and will admit for workup of this.   CT Angio Chest/Abd/Pel for Dissection W and/or W/WO [AM]   1718 Spoke with Dr. Esmeralda Arthur of [facility] who will admit patient.  Appreciate her involvement in the care of this patient.   [AM]  1741 Spoke with Dr. Corliss Skains of general surgery at the request of hospital medicine.  States that surgery will consult on the patient, but likely no surgical management.  I appreciate his involvement in the care of this patient.   [AM]    Clinical Course User Index [AM] Elisha Ponder, PA-C    Patient cachectic and chronically ill-appearing.  Blood pressure stable 123/77, however she did have a reading of systolic 102 in the ambulance.Patient afebrile, but tachycardic to 105 bmp and tachypneic.  Initial diagnosis includes thoracic aortic dissection, pulmonary embolism, ACS, gastritis, acute on chronic pancreatitis.  Patient is asymmetric pulses in bilateral lower extreme is, less tremor than right, and confirmed on Doppler.  Equal pulses in bilateral upper extremities.  Patient without acute aortic dissection in the chest, abdomen and pelvis, but is a possible splenic infarct.  Patient denies IVDU, has no embolic risk factors with no documented history of atrial fibrillation.  Will admit for further work-up.  Patient accepted by [facility].  General surgery to consult, however Dr. Corliss Skains states likely no operative management.  I appreciate the involvement of these teams in the care of this patient.   This is a shared visit with Dr. Doug Sou. Patient was independently evaluated by this attending physician. Attending physician consulted in evaluation and admission management.  Final Clinical Impressions(s) / ED Diagnoses   Final diagnoses:  Left-sided chest pain  Tachycardia  Pleuritic pain    ED Discharge Orders    None       Delia Chimes 10/16/18 1757    Doug Sou, MD 10/17/18 0700

## 2018-10-16 NOTE — ED Notes (Signed)
ED TO INPATIENT HANDOFF REPORT  Name/Age/Gender Nichole [name] 57 y.o. 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 Acute pancreatitis 12/05/2010, 01/2016   with complex pseudocyst, attributed to ETOH.  2017: pseudocyst, acute on chronic pancreatitis. pancreatic calcifications.  . ANEMIA, B12 DEFICIENCY 04/05/2009   macrocytic  . EXCESSIVE MENSTRUAL BLEEDING 03/18/2010   uterine fibroids. gyn eval and bx neg  . Hematemesis 01/2016   in setting pancreatitispseudocyst.  . HYPERTENSION 01/29/2008  . LEG PAIN, BILATERAL 06/22/2008  . Palpitations 01/02/2009   States heart rate has been up to 180, though states has never been seen when having fast heart rate.  Marland Kitchen PERIMENOPAUSAL SYNDROME 08/23/2009  . RASH 12/23/2010  . 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 SYNCOPE 01/02/2009  . THYROID FUNCTION TEST, ABNORMAL 03/19/2009  . TOBACCO USE 01/25/2008  . WEIGHT LOSS-ABNORMAL 12/05/2010    Allergies Allergies  Allergen Reactions  . Lisinopril Swelling     facial angioedema     IV Location/Drains/Wounds Patient Lines/Drains/Airways Status   Active Line/Drains/Airways    Name:   Placement date:   Placement time:   Site:   Days:   Peripheral IV 10/16/18 Right Antecubital   10/16/18    1446    Antecubital   less than 1   Airway   02/28/16    1201     961          Labs/Imaging Results for orders placed or performed during the hospital encounter of 10/16/18 (from the past 48 hour(s))  Comprehensive metabolic panel     Status: Abnormal   Collection Time: 10/16/18  1:00 PM  Result Value Ref Range   Sodium 134 (L) 135 - 145 mmol/L   Potassium 4.0 3.5 - 5.1 mmol/L   Chloride 101 98 - 111 mmol/L   CO2 20 (L) 22 - 32 mmol/L   Glucose, Bld 90 70 - 99 mg/dL   BUN 8 6 - 20 mg/dL   Creatinine, Ser 0.62 0.44 - 1.00 mg/dL   Calcium 9.1 8.9 - 10.3 mg/dL   Total Protein 8.7 (H) 6.5 - 8.1 g/dL   Albumin 2.7 (L) 3.5 - 5.0 g/dL   AST 16 15 - 41 U/L   ALT 10 0 - 44 U/L   Alkaline Phosphatase 69 38 - 126 U/L   Total Bilirubin 0.5 0.3 - 1.2 mg/dL   GFR calc non Af Amer >60 >60 mL/min   GFR calc Af Amer >60 >60 mL/min    Comment: (NOTE) The eGFR has been calculated using the  CKD EPI equation. This calculation has not been validated in all clinical situations. eGFR's persistently <60 mL/min signify possible Chronic Kidney Disease.    Anion gap 13 5 - 15    Comment: Performed at [facility] [address], [place], [place] [address]  Lipase, blood     Status: None   Collection Time: 10/16/18  1:00 PM  Result Value Ref Range   Lipase 38 11 - 51 U/L    Comment: Performed at [facility] [address], [place], [place] [address]  CBC with Differential     Status: Abnormal   Collection Time: 10/16/18  1:00 PM  Result Value Ref Range   WBC 6.7 4.0 - 10.5 K/uL   RBC 2.99 (L) 3.87 - 5.11 MIL/uL   Hemoglobin 9.1 (L) 12.0 - 15.0 g/dL   HCT 30.6 (L) 36.0 - 46.0 %   MCV 102.3 (H) 80.0 - 100.0 fL   MCH 30.4 26.0 - 34.0 pg   MCHC 29.7 (L) 30.0 - 36.0 g/dL    RDW 14.1 11.5 - 15.5 %   Platelets 316 150 - 400 K/uL   nRBC 0.0 0.0 - 0.2 %   Neutrophils Relative % 83 %   Neutro Abs 5.5 1.7 - 7.7 K/uL   Lymphocytes Relative 11 %   Lymphs Abs 0.8 0.7 - 4.0 K/uL   Monocytes Relative 5 %   Monocytes Absolute 0.3 0.1 - 1.0 K/uL   Eosinophils Relative 0 %   Eosinophils Absolute 0.0 0.0 - 0.5 K/uL   Basophils Relative 0 %   Basophils Absolute 0.0 0.0 - 0.1 K/uL   Immature Granulocytes 1 %   Abs Immature Granulocytes 0.03 0.00 - 0.07 K/uL    Comment: Performed at [facility], [place] [address] [place], [place] [address]  Troponin I - Once     Status: None   Collection Time: 10/16/18  1:00 PM  Result Value Ref Range   Troponin I <0.03 <0.03 ng/mL    Comment: Performed at [facility] [address], [place], [place] [address]  Troponin I - ONCE - STAT     Status: None   Collection Time: 10/16/18  4:26 PM  Result Value Ref Range   Troponin I <0.03 <0.03 ng/mL    Comment: Performed at [facility], [place] [address] [place], [place] [address]   Dg Chest Portable 1 View  Result Date: 10/16/2018 CLINICAL DATA:  Central left chest pain and left neck pain since 7 a.m. today. EXAM: PORTABLE CHEST 1 VIEW COMPARISON:  01/10/2018 and chest CT dated 01/10/2018. FINDINGS: Normal sized heart. Calcified and mildly tortuous thoracic aorta. Interval small left pleural effusion and minimal adjacent left basilar atelectasis. Clear right lung. Normal vascularity. Unremarkable bones. IMPRESSION: Interval small left pleural effusion and minimal adjacent left basilar atelectasis. Electronically Signed   By: Claudie Revering M.D.   On: 10/16/2018 13:24   Ct Angio Chest/abd/pel For Dissection W And/or W/wo  Result Date: 10/16/2018 CLINICAL DATA:  Chest pain radiates to left shoulder and jaw. Evaluate for aortic dissection. EXAM: CT ANGIOGRAPHY CHEST, ABDOMEN AND PELVIS TECHNIQUE: Multidetector CT imaging through the chest, abdomen and pelvis was performed  using the standard protocol during bolus administration of intravenous contrast. Multiplanar reconstructed images and MIPs were obtained and reviewed to evaluate the vascular anatomy. CONTRAST:  42m ISOVUE-370 IOPAMIDOL (ISOVUE-370) INJECTION 76% COMPARISON:  Abdomen and pelvis CT 10/07/2018.  Chest CT 01/10/2018 FINDINGS: CTA CHEST FINDINGS  Cardiovascular: Precontrast imaging shows no hyperdense crescent in the wall of the thoracic aorta. Imaging after IV contrast administration reveals no thoracic aortic aneurysm. No evidence for dissection flap within the thoracic aorta. Atherosclerotic calcification is noted in the wall of the thoracic aorta. Pulmonary arteries are well opacified without evidence for pulmonary embolic disease out to the level of subsegmental branches. Heart size normal. Trace pericardial effusion evident. Mediastinum/Nodes: No mediastinal lymphadenopathy. There is no hilar lymphadenopathy. The esophagus has normal imaging features. There is no axillary lymphadenopathy. Lungs/Pleura: The central tracheobronchial airways are patent. Centrilobular and paraseptal emphysema noted. No focal airspace consolidation. No pulmonary edema. There is a tiny left pleural effusion. Musculoskeletal: No worrisome lytic or sclerotic osseous abnormality. Review of the MIP images confirms the above findings. CTA ABDOMEN AND PELVIS FINDINGS VASCULAR Aorta: Atherosclerotic calcification noted in the wall of the abdominal aorta. No dissection of the abdominal aorta. Celiac: Widely patent. SMA: Widely patent. Renals: Patent bilaterally. Accessory left renal artery arises 2.5 cm distal to the main renal artery. IMA: Patent. Inflow: Patent. Veins: Not well evaluated due to bolus timing. Review of the MIP images confirms the above findings. NON-VASCULAR Hepatobiliary: Markedly heterogeneous liver perfusion on this early arterial phase study. There is no evidence for gallstones, gallbladder wall thickening, or  pericholecystic fluid. No intrahepatic or extrahepatic biliary dilation. Pancreas: Diffuse parenchymal calcification consistent with chronic pancreatitis. Dilatation of the main duct evident. Organized 2.2 x 2.7 cm fluid collection in the tail of pancreas is similar to prior abdominal CT. Spleen: Rim enhancing low-density collection along the anterior inferior spleen is probably in infarct. Pseudocyst also consideration. Adrenals/Urinary Tract: No adrenal nodule or mass. Kidneys unremarkable. No evidence for hydroureter. The urinary bladder appears normal for the degree of distention. Stomach/Bowel: Stomach is decompressed with apparent diffuse wall thickening. Gastric transmural pseudocyst drain evident with loop formed in the gastric antrum and second loop formed just cranial to the pancreatic head. Duodenum is normally positioned as is the ligament of Treitz. No small bowel wall thickening. No small bowel dilatation. The appendix is normal. No gross colonic mass. No colonic wall thickening. Lymphatic: Confluent soft tissue attenuation in the porta hepatis/hepatoduodenal ligament is likely related to the pancreatitis. Assessment for lymphadenopathy is limited by the amorphous soft tissue attenuation in the upper abdomen. No pelvic sidewall lymphadenopathy. Reproductive: There is no adnexal mass. Other: Small volume intraperitoneal free fluid. Musculoskeletal: No worrisome lytic or sclerotic osseous abnormality. Review of the MIP images confirms the above findings. IMPRESSION: 1. No thoracoabdominal aortic dissection. 2. No central pulmonary embolus. 3. Sequelae of chronic pancreatitis with areas of edema and amorphous soft tissue attenuation in the retroperitoneal spaces of the upper abdomen. Acute on chronic disease a possibility. 4. Stable appearance of the gastric transmural pseudocyst drain. 5. Evolution of the pseudocyst at the pancreatic tail with a second fluid collection at the inferior spleen which is  probably an infarct although a second pseudocyst is also possibility. Electronically Signed   By: Misty Stanley M.D.   On: 10/16/2018 15:40    Pending Labs [employer] (From admission, onward)    Start     Ordered   Signed and Occupational hygienist morning,   R     Signed and Held   Signed and Held  CBC  Tomorrow morning,   R     Signed and Held   Signed and Held  Protime-INR  Tomorrow morning,   R     Signed and  Held   Signed and Held  Lipase, blood  Tomorrow morning,   R     Signed and Held   Signed and Held  Troponin I - Now Then Q6H  Now then every 6 hours,   R     Signed and Held          Vitals/Pain Today's Vitals   10/16/18 1244 10/16/18 1330 10/16/18 1430 10/16/18 1642  BP: 123/77 105/72 102/73 107/65  Pulse: (!) 105 98 (!) 102 100  Resp: (!) 26 (!) 23 20 (!) 24  Temp: 99.3 F (37.4 C)     TempSrc: Oral     SpO2: 98% 100% 100% 97%  Weight: 39 kg     Height: _0  (1.499 m)     PainSc: 10-Worst pain ever       Isolation Precautions No active isolations  Medications Medications  morphine 2 MG/ML injection 2 mg (has no administration in time range)  sodium chloride 0.9 % bolus 1,000 mL (0 mLs Intravenous Stopped 10/16/18 1701)  morphine 2 MG/ML injection 2 mg (2 mg Intravenous Given 10/16/18 1257)  iopamidol (ISOVUE-370) 76 % injection (80 mLs  Contrast Given 10/16/18 1501)  morphine 2 MG/ML injection 2 mg (2 mg Intravenous Given 10/16/18 1726)    Mobility walks

## 2018-10-16 NOTE — Progress Notes (Signed)
Patient arrived to unit via transport personnel from ED. Walked from stretcher to bed in room. Oriented patient to room and [facility] policies. Patient concerned over taking narcotics to manage pain and getting addicted to them. Text paged resident on call to explore options of non narcotic pain management possibilities.

## 2018-10-16 NOTE — ED Provider Notes (Addendum)
Complains of left anterior chest pain rating to her neck awaken her from sleep this morning.  Pain is worse with taking deep breath, pleuritic in nature not made better by anything.  Treated with aspirin prior to coming here.  No nausea or vomiting.  No shortness of breath.  No other associated symptoms.  On exam alert, chronically ill-appearing appears mildly uncomfortable .  HEENT exam no facial asymmetry neck supple no bruit heart mildly tachycardic regular rhythm lungs clear to auscultation abdomen soft nontender extremities without edema.   Doug SouJacubowitz, Philip Kotlyar, MD 10/16/18 1303    Doug SouJacubowitz, Emunah Texidor, MD 10/17/18 [phone]

## 2018-10-16 NOTE — ED Notes (Signed)
Attempted IV access x2, IV access not obtained. IV team order placed

## 2018-10-17 DIAGNOSIS — R0781 Pleurodynia: Secondary | ICD-10-CM

## 2018-10-17 DIAGNOSIS — F17200 Nicotine dependence, unspecified, uncomplicated: Secondary | ICD-10-CM

## 2018-10-17 DIAGNOSIS — F1010 Alcohol abuse, uncomplicated: Secondary | ICD-10-CM

## 2018-10-17 DIAGNOSIS — I10 Essential (primary) hypertension: Secondary | ICD-10-CM

## 2018-10-17 DIAGNOSIS — K861 Other chronic pancreatitis: Secondary | ICD-10-CM

## 2018-10-17 DIAGNOSIS — K863 Pseudocyst of pancreas: Secondary | ICD-10-CM

## 2018-10-17 LAB — BASIC METABOLIC PANEL
Anion gap: 11 (ref 5–15)
BUN: 6 mg/dL (ref 6–20)
CALCIUM: 8.7 mg/dL — AB (ref 8.9–10.3)
CHLORIDE: 103 mmol/L (ref 98–111)
CO2: 21 mmol/L — AB (ref 22–32)
CREATININE: 0.65 mg/dL (ref 0.44–1.00)
GFR calc Af Amer: >60 mL/min (ref >60)
GFR calc non Af Amer: >60 mL/min (ref >60)
GLUCOSE: 83 mg/dL (ref 70–99)
Potassium: 3.8 mmol/L (ref 3.5–5.1)
Sodium: 135 mmol/L (ref 135–145)

## 2018-10-17 LAB — PROTIME-INR
INR: 1.28
Prothrombin Time: 15.9 seconds — ABNORMAL HIGH (ref 11.4–15.2)

## 2018-10-17 LAB — CBC
HEMATOCRIT: 25.4 % — AB (ref 36.0–46.0)
Hemoglobin: 7.5 g/dL — ABNORMAL LOW (ref 12.0–15.0)
MCH: 29.3 pg (ref 26.0–34.0)
MCHC: 29.5 g/dL — AB (ref 30.0–36.0)
MCV: 99.2 fL (ref 80.0–100.0)
PLATELETS: 294 10*3/uL (ref 150–400)
RBC: 2.56 MIL/uL — ABNORMAL LOW (ref 3.87–5.11)
RDW: 13.8 % (ref 11.5–15.5)
WBC: 7 10*3/uL (ref 4.0–10.5)
nRBC: 0 % (ref 0.0–0.2)

## 2018-10-17 LAB — LIPASE, BLOOD: Lipase: 30 U/L (ref 11–51)

## 2018-10-17 LAB — TROPONIN I
Troponin I: <0.03 ng/mL (ref <0.03)
Troponin I: <0.03 ng/mL (ref <0.03)

## 2018-10-17 MED ORDER — MORPHINE SULFATE (PF) 2 MG/ML IV SOLN: 2.0000 mg | INTRAVENOUS | Status: DC | PRN

## 2018-10-17 MED ORDER — HYDROCODONE-ACETAMINOPHEN 5-325 MG PO TABS
1.0000 | ORAL_TABLET | ORAL | Status: DC | PRN
  Administered 2018-10-17 – 2018-10-18 (×3): 1 via ORAL
  Filled 2018-10-17 (×3): qty 1

## 2018-10-17 MED ORDER — KETOROLAC TROMETHAMINE 30 MG/ML IJ SOLN: 30.0000 mg | Freq: Four times a day (QID) | INTRAMUSCULAR | Status: DC | PRN

## 2018-10-17 MED ORDER — SODIUM CHLORIDE 0.9 % IV SOLN: INTRAVENOUS | Status: DC

## 2018-10-17 MED ORDER — HYDROCODONE-ACETAMINOPHEN 5-325 MG PO TABS: 1.0000 | ORAL_TABLET | ORAL | Status: DC | PRN

## 2018-10-17 MED ORDER — DICLOFENAC SODIUM 1 % TD GEL
2.0000 g | Freq: Four times a day (QID) | TRANSDERMAL | Status: DC
  Administered 2018-10-17: 2 g via TOPICAL
  Filled 2018-10-17: qty 100

## 2018-10-17 MED ORDER — KETOROLAC TROMETHAMINE 30 MG/ML IJ SOLN
30.0000 mg | Freq: Four times a day (QID) | INTRAMUSCULAR | Status: DC | PRN
  Administered 2018-10-17: 30 mg via INTRAVENOUS
  Filled 2018-10-17: qty 1

## 2018-10-17 MED ORDER — ACETAMINOPHEN 325 MG PO TABS: 650.0000 mg | ORAL_TABLET | Freq: Four times a day (QID) | ORAL | Status: DC | PRN

## 2018-10-17 MED ORDER — TRAMADOL HCL 50 MG PO TABS
50.0000 mg | ORAL_TABLET | Freq: Four times a day (QID) | ORAL | Status: DC | PRN
  Administered 2018-10-18: 50 mg via ORAL
  Filled 2018-10-17: qty 1

## 2018-10-17 NOTE — Progress Notes (Addendum)
Messaged Dr Butler Denmarkizwan via internal chat. Asked MD about pt's concerns for having real food, and wants something else for pain. States she cannot take tylenol or NSAIDS. States she does not want morphine. Pt is frustrated that she feels nothing is being done and she is being billed for nothing. If no response from MD, will page via Amion as well. Offered pt emotional support.   After continued questions about eating and pain medications. Paged Dr Butler Denmarkizwan.  Returned phone call and spoke to MD. Clent Demarkelayed to pt that MD has spoken to her regarding these topics. GI Doc will need to see pt first prior to any further changes. No orders for diet changes at this time. See new orders.

## 2018-10-17 NOTE — Progress Notes (Signed)
PROGRESS NOTE    Nichole [name]   ZOX:[id number]  DOB: 1961-04-03  DOA: 10/16/2018 PCP: Julieanne Manson, MD   HPI:  Nichole [name] is a 57 y/o with h/o alcohol abuse, chronic pancreatitis and pseudocyst formation. She has a pancreatic drain in place since June. She had a prior drain in place that became infected and was removed. She states she has not drunk alcohol in  approx 6 months.  Today she was woken by left chest pain, difficulty breathing. Pain described as sharp and continuous. She reports she was diaphoretic. She denies heart palpitations.  It 10/10 art its worse. She radiated from the left side of her neck and shoulder to the middle of her chest and under the left breast. She has never had this before. She describes the pain as stabbing, worse with deep breathing. She denies any nausea or vomiting.  This pain is different from her pancreatitis pain. She denies any cardiac history.  CTA>  No PE - Sequelae of chronic pancreatitis with areas of edema and amorphous soft tissue attenuation in the retroperitoneal spaces of the upper abdomen. Acute on chronic disease a possibility. - Stable appearance of the gastric transmural pseudocyst drain. - Evolution of the pseudocyst at the pancreatic tail with a second fluid collection at the inferior spleen which is probably an infarct although a second pseudocyst is also possibility.   Subjective: Pain - points to neck, entire sternal area and left upper quadrant. Worse with coughing and moving around but states it is "internal"     Assessment & Plan:   Principal Problem:   Chest pain and abdominal pain - ? If radiating from splenic infarct and ? If partly musculoskeletal- add Voltaren gel for point tenderness in LUQ - f/u GI eval- d/wDr Chales Abrahams - cont clears for now  Active Problems: Gastritis - d/c PRN Toradol - cont PPI daily    Chronic pancreatitis,  Pseudocyst, pancreas - GI to follow    TOBACCO USE - Nicotine  patch, PRN Duobens    Essential hypertension, [place]  - uses Lopressor PRN at home which is resumed    Alcohol abuse   Esophageal stricture   Underweight/ severe protein calorie malnutrition  Body mass index is 17.17 kg/m.    DVT prophylaxis: SCDs Code Status: Full code Family Communication:  Disposition Plan: f/u GI eval Consultants:   GI Procedures:   none Antimicrobials:  Anti-infectives (From admission, onward)   None       Objective: Vitals:   10/16/18 1642 10/16/18 1900 10/16/18 2004 10/17/18 0435  BP: 107/65 104/66 107/64 (!) 101/54  Pulse: 100 95 (!) 102 (!) 108  Resp: (!) 24 18 20 18   Temp:   98.3 F (36.8 C) 99.7 F (37.6 C)  TempSrc:   Oral Oral  SpO2: 97% 98% 98% 97%  Weight:   38.6 kg   Height:   4\' 11"  (1.499 m)     Intake/Output Summary (Last 24 hours) at 10/17/2018 1109 Last data filed at 10/17/2018 1012 Gross per 24 hour  Intake 0 ml  Output -  Net 0 ml   Filed Weights   10/16/18 1244 10/16/18 2004  Weight: 39 kg 38.6 kg    Examination: General exam: Appears comfortable  HEENT: PERRLA, oral mucosa moist, no sclera icterus or thrush Respiratory system: Clear to auscultation. Respiratory effort normal. Cardiovascular system: S1 & S2 heard, RRR.   Gastrointestinal system: Abdomen soft, non-tender, nondistended. Normal bowel sounds.- point tenderness in LUQ in mid-clavicular  line Central nervous system: Alert and oriented. No focal neurological deficits. Extremities: No cyanosis, clubbing or edema Skin: No rashes or ulcers Psychiatry:  Mood & affect appropriate.     Data Reviewed: I have personally reviewed following labs and imaging studies  CBC: Recent Labs  Lab 10/16/18 1300 10/17/18 0300  WBC 6.7 7.0  NEUTROABS 5.5  --   HGB 9.1* 7.5*  HCT 30.6* 25.4*  MCV 102.3* 99.2  PLT 316 294   Basic Metabolic Panel: Recent Labs  Lab 10/16/18 1300 10/17/18 0300  NA 134* 135  K 4.0 3.8  CL 101 103  CO2 20* 21*    GLUCOSE 90 83  BUN 8 6  CREATININE 0.62 0.65  CALCIUM 9.1 8.7*   GFR: Estimated Creatinine Clearance: 48.4 mL/min (by C-G formula based on SCr of 0.65 mg/dL). Liver Function Tests: Recent Labs  Lab 10/16/18 1300  AST 16  ALT 10  ALKPHOS 69  BILITOT 0.5  PROT 8.7*  ALBUMIN 2.7*   Recent Labs  Lab 10/16/18 1300 10/17/18 0300  LIPASE 38 30   No results for input(s): AMMONIA in the last 168 hours. Coagulation Profile: Recent Labs  Lab 10/17/18 0300  INR 1.28   Cardiac Enzymes: Recent Labs  Lab 10/16/18 1300 10/16/18 1626 10/16/18 2131 10/17/18 0300 10/17/18 0944  TROPONINI <0.03 <0.03 <0.03 <0.03 <0.03   BNP (last 3 results) No results for input(s): PROBNP in the last 8760 hours. HbA1C: No results for input(s): HGBA1C in the last 72 hours. CBG: No results for input(s): GLUCAP in the last 168 hours. Lipid Profile: No results for input(s): CHOL, HDL, LDLCALC, TRIG, CHOLHDL, LDLDIRECT in the last 72 hours. Thyroid Function Tests: No results for input(s): TSH, T4TOTAL, FREET4, T3FREE, THYROIDAB in the last 72 hours. Anemia Panel: No results for input(s): VITAMINB12, FOLATE, FERRITIN, TIBC, IRON, RETICCTPCT in the last 72 hours. Urine analysis:    Component Value Date/Time   COLORURINE YELLOW 07/17/2018 2247   APPEARANCEUR CLEAR 07/17/2018 2247   LABSPEC 1.033 (H) 07/17/2018 2247   PHURINE 5.0 07/17/2018 2247   GLUCOSEU NEGATIVE 07/17/2018 2247   HGBUR NEGATIVE 07/17/2018 2247   HGBUR 1+ 12/03/2010 1444   BILIRUBINUR SMALL (A) 07/17/2018 2247   BILIRUBINUR n 02/21/2011   KETONESUR 80 (A) 07/17/2018 2247   PROTEINUR >=300 (A) 07/17/2018 2247   UROBILINOGEN 0.2 02/21/2011   UROBILINOGEN 1.0 12/03/2010 1444   NITRITE NEGATIVE 07/17/2018 2247   LEUKOCYTESUR NEGATIVE 07/17/2018 2247   Sepsis Labs: @LABRCNTIP (procalcitonin:4,lacticidven:4) )No results found for this or any previous visit (from the past 240 hour(s)).       Radiology Studies: Dg Chest  Portable 1 View  Result Date: 10/16/2018 CLINICAL DATA:  Central left chest pain and left neck pain since 7 a.m. today. EXAM: PORTABLE CHEST 1 VIEW COMPARISON:  01/10/2018 and chest CT dated 01/10/2018. FINDINGS: Normal sized heart. Calcified and mildly tortuous thoracic aorta. Interval small left pleural effusion and minimal adjacent left basilar atelectasis. Clear right lung. Normal vascularity. Unremarkable bones. IMPRESSION: Interval small left pleural effusion and minimal adjacent left basilar atelectasis. Electronically Signed   By: Beckie Salts M.D.   On: 10/16/2018 13:24   Ct Angio Chest/abd/pel For Dissection W And/or W/wo  Result Date: 10/16/2018 CLINICAL DATA:  Chest pain radiates to left shoulder and jaw. Evaluate for aortic dissection. EXAM: CT ANGIOGRAPHY CHEST, ABDOMEN AND PELVIS TECHNIQUE: Multidetector CT imaging through the chest, abdomen and pelvis was performed using the standard protocol during bolus administration of intravenous contrast. Multiplanar reconstructed  images and MIPs were obtained and reviewed to evaluate the vascular anatomy. CONTRAST:  80mL ISOVUE-370 IOPAMIDOL (ISOVUE-370) INJECTION 76% COMPARISON:  Abdomen and pelvis CT 10/07/2018.  Chest CT 01/10/2018 FINDINGS: CTA CHEST FINDINGS Cardiovascular: Precontrast imaging shows no hyperdense crescent in the wall of the thoracic aorta. Imaging after IV contrast administration reveals no thoracic aortic aneurysm. No evidence for dissection flap within the thoracic aorta. Atherosclerotic calcification is noted in the wall of the thoracic aorta. Pulmonary arteries are well opacified without evidence for pulmonary embolic disease out to the level of subsegmental branches. Heart size normal. Trace pericardial effusion evident. Mediastinum/Nodes: No mediastinal lymphadenopathy. There is no hilar lymphadenopathy. The esophagus has normal imaging features. There is no axillary lymphadenopathy. Lungs/Pleura: The central  tracheobronchial airways are patent. Centrilobular and paraseptal emphysema noted. No focal airspace consolidation. No pulmonary edema. There is a tiny left pleural effusion. Musculoskeletal: No worrisome lytic or sclerotic osseous abnormality. Review of the MIP images confirms the above findings. CTA ABDOMEN AND PELVIS FINDINGS VASCULAR Aorta: Atherosclerotic calcification noted in the wall of the abdominal aorta. No dissection of the abdominal aorta. Celiac: Widely patent. SMA: Widely patent. Renals: Patent bilaterally. Accessory left renal artery arises 2.5 cm distal to the main renal artery. IMA: Patent. Inflow: Patent. Veins: Not well evaluated due to bolus timing. Review of the MIP images confirms the above findings. NON-VASCULAR Hepatobiliary: Markedly heterogeneous liver perfusion on this early arterial phase study. There is no evidence for gallstones, gallbladder wall thickening, or pericholecystic fluid. No intrahepatic or extrahepatic biliary dilation. Pancreas: Diffuse parenchymal calcification consistent with chronic pancreatitis. Dilatation of the main duct evident. Organized 2.2 x 2.7 cm fluid collection in the tail of pancreas is similar to prior abdominal CT. Spleen: Rim enhancing low-density collection along the anterior inferior spleen is probably in infarct. Pseudocyst also consideration. Adrenals/Urinary Tract: No adrenal nodule or mass. Kidneys unremarkable. No evidence for hydroureter. The urinary bladder appears normal for the degree of distention. Stomach/Bowel: Stomach is decompressed with apparent diffuse wall thickening. Gastric transmural pseudocyst drain evident with loop formed in the gastric antrum and second loop formed just cranial to the pancreatic head. Duodenum is normally positioned as is the ligament of Treitz. No small bowel wall thickening. No small bowel dilatation. The appendix is normal. No gross colonic mass. No colonic wall thickening. Lymphatic: Confluent soft tissue  attenuation in the porta hepatis/hepatoduodenal ligament is likely related to the pancreatitis. Assessment for lymphadenopathy is limited by the amorphous soft tissue attenuation in the upper abdomen. No pelvic sidewall lymphadenopathy. Reproductive: There is no adnexal mass. Other: Small volume intraperitoneal free fluid. Musculoskeletal: No worrisome lytic or sclerotic osseous abnormality. Review of the MIP images confirms the above findings. IMPRESSION: 1. No thoracoabdominal aortic dissection. 2. No central pulmonary embolus. 3. Sequelae of chronic pancreatitis with areas of edema and amorphous soft tissue attenuation in the retroperitoneal spaces of the upper abdomen. Acute on chronic disease a possibility. 4. Stable appearance of the gastric transmural pseudocyst drain. 5. Evolution of the pseudocyst at the pancreatic tail with a second fluid collection at the inferior spleen which is probably an infarct although a second pseudocyst is also possibility. Electronically Signed   By: Kennith CenterEric  Mansell M.D.   On: 10/16/2018 15:40      Scheduled Meds: . nicotine  14 mg Transdermal Daily  . pantoprazole  40 mg Oral Daily   Continuous Infusions:   LOS: 1 day    Time spent in minutes: 35    Calvert CantorSaima Vannie Hilgert, MD  [facility] Pager: [email or web address] Password [email or web address] 10/17/2018, 11:09 AM

## 2018-10-17 NOTE — Consult Note (Signed)
Consultation  Referring Provider: Dr. Butler Denmark   Primary Care Physician:  Julieanne Manson, MD Primary Gastroenterologist: [facility]     Reason for Consultation: Abdominal pain, history of chronic pancreatitis status post pancreatic drain in June 2019              HPI:   Nichole [name] is a 57 y.o. female with a past medical history as listed below including alcohol abuse, chronic pancreatitis and pseudocyst formation, status post pancreatic drain since June 2019, who presented to the hospital yesterday with a complaint of chest pain and difficulty breathing.  We were consulted today in regards to chest/abdominal pain with questions of this relating to splenic infarct.    At time of admission patient described being awoken by left chest pain with difficulty breathing described as sharp and continuous.  Also reports diaphoresis.  This pain is rated as a 10/10 at its worst and radiated from the left side of her neck and shoulder to the mid of her chest and under the left breast.  Describes this pain is different from her pancreatitis pain.  Most recent GI history: 05/28/2018 EUS at [facility]: Endosonographic imaging of the pancreas showed sonographic changes consistent with severe chronic pancreatitis, pseudocyst was seen in the pancreatic head, the diagnosis was a pancreatic pseudocyst, fine-needle aspiration for fluid performed, cystoduodenostomy was performed 04/22/2018 EGD at [facility]: 2 previously placed cystogastrostomy stents were found on the posterior wall of the gastric body (1 plastic, 1 fully covered metal), stent removal both was accomplished with a Raptor grasping device (Other notes in chart review) 02/01/2016 EGD, Dr. Lavon Paganini: Benign-appearing esophageal stenosis dilated      Past Medical History:  Diagnosis Date  . Acute pancreatitis 12/05/2010, 01/2016   with complex pseudocyst, attributed to ETOH.  2017: pseudocyst, acute on chronic pancreatitis. pancreatic  calcifications.  . ANEMIA, B12 DEFICIENCY 04/05/2009   macrocytic  . EXCESSIVE MENSTRUAL BLEEDING 03/18/2010   uterine fibroids. gyn eval and bx neg  . Hematemesis 01/2016   in setting pancreatitispseudocyst.  . HYPERTENSION 01/29/2008  . LEG PAIN, BILATERAL 06/22/2008  . Palpitations 01/02/2009   States heart rate has been up to 180, though states has never been seen when having fast heart rate.  Marland Kitchen PERIMENOPAUSAL SYNDROME 08/23/2009  . RASH 12/23/2010  . 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 SYNCOPE 01/02/2009  . THYROID FUNCTION TEST, ABNORMAL 03/19/2009  . TOBACCO USE 01/25/2008  . WEIGHT LOSS-ABNORMAL 12/05/2010         Past Surgical History:  Procedure Laterality Date  . ESOPHAGOGASTRODUODENOSCOPY (EGD) WITH PROPOFOL N/A 02/28/2016   Procedure: ESOPHAGOGASTRODUODENOSCOPY (EGD) WITH PROPOFOL;  Surgeon: Napoleon Form, MD;  Location: [facility];  Service: Endoscopy;  Laterality: N/A;  . KNEE SURGERY Left 1998   Arthroscopic--for torn meniscus         Family History  Problem Relation Age of Onset  . Hypertension Mother   . Kidney disease Mother        removed for cyst--not clear if this was a malignancy or not  . Other Mother        overweight--lost a lot following diagnosis of diabetes  . Diabetes Mother   . Heart attack Father   . Stroke Father        died age 39 yo--had multiple TIAs  . Muscular dystrophy Father   . Hypertension Brother   . Neuromuscular disorder Unknown  spinomuscular atrophy    Social History        Tobacco Use  . Smoking status: Current Every Day Smoker    Packs/day: 0.50    Years: 36.00    Pack years: 18.00    Types: Cigarettes  . Smokeless tobacco: Never Used  . Tobacco comment: Not ready to quit.  Does not want patches or other medication support.  Not a "medication"  person.    Substance Use Topics  . Alcohol use: Not Currently    Comment: States none since  her first stenting spring of 2019.  . Drug use: No           Prior to Admission medications   Medication Sig Start Date End Date Taking? Authorizing Provider  metoprolol tartrate (LOPRESSOR) 25 MG tablet Take 0.5 tablets (12.5 mg total) by mouth daily as needed (rapid heart rate/ high blood pressure). 1/4 tablet as needed Patient taking differently: Take 6.25 mg by mouth daily as needed (rapid heart rate/ high blood pressure (1/4 tablet)).  01/19/18  Yes Julieanne Manson, MD  omeprazole (PRILOSEC) 40 MG capsule 1 cap by mouth in the morning 1/2 hour before breakfast Patient taking differently: Take 40 mg by mouth daily before breakfast. 1/2 hour before breakfast 09/08/18  Yes Julieanne Manson, MD  traMADol (ULTRAM) 50 MG tablet Take 1 tablet (50 mg total) by mouth every 6 (six) hours as needed. Patient taking differently: Take 50 mg by mouth every 6 (six) hours as needed (pain).  10/07/18  Yes Charlynne Pander, MD  ferrous gluconate (FERGON) 324 MG tablet 1 tab by mouth daily Patient not taking: Reported on 10/16/2018 09/08/18   Julieanne Manson, MD  vitamin B-12 (CYANOCOBALAMIN) 1000 MCG tablet Take 1 tablet (1,000 mcg total) by mouth daily. Patient not taking: Reported on 07/18/2018 07/24/17   Julieanne Manson, MD             Current Facility-Administered Medications  Medication Dose Route Frequency Provider Last Rate Last Dose  . acetaminophen (TYLENOL) tablet 650 mg  650 mg Oral Q6H PRN Calvert Cantor, MD      . diclofenac sodium (VOLTAREN) 1 % transdermal gel 2 g  2 g Topical QID Rizwan, Ladell Heads, MD      . HYDROcodone-acetaminophen (NORCO/VICODIN) 5-325 MG per tablet 1 tablet  1 tablet Oral Q4H PRN Rizwan, Ladell Heads, MD      . ipratropium-albuterol (DUONEB) 0.5-2.5 (3) MG/3ML nebulizer solution 3 mL  3 mL Nebulization Q4H PRN Crosley, Debby, MD      . metoprolol tartrate (LOPRESSOR) 25 mg/10 mL oral suspension 6.25 mg  6.25 mg Oral Daily PRN Crosley, Debby, MD      .  morphine 2 MG/ML injection 2 mg  2 mg Intravenous Q4H PRN Rizwan, Saima, MD      . nicotine (NICODERM CQ - dosed in mg/24 hours) patch 14 mg  14 mg Transdermal Daily Crosley, Debby, MD      . pantoprazole (PROTONIX) EC tablet 40 mg  40 mg Oral Daily Crosley, Debby, MD   40 mg at 10/16/18 2027  . polyethylene glycol (MIRALAX / GLYCOLAX) packet 17 g  17 g Oral Daily PRN Gery Pray, MD             Allergies as of 10/16/2018 - Review Complete 10/16/2018  Allergen Reaction Noted  . Lisinopril Swelling 08/06/2010     Review of Systems:    Constitutional: No weight loss, fever, chills, weakness or fatigue HEENT: Eyes: No change in vision  Ears, Nose, Throat:  No change in hearing or congestion Skin: No rash or itching Cardiovascular: No chest pain, chest pressure or palpitations   Respiratory: No SOB or cough Gastrointestinal: See HPI and otherwise negative Genitourinary: No dysuria or change in urinary frequency Neurological: No headache, dizziness or syncope Musculoskeletal: No new muscle or joint pain Hematologic: No bleeding or bruising Psychiatric: No history of depression or anxiety     Physical Exam:  Vital signs in last 24 hours: Temp:  [98.3 F (36.8 C)-99.7 F (37.6 C)] 99.7 F (37.6 C) (11/17 0435) Pulse Rate:  [95-108] 108 (11/17 0435) Resp:  [18-26] 18 (11/17 0435) BP: (101-123)/(54-77) 101/54 (11/17 0435) SpO2:  [97 %-100 %] 97 % (11/17 0435) Weight:  [38.6 kg-39 kg] 38.6 kg (11/16 2004) Last BM Date: 10/15/18 General:   Pleasant female appears to be in NAD, Well developed, Well nourished, alert and cooperative Head:  Normocephalic and atraumatic. Eyes:   PEERL, EOMI. No icterus. Conjunctiva pink. Ears:  Normal auditory acuity. Neck:  Supple Throat: Oral cavity and pharynx without inflammation, swelling or lesion. Teeth in good condition. Lungs: Respirations even and unlabored. Lungs clear to auscultation bilaterally.   No wheezes,  crackles, or rhonchi.  Heart: Normal S1, S2. No MRG. Regular rate and rhythm. No peripheral edema, cyanosis or pallor.  Abdomen:  Soft, nondistended, nontender. No rebound or guarding. Normal bowel sounds. No appreciable masses or hepatomegaly. Rectal:  Not performed.  Msk:  Symmetrical without gross deformities. Peripheral pulses intact.  Extremities:  Without edema, no deformity or joint abnormality. Normal ROM, normal sensation. Neurologic:  Alert and  oriented x4;  grossly normal neurologically. CN II-XII intact.  Skin:   Dry and intact without significant lesions or rashes. Psychiatric: Demonstrates good judgement and reason without abnormal affect or behaviors.   LAB RESULTS: Recent Labs (last 2 labs)       Recent Labs    10/16/18 1300 10/17/18 0300  WBC 6.7 7.0  HGB 9.1* 7.5*  HCT 30.6* 25.4*  PLT 316 294     BMET Recent Labs (last 2 labs)       Recent Labs    10/16/18 1300 10/17/18 0300  NA 134* 135  K 4.0 3.8  CL 101 103  CO2 20* 21*  GLUCOSE 90 83  BUN 8 6  CREATININE 0.62 0.65  CALCIUM 9.1 8.7*     LFT Recent Labs (last 2 labs)      Recent Labs    10/16/18 1300  PROT 8.7*  ALBUMIN 2.7*  AST 16  ALT 10  ALKPHOS 69  BILITOT 0.5     PT/INR Recent Labs (last 2 labs)      Recent Labs    10/17/18 0300  LABPROT 15.9*  INR 1.28      STUDIES:  Imaging Results (Last 48 hours)  Dg Chest Portable 1 View  Result Date: 10/16/2018 CLINICAL DATA:  Central left chest pain and left neck pain since 7 a.m. today. EXAM: PORTABLE CHEST 1 VIEW COMPARISON:  01/10/2018 and chest CT dated 01/10/2018. FINDINGS: Normal sized heart. Calcified and mildly tortuous thoracic aorta. Interval small left pleural effusion and minimal adjacent left basilar atelectasis. Clear right lung. Normal vascularity. Unremarkable bones. IMPRESSION: Interval small left pleural effusion and minimal adjacent left basilar atelectasis. Electronically Signed   By: Beckie SaltsSteven  Reid  M.D.   On: 10/16/2018 13:24   Ct Angio Chest/abd/pel For Dissection W And/or W/wo  Result Date: 10/16/2018 CLINICAL DATA:  Chest pain radiates to left  shoulder and jaw. Evaluate for aortic dissection. EXAM: CT ANGIOGRAPHY CHEST, ABDOMEN AND PELVIS TECHNIQUE: Multidetector CT imaging through the chest, abdomen and pelvis was performed using the standard protocol during bolus administration of intravenous contrast. Multiplanar reconstructed images and MIPs were obtained and reviewed to evaluate the vascular anatomy. CONTRAST:  80mL ISOVUE-370 IOPAMIDOL (ISOVUE-370) INJECTION 76% COMPARISON:  Abdomen and pelvis CT 10/07/2018.  Chest CT 01/10/2018 FINDINGS: CTA CHEST FINDINGS Cardiovascular: Precontrast imaging shows no hyperdense crescent in the wall of the thoracic aorta. Imaging after IV contrast administration reveals no thoracic aortic aneurysm. No evidence for dissection flap within the thoracic aorta. Atherosclerotic calcification is noted in the wall of the thoracic aorta. Pulmonary arteries are well opacified without evidence for pulmonary embolic disease out to the level of subsegmental branches. Heart size normal. Trace pericardial effusion evident. Mediastinum/Nodes: No mediastinal lymphadenopathy. There is no hilar lymphadenopathy. The esophagus has normal imaging features. There is no axillary lymphadenopathy. Lungs/Pleura: The central tracheobronchial airways are patent. Centrilobular and paraseptal emphysema noted. No focal airspace consolidation. No pulmonary edema. There is a tiny left pleural effusion. Musculoskeletal: No worrisome lytic or sclerotic osseous abnormality. Review of the MIP images confirms the above findings. CTA ABDOMEN AND PELVIS FINDINGS VASCULAR Aorta: Atherosclerotic calcification noted in the wall of the abdominal aorta. No dissection of the abdominal aorta. Celiac: Widely patent. SMA: Widely patent. Renals: Patent bilaterally. Accessory left renal artery arises 2.5 cm  distal to the main renal artery. IMA: Patent. Inflow: Patent. Veins: Not well evaluated due to bolus timing. Review of the MIP images confirms the above findings. NON-VASCULAR Hepatobiliary: Markedly heterogeneous liver perfusion on this early arterial phase study. There is no evidence for gallstones, gallbladder wall thickening, or pericholecystic fluid. No intrahepatic or extrahepatic biliary dilation. Pancreas: Diffuse parenchymal calcification consistent with chronic pancreatitis. Dilatation of the main duct evident. Organized 2.2 x 2.7 cm fluid collection in the tail of pancreas is similar to prior abdominal CT. Spleen: Rim enhancing low-density collection along the anterior inferior spleen is probably in infarct. Pseudocyst also consideration. Adrenals/Urinary Tract: No adrenal nodule or mass. Kidneys unremarkable. No evidence for hydroureter. The urinary bladder appears normal for the degree of distention. Stomach/Bowel: Stomach is decompressed with apparent diffuse wall thickening. Gastric transmural pseudocyst drain evident with loop formed in the gastric antrum and second loop formed just cranial to the pancreatic head. Duodenum is normally positioned as is the ligament of Treitz. No small bowel wall thickening. No small bowel dilatation. The appendix is normal. No gross colonic mass. No colonic wall thickening. Lymphatic: Confluent soft tissue attenuation in the porta hepatis/hepatoduodenal ligament is likely related to the pancreatitis. Assessment for lymphadenopathy is limited by the amorphous soft tissue attenuation in the upper abdomen. No pelvic sidewall lymphadenopathy. Reproductive: There is no adnexal mass. Other: Small volume intraperitoneal free fluid. Musculoskeletal: No worrisome lytic or sclerotic osseous abnormality. Review of the MIP images confirms the above findings. IMPRESSION: 1. No thoracoabdominal aortic dissection. 2. No central pulmonary embolus. 3. Sequelae of chronic pancreatitis  with areas of edema and amorphous soft tissue attenuation in the retroperitoneal spaces of the upper abdomen. Acute on chronic disease a possibility. 4. Stable appearance of the gastric transmural pseudocyst drain. 5. Evolution of the pseudocyst at the pancreatic tail with a second fluid collection at the inferior spleen which is probably an infarct although a second pseudocyst is also possibility. Electronically Signed   By: Kennith Center M.D.   On: 10/16/2018 15:40     Impression / Plan:  Impression: 1.  Chronic pancreatitis with history of pseudocyst: CT showing sequelae of chronic pancreatitis with areas of edema and amorphous soft tissue attenuation in the retroperitoneal spaces of the upper abdomen, stable appearance of the gastric transmural pseudocyst drain, evolution of the pseudocyst at the pancreatic tail with a second fluid collection at the inferior spleen which was thought probably an infarct although secondary pseudocyst was also a possibility, most recent EUS at [facility] in June with cystoduodenostomy, lipase normal at 30 2.  Alcohol abuse: 3.  Chest pain: 4.  ?Splenic infarct:  Plan: 1.  Advance diet to low-fat diet. 2.  No need for any GI intervention or any surgical intervention at the present time.  Patient has a follow-up appointment at [facility] 11/25. 3.  She is not having any evidence of exocrine insufficiency, so no need for pancreatic enzymes currently.  Thank you for your kind consultation, we will continue to follow.  Violet Baldy [facility]  10/17/2018, 11:42 AM    Attending physician's note   I have taken an interval history, reviewed the chart and examined the patient. I agree with the Advanced Practitioner's note, impression and recommendations.   57 year old with chronic ETOH  pancreatitis with pseudocyst s/p cystoduodenostomy 05/28/2018, had 2 cystogastrostomy stents (plastic and metal stent) s/p removal 03/2018. Has FU appointment at [facility] on  10/25/2018.  Adm with shortness of breath and pain during deep breaths.  CT scan showed evidence of chronic pancreatitis and questionable splenic infarct.  Surgery has been consulted.  No indication for splenectomy.  Currently patient is pain-free.  Wants to eat. Plan: Advance diet to low-fat diet as tolerated/follow-up at [facility]/no need for pancreatic enzymes/no indication for any GI intervention.  Will sign off for now.  Edman Circle, MD

## 2018-10-17 NOTE — Consult Note (Signed)
Dr. Chales AbrahamsGupta saw this patient. Complete note in chart.   Hyacinth MeekerJennifer Shawnette Augello, PA-C

## 2018-10-17 NOTE — Progress Notes (Addendum)
Paged MD regarding pt's BP. Pt is asymptomatic at this time. Awaiting callback.   1411 paged 2nd time. Awaiting callback.   1416 MD made aware. Encouraged PO intake. See new orders.

## 2018-10-17 NOTE — Progress Notes (Signed)
Pt refusing to eat clear liquid tray. States that "It's not real food". Pt also refusing bath. States that she "doesn't have the strength without real food". Pt educated to speak with MD about her concerns as the doctors are rounding this morning.

## 2018-10-18 ENCOUNTER — Telehealth: Payer: Self-pay | Admitting: Internal Medicine

## 2018-10-18 DIAGNOSIS — R000 Tachycardia, unspecified: Secondary | ICD-10-CM

## 2018-10-18 DIAGNOSIS — R1012 Left upper quadrant pain: Secondary | ICD-10-CM

## 2018-10-18 LAB — BASIC METABOLIC PANEL WITH GFR
Anion gap: 8 (ref 5–15)
BUN: 9 mg/dL (ref 6–20)
CO2: 21 mmol/L — ABNORMAL LOW (ref 22–32)
Calcium: 8.2 mg/dL — ABNORMAL LOW (ref 8.9–10.3)
Chloride: 103 mmol/L (ref 98–111)
Creatinine, Ser: 0.89 mg/dL (ref 0.44–1.00)
GFR calc Af Amer: >60 mL/min
GFR calc non Af Amer: >60 mL/min
Glucose, Bld: 267 mg/dL — ABNORMAL HIGH (ref 70–99)
Potassium: 3.9 mmol/L (ref 3.5–5.1)
Sodium: 132 mmol/L — ABNORMAL LOW (ref 135–145)

## 2018-10-18 LAB — CBC
HCT: 23.3 % — ABNORMAL LOW (ref 36.0–46.0)
Hemoglobin: 7.2 g/dL — ABNORMAL LOW (ref 12.0–15.0)
MCH: 30.4 pg (ref 26.0–34.0)
MCHC: 30.9 g/dL (ref 30.0–36.0)
MCV: 98.3 fL (ref 80.0–100.0)
Platelets: 249 10*3/uL (ref 150–400)
RBC: 2.37 MIL/uL — ABNORMAL LOW (ref 3.87–5.11)
RDW: 13.6 % (ref 11.5–15.5)
WBC: 4.8 10*3/uL (ref 4.0–10.5)
nRBC: 0 % (ref 0.0–0.2)

## 2018-10-18 MED ORDER — DICLOFENAC SODIUM 1 % TD GEL: 2.0000 g | Freq: Four times a day (QID) | TRANSDERMAL | 0 refills | Status: DC

## 2018-10-18 MED ORDER — PANCRELIPASE (LIP-PROT-AMYL) 12000-38000 UNITS PO CPEP: ORAL_CAPSULE | ORAL | 11 refills | Status: DC

## 2018-10-18 MED ORDER — NICOTINE 14 MG/24HR TD PT24: 14.0000 mg | MEDICATED_PATCH | Freq: Every day | TRANSDERMAL | 0 refills | Status: DC

## 2018-10-18 MED ORDER — POLYETHYLENE GLYCOL 3350 17 G PO PACK: 17.0000 g | PACK | Freq: Every day | ORAL | 0 refills | Status: DC | PRN

## 2018-10-18 NOTE — Progress Notes (Signed)
Patient discharged to home. Verbalizes understanding of all discharge instructions including discharge medications and follow up MD visits. Patient instructed to follow up with PCP in one week.

## 2018-10-18 NOTE — Telephone Encounter (Signed)
Starting Creon/pancreatic enzymes with meals and snacks due to weight issues and also to see if helps with pain.

## 2018-10-18 NOTE — Telephone Encounter (Signed)
Dr. Delrae AlfredMulberry spoke with patient and information was given.

## 2018-10-18 NOTE — Discharge Instructions (Signed)
Please see your doctor in 1 wk.  You were cared for by a hospitalist during your hospital stay. If you have any questions about your discharge medications or the care you received while you were in the hospital after you are discharged, you can call the unit and asked to speak with the hospitalist on call if the hospitalist that took care of you is not available. Once you are discharged, your primary care physician will handle any further medical issues.   Please note that NO REFILLS for any discharge medications will be authorized once you are discharged, as it is imperative that you return to your primary care physician (or establish a relationship with a primary care physician if you do not have one) for your aftercare needs so that they can reassess your need for medications and monitor your lab values.  Please take all your medications with you for your next visit with your Primary MD. Please ask your Primary MD to get all Hospital records sent to his/her office. Please request your Primary MD to go over all hospital test results at the follow up.   If you experience worsening of your admission symptoms, develop shortness of breath, chest pain, suicidal or homicidal thoughts or a life threatening emergency, you must seek medical attention immediately by calling 911 or calling your MD.   Bonita QuinYou must read the complete instructions/literature along with all the possible adverse reactions/side effects for all the medicines you take including new medications that have been prescribed to you. Take new medicines after you have completely understood and accpet all the possible adverse reactions/side effects.    Do not drive when taking pain medications or sedatives.     Do not take more than prescribed Pain, Sleep and Anxiety Medications   If you have smoked or chewed Tobacco in the last 2 yrs please stop. Stop any regular alcohol  and or recreational drug use.   Wear Seat belts while driving.

## 2018-10-18 NOTE — Discharge Summary (Signed)
Physician Discharge Summary  Nichole [name] ZOX:[id number] DOB: 1961-07-06 DOA: 10/16/2018  PCP: Julieanne Manson, MD  Admit date: 10/16/2018 Discharge date: 10/18/2018  Admitted From: home Disposition:  home   Recommendations for Outpatient Follow-up:  1. F/u CBC in 1 wk- declined a blood transfusion today  Discharge Condition:  stable   CODE STATUS:  Full code   Consultations:  GI    Discharge Diagnoses:  Principal Problem:   Intermittent left upper quadrant abdominal pain Active Problems:   TOBACCO USE   Essential hypertension   Pseudocyst, pancreas   Alcohol abuse   Esophageal stricture   Chronic pancreatitis (HCC)   Splenic infarct     Brief Summary: Nichole [name] is a 57 y/o with h/o alcohol abuse, chronic pancreatitis and pseudocyst formation. She has a pancreatic drain in place since June. She had a prior drain in place that became infected and was removed. She states she has not drunk alcohol in approx 6 months.  Today she was woken by left chest pain, difficulty breathing. Pain described as sharp and continuous. She reports she was diaphoretic. She denies heart palpitations. It 10/10 art its worse. She radiated from the left side of her neck and shoulder to the middle of her chest and under the left breast. She has never had this before. She describes the pain as stabbing, worse with deep breathing. She denies any nausea or vomiting.  This pain is different from her pancreatitis pain. She denies any cardiac history.  CTA>  No PE - Sequelae of chronic pancreatitis with areas of edema and amorphous soft tissue attenuation in the retroperitoneal spaces of the upper abdomen. Acute on chronic disease a possibility. - Stable appearance of the gastric transmural pseudocyst drain. - Evolution of the pseudocyst at the pancreatic tail with a second fluid collection at the inferior spleen which is probably an infarct although a second pseudocyst is also  possibility.   Hospital Course:  Principal Problem:   Chest pain and abdominal pain- possibly splenic infarct - left abdominal pain is worse with coughing, taking a deep breath and moving - chest pain has resolved (may be reflux?) - ? If radiating from splenic infarct and ? If partly musculoskeletal- add Voltaren gel for point tenderness in LUQ -  Dr Chales Abrahams has evaluated the patient- she was not in pain when he saw her - she is tolerating a solid diet now without nausea- her pain is on and off, at times is sharp and knife like and at times just an ache and mostly on her left just below her rib cage.  - She states Voltaren gel has helped a little- no further inpatient work up at his time    Active Problems:  Anemia - ? Due to chronic blood loss vs dilutional due to IVF given in the ED ( 2 L of NS) - the patient has been offered a blood transfusion and she has declined it - I have recommended she continue her Iron and have a CBC done in 1 wk.  Gastritis - cont PPI daily     Chronic pancreatitis,  Pseudocyst, pancreas - cont outpt management    TOBACCO USE - Nicotine patch, PRN Duobens    Essential hypertension, [place]  - uses Lopressor PRN at home which is resumed    Underweight/ severe protein calorie malnutrition  Body mass index is 17.17 kg/m.  Discharge Exam: Vitals:   10/17/18 2131 10/18/18 0455  BP: (!) 92/55 96/62  Pulse: 91 90  Resp: 18 20  Temp: 98.6 F (37 C) 98.2 F (36.8 C)  SpO2: 99% 99%   Vitals:   10/17/18 1345 10/17/18 1415 10/17/18 2131 10/18/18 0455  BP: (!) 79/48 107/78 (!) 92/55 96/62  Pulse: 92 75 91 90  Resp: 16  18 20   Temp: 98.7 F (37.1 C)  98.6 F (37 C) 98.2 F (36.8 C)  TempSrc: Oral  Oral Oral  SpO2: 98%  99% 99%  Weight:      Height:        General: Pt is alert, awake, not in acute distress Cardiovascular: RRR, S1/S2 +, no rubs, no gallops Respiratory: CTA bilaterally, no wheezing, no rhonchi Abdominal: Soft, NT,  ND, bowel sounds +-  mild point tenderness under rib cage in mid clavicular line Extremities: no edema, no cyanosis   Discharge Instructions  Discharge Instructions    Diet - low sodium heart healthy   Complete by:  As directed    Increase activity slowly   Complete by:  As directed      Allergies as of 10/18/2018      Reactions   Lisinopril Swelling    facial angioedema      Medication List    TAKE these medications   diclofenac sodium 1 % Gel Commonly known as:  VOLTAREN Apply 2 g topically 4 (four) times daily.   ferrous gluconate 324 MG tablet Commonly known as:  FERGON 1 tab by mouth daily   lipase/protease/amylase 81191 units Cpep capsule Commonly known as:  CREON 1 cap before and after each meal and 1 cap with each snack   metoprolol tartrate 25 MG tablet Commonly known as:  LOPRESSOR Take 0.5 tablets (12.5 mg total) by mouth daily as needed (rapid heart rate/ high blood pressure). 1/4 tablet as needed What changed:    how much to take  reasons to take this  additional instructions   nicotine 14 mg/24hr patch Commonly known as:  NICODERM CQ - dosed in mg/24 hours Place 1 patch (14 mg total) onto the skin daily. Start taking on:  10/19/2018   omeprazole 40 MG capsule Commonly known as:  PRILOSEC 1 cap by mouth in the morning 1/2 hour before breakfast What changed:    how much to take  how to take this  when to take this  additional instructions   polyethylene glycol packet Commonly known as:  MIRALAX / GLYCOLAX Take 17 g by mouth daily as needed for mild constipation.   traMADol 50 MG tablet Commonly known as:  ULTRAM Take 1 tablet (50 mg total) by mouth every 6 (six) hours as needed. What changed:  reasons to take this   vitamin B-12 1000 MCG tablet Commonly known as:  CYANOCOBALAMIN Take 1 tablet (1,000 mcg total) by mouth daily.       Allergies  Allergen Reactions  . Lisinopril Swelling     facial angioedema      Procedures/Studies:    Ct Abdomen Pelvis W Contrast  Result Date: 10/07/2018 CLINICAL DATA:  Left upper quadrant abdominal pain for 1.5 weeks. Loss of appetite. Personal history of pancreatitis. EXAM: CT ABDOMEN AND PELVIS WITH CONTRAST TECHNIQUE: Multidetector CT imaging of the abdomen and pelvis was performed using the standard protocol following bolus administration of intravenous contrast. CONTRAST:  75mL OMNIPAQUE IOHEXOL 300 MG/ML SOLN, 30mL OMNIPAQUE IOHEXOL 300 MG/ML SOLN COMPARISON:  CT of the abdomen and pelvis 07/18/2018 FINDINGS: Lower chest: The lung bases are clear without focal nodule, mass, or airspace disease. The  heart size is normal. No significant pleural or pericardial effusion is present. Hepatobiliary: Mild intra and extrahepatic biliary dilation is present. No focal hepatic lesions are evident. Gallbladder is within normal limits. Pancreas: Extensive pancreatic calcifications are present. A double-J catheter drains a pancreatic pseudocyst into the antrum of the stomach. A new multiloculated fluid collection is present at the tail of the pancreas. The more medial component measures 2.1 x 1.9 x 1.9 cm. A more lateral component extends along the inferior and anterior aspect of the spleen, measuring 1.6 x 3.5 x 3.0 cm. Additional fluid inflammatory changes extend into the left pericolic gutter. Spleen: The spleen is otherwise unremarkable. There is some fluid about the spleen. Adrenals/Urinary Tract: Adrenal glands are normal bilaterally. Kidneys and ureters are within normal limits. There is no stone or mass lesion. The urinary bladder is within normal limits. Stomach/Bowel: The stomach and duodenum are within normal limits. Small bowel is unremarkable. The terminal ileum is within normal limits. The ascending and transverse colon are normal. Inflammatory changes are present at the splenic flexure. The descending and sigmoid colon are normal. Vascular/Lymphatic: Atherosclerotic  calcifications are present in the aorta and branch vessels. There is no aneurysm. Reproductive: Uterus and bilateral adnexa are unremarkable. Other: A small amount of free fluid is evident within the anatomic pelvis on the right. There is no free air. No significant ventral herniation is present. Musculoskeletal: Vertebral body heights alignment are maintained. No focal lytic or blastic lesions are present. Bony pelvis is within normal limits. Hips are located and normal bilaterally. IMPRESSION: 1. Chronic pancreatitis with new cystic lesions at the tail the pancreas. This suggests acute on chronic pancreatitis or interval development of new pseudocysts. 2. Chronic calcific pancreatitis is stable. 3. Stable drainage catheter of pseudocyst into the antrum of the stomach. 4. Mild intra and extrahepatic biliary dilation. Gallbladder is normal. 5.  Aortic Atherosclerosis (ICD10-I70.0). Electronically Signed   By: Marin Roberts M.D.   On: 10/07/2018 22:22   Dg Chest Portable 1 View  Result Date: 10/16/2018 CLINICAL DATA:  Central left chest pain and left neck pain since 7 a.m. today. EXAM: PORTABLE CHEST 1 VIEW COMPARISON:  01/10/2018 and chest CT dated 01/10/2018. FINDINGS: Normal sized heart. Calcified and mildly tortuous thoracic aorta. Interval small left pleural effusion and minimal adjacent left basilar atelectasis. Clear right lung. Normal vascularity. Unremarkable bones. IMPRESSION: Interval small left pleural effusion and minimal adjacent left basilar atelectasis. Electronically Signed   By: Beckie Salts M.D.   On: 10/16/2018 13:24   Ct Angio Chest/abd/pel For Dissection W And/or W/wo  Result Date: 10/16/2018 CLINICAL DATA:  Chest pain radiates to left shoulder and jaw. Evaluate for aortic dissection. EXAM: CT ANGIOGRAPHY CHEST, ABDOMEN AND PELVIS TECHNIQUE: Multidetector CT imaging through the chest, abdomen and pelvis was performed using the standard protocol during bolus administration of  intravenous contrast. Multiplanar reconstructed images and MIPs were obtained and reviewed to evaluate the vascular anatomy. CONTRAST:  80mL ISOVUE-370 IOPAMIDOL (ISOVUE-370) INJECTION 76% COMPARISON:  Abdomen and pelvis CT 10/07/2018.  Chest CT 01/10/2018 FINDINGS: CTA CHEST FINDINGS Cardiovascular: Precontrast imaging shows no hyperdense crescent in the wall of the thoracic aorta. Imaging after IV contrast administration reveals no thoracic aortic aneurysm. No evidence for dissection flap within the thoracic aorta. Atherosclerotic calcification is noted in the wall of the thoracic aorta. Pulmonary arteries are well opacified without evidence for pulmonary embolic disease out to the level of subsegmental branches. Heart size normal. Trace pericardial effusion evident. Mediastinum/Nodes: No mediastinal lymphadenopathy.  There is no hilar lymphadenopathy. The esophagus has normal imaging features. There is no axillary lymphadenopathy. Lungs/Pleura: The central tracheobronchial airways are patent. Centrilobular and paraseptal emphysema noted. No focal airspace consolidation. No pulmonary edema. There is a tiny left pleural effusion. Musculoskeletal: No worrisome lytic or sclerotic osseous abnormality. Review of the MIP images confirms the above findings. CTA ABDOMEN AND PELVIS FINDINGS VASCULAR Aorta: Atherosclerotic calcification noted in the wall of the abdominal aorta. No dissection of the abdominal aorta. Celiac: Widely patent. SMA: Widely patent. Renals: Patent bilaterally. Accessory left renal artery arises 2.5 cm distal to the main renal artery. IMA: Patent. Inflow: Patent. Veins: Not well evaluated due to bolus timing. Review of the MIP images confirms the above findings. NON-VASCULAR Hepatobiliary: Markedly heterogeneous liver perfusion on this early arterial phase study. There is no evidence for gallstones, gallbladder wall thickening, or pericholecystic fluid. No intrahepatic or extrahepatic biliary  dilation. Pancreas: Diffuse parenchymal calcification consistent with chronic pancreatitis. Dilatation of the main duct evident. Organized 2.2 x 2.7 cm fluid collection in the tail of pancreas is similar to prior abdominal CT. Spleen: Rim enhancing low-density collection along the anterior inferior spleen is probably in infarct. Pseudocyst also consideration. Adrenals/Urinary Tract: No adrenal nodule or mass. Kidneys unremarkable. No evidence for hydroureter. The urinary bladder appears normal for the degree of distention. Stomach/Bowel: Stomach is decompressed with apparent diffuse wall thickening. Gastric transmural pseudocyst drain evident with loop formed in the gastric antrum and second loop formed just cranial to the pancreatic head. Duodenum is normally positioned as is the ligament of Treitz. No small bowel wall thickening. No small bowel dilatation. The appendix is normal. No gross colonic mass. No colonic wall thickening. Lymphatic: Confluent soft tissue attenuation in the porta hepatis/hepatoduodenal ligament is likely related to the pancreatitis. Assessment for lymphadenopathy is limited by the amorphous soft tissue attenuation in the upper abdomen. No pelvic sidewall lymphadenopathy. Reproductive: There is no adnexal mass. Other: Small volume intraperitoneal free fluid. Musculoskeletal: No worrisome lytic or sclerotic osseous abnormality. Review of the MIP images confirms the above findings. IMPRESSION: 1. No thoracoabdominal aortic dissection. 2. No central pulmonary embolus. 3. Sequelae of chronic pancreatitis with areas of edema and amorphous soft tissue attenuation in the retroperitoneal spaces of the upper abdomen. Acute on chronic disease a possibility. 4. Stable appearance of the gastric transmural pseudocyst drain. 5. Evolution of the pseudocyst at the pancreatic tail with a second fluid collection at the inferior spleen which is probably an infarct although a second pseudocyst is also  possibility. Electronically Signed   By: Kennith Center M.D.   On: 10/16/2018 15:40     The results of significant diagnostics from this hospitalization (including imaging, microbiology, ancillary and laboratory) are listed below for reference.     Microbiology: No results found for this or any previous visit (from the past 240 hour(s)).   Labs: BNP (last 3 results) No results for input(s): BNP in the last 8760 hours. Basic Metabolic Panel: Recent Labs  Lab 10/16/18 1300 10/17/18 0300 10/18/18 0246  NA 134* 135 132*  K 4.0 3.8 3.9  CL 101 103 103  CO2 20* 21* 21*  GLUCOSE 90 83 267*  BUN 8 6 9   CREATININE 0.62 0.65 0.89  CALCIUM 9.1 8.7* 8.2*   Liver Function Tests: Recent Labs  Lab 10/16/18 1300  AST 16  ALT 10  ALKPHOS 69  BILITOT 0.5  PROT 8.7*  ALBUMIN 2.7*   Recent Labs  Lab 10/16/18 1300 10/17/18 0300  LIPASE  38 30   No results for input(s): AMMONIA in the last 168 hours. CBC: Recent Labs  Lab 10/16/18 1300 10/17/18 0300 10/18/18 0246  WBC 6.7 7.0 4.8  NEUTROABS 5.5  --   --   HGB 9.1* 7.5* 7.2*  HCT 30.6* 25.4* 23.3*  MCV 102.3* 99.2 98.3  PLT 316 294 249   Cardiac Enzymes: Recent Labs  Lab 10/16/18 1300 10/16/18 1626 10/16/18 2131 10/17/18 0300 10/17/18 0944  TROPONINI <0.03 <0.03 <0.03 <0.03 <0.03   BNP: Invalid input(s): POCBNP CBG: No results for input(s): GLUCAP in the last 168 hours. D-Dimer No results for input(s): DDIMER in the last 72 hours. Hgb A1c No results for input(s): HGBA1C in the last 72 hours. Lipid Profile No results for input(s): CHOL, HDL, LDLCALC, TRIG, CHOLHDL, LDLDIRECT in the last 72 hours. Thyroid function studies No results for input(s): TSH, T4TOTAL, T3FREE, THYROIDAB in the last 72 hours.  Invalid input(s): FREET3 Anemia work up No results for input(s): VITAMINB12, FOLATE, FERRITIN, TIBC, IRON, RETICCTPCT in the last 72 hours. Urinalysis    Component Value Date/Time   COLORURINE YELLOW 07/17/2018  2247   APPEARANCEUR CLEAR 07/17/2018 2247   LABSPEC 1.033 (H) 07/17/2018 2247   PHURINE 5.0 07/17/2018 2247   GLUCOSEU NEGATIVE 07/17/2018 2247   HGBUR NEGATIVE 07/17/2018 2247   HGBUR 1+ 12/03/2010 1444   BILIRUBINUR SMALL (A) 07/17/2018 2247   BILIRUBINUR n 02/21/2011   KETONESUR 80 (A) 07/17/2018 2247   PROTEINUR >=300 (A) 07/17/2018 2247   UROBILINOGEN 0.2 02/21/2011   UROBILINOGEN 1.0 12/03/2010 1444   NITRITE NEGATIVE 07/17/2018 2247   LEUKOCYTESUR NEGATIVE 07/17/2018 2247   Sepsis Labs Invalid input(s): PROCALCITONIN,  WBC,  LACTICIDVEN Microbiology No results found for this or any previous visit (from the past 240 hour(s)).   Time coordinating discharge in minutes: 60  SIGNED:   Calvert CantorSaima Schwanda Zima, MD  [facility] 10/18/2018, 11:26 AM Pager   If 7PM-7AM, please contact night-coverage [email or web address] Password [email or web address]

## 2018-11-17 ENCOUNTER — Ambulatory Visit: Payer: Medicaid Other | Admitting: Internal Medicine

## 2018-11-17 ENCOUNTER — Encounter: Payer: Self-pay | Admitting: Internal Medicine

## 2018-11-17 VITALS — BP 122/68 | HR 78 | Resp 12 | Ht 59.0 in | Wt 86.0 lb

## 2018-11-17 DIAGNOSIS — L299 Pruritus, unspecified: Secondary | ICD-10-CM

## 2018-11-17 DIAGNOSIS — K860 Alcohol-induced chronic pancreatitis: Secondary | ICD-10-CM

## 2018-11-17 DIAGNOSIS — Z9189 Other specified personal risk factors, not elsewhere classified: Secondary | ICD-10-CM | POA: Diagnosis not present

## 2018-11-17 NOTE — Progress Notes (Signed)
   Subjective:    Patient ID: Nichole [name], female    DOB: 1961/12/01, 57 y.o.   MRN: [id number]  HPI   1.  Recurrent pancreatic pseudocysts/chronic pancreatitis:  Hospitalized Nov 16th through the 18th for left sided chest pain and found to have  Underwent Esophageal US of pancreas.  Pancreas is quite calcified.  They did not see any new pseudocysts or infarct of pancreatic tail.  Just received her gabapentin today.  [facility]  [facility] as they can offer no further treatment for her condition other than sending her for possible transplant.   She has not started Creon yet as she is trying to get her birth certificate to apply for MAP   Itching left thorax  Needs FMLA filled out.  She has not completed her part  Needs dental care.  Refused flu and pneumococcal again.  Current Meds  Medication Sig  . diclofenac sodium (VOLTAREN) 1 % GEL Apply 2 g topically 4 (four) times daily.  . metoprolol tartrate (LOPRESSOR) 25 MG tablet Take 0.5 tablets (12.5 mg total) by mouth daily as needed (rapid heart rate/ high blood pressure). 1/4 tablet as needed (Patient taking differently: Take 6.25 mg by mouth daily as needed (rapid heart rate/ high blood pressure (1/4 tablet)). )  . omeprazole (PRILOSEC) 40 MG capsule 1 cap by mouth in the morning 1/2 hour before breakfast (Patient taking differently: Take 40 mg by mouth daily before breakfast. 1/2 hour before breakfast)    Allergies  Allergen Reactions  . Lisinopril Swelling     facial angioedema   Review of Systems     Objective:   Physical Exam   Cachectic appearing female.  NAD HEENT:  PERRL, EOMI.   Lungs:  CTA CV: RRR without murmur or rub.  Radial pulses normal and equal Abd:  S, mild to moderate epigastric tenderness, no rebound or peritoneal signs.  No HSM or mass.  + BS LE: No edema Skin of back flaking, more so on right.  No rash in area of itching.        Assessment & Plan:  1.  Recurrent acute on chronic pancreatitis with history of recurrent pseudocyst formation.  To call if gabapentin at 300 mg makes her to drowsy and needs to start at a lower dose. Needs to get going with pancreatic enzymes/MAP application  2.  Need for dental care   No current pain.  Dental referral.  3.  FMLA:  To fill out her portion and I will complete  4.  Dry skin:  To use moisturizing cream such as Eucerin for eczema relief after showers.  5.  HM:  Refusing immunizations again.  Try to switch February appt to CPE.  Needs pap and mammogram.

## 2019-01-04 ENCOUNTER — Other Ambulatory Visit: Payer: Self-pay

## 2019-01-04 MED ORDER — PANCRELIPASE (LIP-PROT-AMYL) 24000-76000 UNITS PO CPEP: ORAL_CAPSULE | ORAL | 11 refills | Status: DC

## 2019-01-04 MED ORDER — DEXLANSOPRAZOLE 60 MG PO CPDR: 60.0000 mg | DELAYED_RELEASE_CAPSULE | Freq: Every day | ORAL | 11 refills | Status: DC

## 2019-01-12 ENCOUNTER — Other Ambulatory Visit: Payer: Self-pay | Admitting: Internal Medicine

## 2019-01-12 ENCOUNTER — Encounter: Payer: Self-pay | Admitting: Internal Medicine

## 2019-01-12 ENCOUNTER — Ambulatory Visit: Payer: Medicaid Other | Admitting: Internal Medicine

## 2019-01-12 VITALS — BP 120/72 | HR 88 | Resp 12 | Ht 59.0 in | Wt 87.0 lb

## 2019-01-12 DIAGNOSIS — Z0000 Encounter for general adult medical examination without abnormal findings: Secondary | ICD-10-CM

## 2019-01-12 DIAGNOSIS — Z1239 Encounter for other screening for malignant neoplasm of breast: Secondary | ICD-10-CM

## 2019-01-12 NOTE — Progress Notes (Signed)
Subjective:    Patient ID: Nichole [name], female   DOB: 1961/01/13, 58 y.o.   MRN: [id number]   HPI  CPE with pap  1.  Pap:  Last in 2012, normal and always normal.  Maternal Grandmother died of cervical cancer age 63.  2.  Mammogram:  Maybe 2006 for cutaneous breast infection.  Normal.  She is not sure if anything since.  Normal in 2006.  Dense breasts.  No family history of breast cancer.  3.  Osteoprevention: 2-4 servings of cheese or milk daily.  Walks twice weekly.  4.  Guaiac Cards:  Never.  5.  Colonoscopy:  Never.  No family history of colon cancer.  Has had EGD in 2017 with esophageal stenosis and again in 2019 for gastric-pseudocyst stenting and decompression.    6.  Immunizations:  Appears no Td since 2010.  Refuses Tdap.  Did not get influenza vaccine--refuses.  Adamantly refuses influenza and unwilling to say why or if she is afraid of something regarding the vaccine.    7.  Glucose/Cholesterol:  No elevated glucoses in past several years.  Cholesterol panel in 2017 was fine save for low HDL at 32.  Lipid Panel     Component Value Date/Time   CHOL 143 02/28/2016 1409   TRIG 141 02/28/2016 1409   HDL 32 (L) 02/28/2016 1409   CHOLHDL 4.5 02/28/2016 1409   VLDL 28 02/28/2016 1409   LDLCALC 83 02/28/2016 1409    No outpatient medications have been marked as taking for the 01/12/19 encounter (Appointment) with Julieanne Manson, MD.   Allergies  Allergen Reactions  . Lisinopril Swelling     facial angioedema   Past Medical History:  Diagnosis Date  . Acute pancreatitis 12/05/2010, 01/2016   with complex pseudocyst, attributed to ETOH.  2017: pseudocyst, acute on chronic pancreatitis. pancreatic calcifications.  . ANEMIA, B12 DEFICIENCY 04/05/2009   macrocytic  . EXCESSIVE MENSTRUAL BLEEDING 03/18/2010   uterine fibroids. gyn eval and bx neg  . Hematemesis 01/2016   in setting pancreatitispseudocyst.  . HYPERTENSION 01/29/2008  . LEG PAIN, BILATERAL 06/22/2008    . Palpitations 01/02/2009   States heart rate has been up to 180, though states has never been seen when having fast heart rate.  Marland Kitchen PERIMENOPAUSAL SYNDROME 08/23/2009  . RASH 12/23/2010  . 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 SYNCOPE 01/02/2009  . THYROID FUNCTION TEST, ABNORMAL 03/19/2009  . TOBACCO USE 01/25/2008  . WEIGHT LOSS-ABNORMAL 12/05/2010    Past Surgical History:  Procedure Laterality Date  . ESOPHAGOGASTRODUODENOSCOPY (EGD) WITH PROPOFOL N/A 02/28/2016   Procedure: ESOPHAGOGASTRODUODENOSCOPY (EGD) WITH PROPOFOL;  Surgeon: Napoleon Form, MD;  Location: [facility];  Service: Endoscopy;  Laterality: N/A;  . KNEE SURGERY Left 1998   Arthroscopic--for torn meniscus    Family History  Problem Relation Age of Onset  . Hypertension Mother   . Kidney disease Mother        removed for cyst--not clear if this was a malignancy or not  . Other Mother        overweight--lost a lot following diagnosis of diabetes  . Diabetes Mother   . Heart attack Father   . Stroke Father        died age 45 yo--had multiple TIAs  . Muscular dystrophy Father   . Hypertension Brother   . Neuromuscular disorder Other        spinomuscular atrophy    Social History  Socioeconomic History  . Marital status: Single    Spouse name: Not on file  . Number of children: 0  . Years of education: GED  . Highest education level: Not on file  Occupational History  . Occupation: Engineer, manufacturingcashier/stocker/floor associate    Employer: [employer]  Social Needs  . Financial resource strain: Not on file  . Food insecurity:    Worry: Not on file    Inability: Not on file  . Transportation needs:    Medical: Not on file    Non-medical: Not on file  Tobacco Use  . Smoking status: Current Every Day Smoker    Packs/day: 0.50    Years: 36.00    Pack years: 18.00    Types: Cigarettes  . Smokeless tobacco: Never Used  . Tobacco comment: Not ready to quit.  Does not want  patches or other medication support.  Not a "medication"  person.    Substance and Sexual Activity  . Alcohol use: Not Currently    Comment: States none since her first stenting spring of 2019.  . Drug use: No  . Sexual activity: Not on file  Lifestyle  . Physical activity:    Days per week: Not on file    Minutes per session: Not on file  . Stress: Not on file  Relationships  . Social connections:    Talks on phone: Not on file    Gets together: Not on file    Attends religious service: Not on file    Active member of club or organization: Not on file    Attends meetings of clubs or organizations: Not on file    Relationship status: Not on file  . Intimate partner violence:    Fear of current or ex partner: Not on file    Emotionally abused: Not on file    Physically abused: Not on file    Forced sexual activity: Not on file  Other Topics Concern  . Not on file  Social History Narrative   Has lived in [place] for 15 years, in [place] years.   Originally from [place]              Review of Systems  Constitutional: Negative for appetite change, fatigue and unexpected weight change.  HENT: Negative for dental problem (States does not have $30 to be seen by dental.), ear pain, hearing loss, sinus pain and sore throat.   Eyes: Negative for visual disturbance (Bifocals through [employer]).  Respiratory: Negative for cough and shortness of breath.   Cardiovascular: Positive for palpitations (Has not had any in 1-2 months.  Does take a very small sprinkling of Metoprolol when she does.  ). Negative for chest pain (Only when her pancreas is acting up.).  Gastrointestinal: Positive for abdominal pain (Chronic with chronic pancreatitis.). Negative for blood in stool (No melena), constipation, diarrhea and nausea.  Genitourinary: Negative for dysuria, vaginal bleeding (No periods since age 58 yo) and vaginal discharge.  Musculoskeletal: Positive for arthralgias (Knees  sometimes.  Generally going up stairs or hills.  No swelling or erythema.  ).  Skin: Negative for rash.  Neurological: Negative for numbness.       Left sided numbness to toe.  Has had chronically.  Tried to work up in past, but did not have orange card current.  Psychiatric/Behavioral: Positive for dysphoric mood (Still with active counseling with Margaretmary Lombardorissa Parker [facility]). Negative for suicidal ideas.  Objective:   Blood pressure 120/72, pulse 88, resp. rate 12, height 4\' 11"  (1.499 m), weight 87 lb (39.5 kg).   Physical Exam  Constitutional: She is oriented to person, place, and time. She appears cachectic.  HENT:  Head: Normocephalic and atraumatic.  Right Ear: Hearing, tympanic membrane, external ear and ear canal normal.  Left Ear: Hearing, tympanic membrane, external ear and ear canal normal.  Nose: Nose normal.  Mouth/Throat: Uvula is midline, oropharynx is clear and moist and mucous membranes are normal. Dental caries present.  Eyes: Pupils are equal, round, and reactive to light. Conjunctivae and EOM are normal.  Discs sharp bilaterally  Neck: Normal range of motion and full passive range of motion without pain. Neck supple. No thyroid mass and no thyromegaly present.  Cardiovascular: Normal rate, regular rhythm, S1 normal and S2 normal. Exam reveals no S3, no S4 and no friction rub.  No murmur heard. No carotid bruits.  Carotid, radial, femoral, DP and PT pulses normal and equal.   Pulmonary/Chest: Effort normal and breath sounds normal. Right breast exhibits no inverted nipple, no mass, no nipple discharge, no skin change and no tenderness. Left breast exhibits no inverted nipple, no mass, no nipple discharge, no skin change and no tenderness.  Abdominal: Soft. Bowel sounds are normal. She exhibits no mass. There is no hepatosplenomegaly. There is abdominal tenderness (Diffuse epigastric tenderness.  No rebound or peritoneal signs.  A bit of firmness  in mid epigastrium that is chronic.).  Genitourinary:    Genitourinary Comments: Normal external female genitalia.  No vaginal discharge.  No cervical lesion. No uterine or adnexal mass or tenderness. Rectal:  No mass, heme negative light brown stool.   Musculoskeletal: Normal range of motion.  Lymphadenopathy:       Head (right side): No submental and no submandibular adenopathy present.       Head (left side): No submental and no submandibular adenopathy present.    She has no cervical adenopathy.    She has no axillary adenopathy.       Right: No inguinal and no supraclavicular adenopathy present.       Left: No inguinal and no supraclavicular adenopathy present.  Neurological: She is alert and oriented to person, place, and time. She has normal strength and normal reflexes. No cranial nerve deficit or sensory deficit. Coordination and gait normal.  Skin: Skin is warm and dry. No rash noted.  Psychiatric: Her speech is normal. Her affect is angry and labile. Cognition and memory are normal.     Assessment & Plan   1.  CPE with pap:   Guaiac cards x 3 to return in 2 weeks. Fasting labs then. Encouraged her to rethink immunizations (flu and Tdap.) Mammogram to be scheduled.  2.  Tobacco Use:  Not interested in quitting.  3. Alcohol abuse with chronic recurrent pancreatitis:  Not clear if she still intermittently is using alcohol

## 2019-01-18 LAB — CYTOLOGY - PAP

## 2019-01-27 ENCOUNTER — Other Ambulatory Visit: Payer: Medicaid Other

## 2019-02-14 ENCOUNTER — Ambulatory Visit: Payer: Medicaid Other | Admitting: Internal Medicine

## 2019-02-14 ENCOUNTER — Other Ambulatory Visit: Payer: Self-pay

## 2019-02-14 ENCOUNTER — Encounter: Payer: Self-pay | Admitting: Internal Medicine

## 2019-02-14 VITALS — BP 130/90 | HR 80 | Resp 12 | Ht 59.0 in | Wt 87.0 lb

## 2019-02-14 DIAGNOSIS — Z599 Problem related to housing and economic circumstances, unspecified: Secondary | ICD-10-CM

## 2019-02-14 DIAGNOSIS — R29818 Other symptoms and signs involving the nervous system: Secondary | ICD-10-CM

## 2019-02-14 DIAGNOSIS — R1013 Epigastric pain: Secondary | ICD-10-CM

## 2019-02-14 DIAGNOSIS — F1020 Alcohol dependence, uncomplicated: Secondary | ICD-10-CM

## 2019-02-14 MED ORDER — TRAMADOL HCL 50 MG PO TABS: 50.0000 mg | ORAL_TABLET | Freq: Four times a day (QID) | ORAL | 0 refills | Status: DC | PRN

## 2019-02-14 NOTE — Progress Notes (Signed)
Subjective:    Patient ID: Nichole [name], female   DOB: 11/02/1961, 58 y.o.   MRN: [id number]   HPI   1.  Housing:  Has a Section 8 Voucher, but cannot find anyone to take it.  Mother is willing to help with utilities.  Has a IT trainer.  She was told [employer] who set her up with this needs to help her.  She states they are actively trying to help her.   She states she is having difficulty getting up steps and that's the apartment they found for her. She states she has a list of places, but now running into problems with finding out whether there is actually availability in housing she is looking at.   She is currently staying at an apt in [place] that [employer] is helping her out with.  She needs to be out end of March. Georgette, our MSW intern was working with her She did not have her voice message set up, so our intern did not leave a message initially.    Regarding disability:  Applied and only given Family planning Medicaid through the process.  She is working on emergency Medicaid.  She is also working on specifically disability SSI, etc.  She was denied and is beginning the appeal process.    Chronic pancreatitis with frequent acute episodes and recurrent pseudocyst formation.   She states she cannot afford clinic visits through [facility] or if has endoscopy, which cost $10 and $75 respectively.   She was to go back to have endoscopically placed gastric-pseudocyst stent removed as felt to have decompressed the pseudocyst.  Again, this would be $75 for the procedure.  Alcoholic pancreatitis:  Has not had anything to drink since February when stressed with her housing situation.  She thinks she has not had anything to drink since the 17th of February. She continues to counsel with Ace Gins at [facility] [place].  She states she does not have a problem with drinking alcohol.  "I can quit when I want"  She does not believe she needs help.   She admits her manager at work has offered to help her with her housing or transportation and she has not asked her to help.  Pancreatitis:  5 days ago, developed increased pain in mid epigastric area and radiates to back.  Can eat 4-5 bites before the pain starts.  Has to wait about 20 minutes and then able to complete meal without recurrence.   She ate a very large breakfast yesterday without pain. Worse when she lies back. Having regular formed stool without melena or hematochezia. No fever She also continues to have an area in her epigastrium that feels hard and is tender.  Seems to move around. Currently in LUQ above and lateral to umbilicus.  Has had CT scans since this first noted that did not definitively show what this is.  Was told it is not the stent.     Left sided numbness:  Still has not been able to obtain MRI of brain. Her orange card expired last time before she could get scheduled.   The numbness now involving her lateral 4 toes on left.    Current Meds  Medication Sig  . metoprolol tartrate (LOPRESSOR) 25 MG tablet Take 0.5 tablets (12.5 mg total) by mouth daily as needed (rapid heart rate/ high blood pressure). 1/4 tablet as needed (Patient taking differently: Take 6.25 mg by mouth daily as  needed (rapid heart rate/ high blood pressure (1/4 tablet)). )  . Pancrelipase, Lip-Prot-Amyl, (CREON) 24000-76000 units CPEP 1 capsule with meals, 1 capsule with snacks (up to 2 snacks daily) as directed   Allergies  Allergen Reactions  . Lisinopril Swelling     facial angioedema     Review of Systems    Objective:   BP 130/90 (BP Location: Left Arm, Patient Position: Sitting, Cuff Size: Normal)   Pulse 80   Resp 12   Ht 4\' 11"  (1.499 m)   Wt 87 lb (39.5 kg)   LMP  (LMP Unknown)   BMI 17.57 kg/m   Physical Exam  NAD Lungs:  CTA. Good air movement CV: RRR without murmur or rub,  Radial and DP pulses normal and equal Abd:  Very painful for her to lie back for exam.   Palpable mass in LUQ that decreases in size as gas passes through intestine in this area.  Moderately tender.  + BS, no high pitched noises.  No rebound or peritoneal signs.  No HSM appreciated.   Neuro:  Light touch decreased left face, arm and leg. Assessment & Plan   1.  Housing:  Will check with Daphene Calamity as to whether she has had any success contacting patient. With current corona virus crisis, not clear anything will be done quickly.  2.  Acute on Chronic Pancreatitis:  Went over typical course of pancreatitis with patient again. Discussed her symptoms suggest she has had another flare of acute inflammation. She is to only eat 3 bites with her pancreatic enzymes at each meal, wait for 10-15 minutes and continue eating at that rate instead of just eating until she hurts.  Discussed may take her a couple of hours to eat a meal. Discussed I feel she is denial regarding her alcohol intake and encourage her to think about ADS.   She does not feel she currently has a problem as she was able to give up alcohol in mid February after a time of drinking again due to her stressors.  3.  Disability:  Encouraged her to appeal her denial of disability.  Hopefully, she will be successful and obtain medical coverage thereafter as well so can get her back to transplant and GI where she can afford the care.

## 2019-02-15 LAB — COMPREHENSIVE METABOLIC PANEL
A/G RATIO: 0.8 — AB (ref 1.2–2.2)
ALBUMIN: 3.9 g/dL (ref 3.8–4.9)
ALT: 8 IU/L (ref 0–32)
AST: 18 IU/L (ref 0–40)
Alkaline Phosphatase: 119 IU/L — ABNORMAL HIGH (ref 39–117)
BUN/Creatinine Ratio: 17 (ref 9–23)
BUN: 12 mg/dL (ref 6–24)
Bilirubin Total: 0.2 mg/dL (ref 0.0–1.2)
CALCIUM: 9.6 mg/dL (ref 8.7–10.2)
CO2: 24 mmol/L (ref 20–29)
CREATININE: 0.69 mg/dL (ref 0.57–1.00)
Chloride: 100 mmol/L (ref 96–106)
GFR, EST AFRICAN AMERICAN: 113 mL/min/{1.73_m2} (ref >59)
GFR, EST NON AFRICAN AMERICAN: 98 mL/min/{1.73_m2} (ref >59)
GLOBULIN, TOTAL: 4.7 g/dL — AB (ref 1.5–4.5)
Glucose: 160 mg/dL — ABNORMAL HIGH (ref 65–99)
POTASSIUM: 4.6 mmol/L (ref 3.5–5.2)
SODIUM: 141 mmol/L (ref 134–144)
TOTAL PROTEIN: 8.6 g/dL — AB (ref 6.0–8.5)

## 2019-02-15 LAB — CBC WITH DIFFERENTIAL/PLATELET
BASOS: 1 %
Basophils Absolute: 0 10*3/uL (ref 0.0–0.2)
EOS (ABSOLUTE): 0.1 10*3/uL (ref 0.0–0.4)
Eos: 1 %
HEMOGLOBIN: 13.1 g/dL (ref 11.1–15.9)
Hematocrit: 39.4 % (ref 34.0–46.6)
IMMATURE GRANS (ABS): 0 10*3/uL (ref 0.0–0.1)
IMMATURE GRANULOCYTES: 0 %
LYMPHS: 12 %
Lymphocytes Absolute: 0.8 10*3/uL (ref 0.7–3.1)
MCH: 34.9 pg — ABNORMAL HIGH (ref 26.6–33.0)
MCHC: 33.2 g/dL (ref 31.5–35.7)
MCV: 105 fL — AB (ref 79–97)
MONOS ABS: 0.4 10*3/uL (ref 0.1–0.9)
Monocytes: 6 %
NEUTROS PCT: 80 %
Neutrophils Absolute: 5.6 10*3/uL (ref 1.4–7.0)
Platelets: 258 10*3/uL (ref 150–450)
RBC: 3.75 x10E6/uL — ABNORMAL LOW (ref 3.77–5.28)
RDW: 13.8 % (ref 11.7–15.4)
WBC: 7 10*3/uL (ref 3.4–10.8)

## 2019-02-22 LAB — FOLATE: FOLATE: 5.3 ng/mL (ref >3.0)

## 2019-02-22 LAB — SPECIMEN STATUS REPORT

## 2019-02-22 LAB — VITAMIN B12: VITAMIN B 12: 607 pg/mL (ref 232–1245)

## 2019-02-22 LAB — HGB A1C W/O EAG: Hgb A1c MFr Bld: 5.3 % (ref 4.8–5.6)

## 2019-02-22 LAB — TSH: TSH: 0.66 u[IU]/mL (ref 0.450–4.500)

## 2019-05-17 ENCOUNTER — Encounter: Payer: Self-pay | Admitting: Internal Medicine

## 2019-05-17 ENCOUNTER — Ambulatory Visit: Payer: Medicaid Other | Admitting: Internal Medicine

## 2019-05-17 ENCOUNTER — Other Ambulatory Visit: Payer: Self-pay

## 2019-05-17 VITALS — BP 140/90 | HR 88 | Resp 12 | Ht 59.0 in | Wt 91.0 lb

## 2019-05-17 DIAGNOSIS — D7589 Other specified diseases of blood and blood-forming organs: Secondary | ICD-10-CM

## 2019-05-17 DIAGNOSIS — Z59819 Housing instability, housed unspecified: Secondary | ICD-10-CM

## 2019-05-17 DIAGNOSIS — R29818 Other symptoms and signs involving the nervous system: Secondary | ICD-10-CM

## 2019-05-17 DIAGNOSIS — Z716 Tobacco abuse counseling: Secondary | ICD-10-CM

## 2019-05-17 DIAGNOSIS — K860 Alcohol-induced chronic pancreatitis: Secondary | ICD-10-CM

## 2019-05-17 DIAGNOSIS — Z599 Problem related to housing and economic circumstances, unspecified: Secondary | ICD-10-CM

## 2019-05-17 DIAGNOSIS — F17200 Nicotine dependence, unspecified, uncomplicated: Secondary | ICD-10-CM

## 2019-05-17 NOTE — Progress Notes (Signed)
    Subjective:    Patient ID: Nichole [name], female   DOB: 01-27-1961, 58 y.o.   MRN: [id number]   HPI   1.  Pancreatitis:  She is taking pancreatic enzymes twice daily and eating small meals. Has put on about 3 lbs.   Does drink Boost at times, but describes eating a roast beef sandwich yesterday.   Some vegetables and fruit daily. Her discomfort is still there, but tolerable.  2.  Housing:  Does have a Presenter, broadcasting, states not Section 8.    3.  Left sided numbness:  Dates back to July of 2019--see 07/06/2018 note when patient brings this up at end of visit.  Has stayed about the same.  Involves face, arm, leg. MR of brain ordered at 07/06/2018 visit, but patient would not stay current with orange card. Finally able to get MR of brain at [facility] as orange card was current 05/05/2019 Abnormal symmetric T2/flair hyperintensity in bilateral periventricular white matter and subcortical areas as well as in bilateral middle cerebellar peduncles.  2009 MR of brain ?with hospitalization for alcohol withdrawal seizure?:   moderate chronic microvascular ischemic change in periventricular and subcortical white matter as well as right thalamic lacunar infarct.      Current Meds  Medication Sig  . Pancrelipase, Lip-Prot-Amyl, (CREON) 24000-76000 units CPEP 1 capsule with meals, 1 capsule with snacks (up to 2 snacks daily) as directed  . traMADol (ULTRAM) 50 MG tablet Take 1 tablet (50 mg total) by mouth every 6 (six) hours as needed.   Allergies  Allergen Reactions  . Lisinopril Swelling     facial angioedema     Review of Systems    Objective:   BP 140/90 (BP Location: Left Arm, Patient Position: Sitting, Cuff Size: Normal)   Pulse 88   Resp 12   Ht 4\' 11"  (1.499 m)   Wt 91 lb (41.3 kg)   LMP  (LMP Unknown)   BMI 18.38 kg/m   Physical Exam  NAD HEENT:  PERRL, EOMI Chest:  CTA CV:  RRR without murmur or rub.  Radial and DP pulses normal and equal Abd:  Diffusely  mildly tender, particularly in epigastric area.  No HDM or mass, + BS LE:  No edema. Neuro:  A & O x 3, CN  II-XII grossly intact.  DTRs 2+/4 Motor 5/5.  Sensory to light touch decreased throughout left face, lateral arm and lateral leg.  Romberg negative.  Finger to nose to finger and rapid alternating movements normal. Gait normal   Assessment & Plan  1.  Pancreatitis:  To continue with smaller,more frequent meals.  Discussed avoiding the really heavy meals and taking pancreatic enzymes with each to see if helps with the pain  2.  Housing:  Appears this issue is solved now.  3.  Left sided numbness with possibly some new findings on MR.  Call into [facility] to discuss the changes, particularly with cerebellar peduncles.  No obvious cerebellar findings on exam today. Not clear if a progressive process in cerebellar peduncles due to same issue (chronic microvascular ischemia) or new.  Not clear also if this is even related to her symptoms. Suspect right thalamus infarct noted 2009 may be more related to symptoms Suspect still using alcohol intermittently and continues to smoke.  Not interested in cessation with tobacco still.

## 2019-06-14 ENCOUNTER — Telehealth: Payer: Self-pay | Admitting: Internal Medicine

## 2019-06-14 NOTE — Telephone Encounter (Signed)
Patient called stating in her last ov were discussing last MRI results patient had done back in June with Dr. Amil Amen. Patient states was told by Dr. Amil Amen needed to do a deeper review on results and will call he back with more information. Patient states still waiting for the call.  Please advise.

## 2019-06-14 NOTE — Telephone Encounter (Signed)
To Dr. Mulberry for further directions 

## 2019-07-06 ENCOUNTER — Telehealth: Payer: Self-pay

## 2019-07-06 NOTE — Telephone Encounter (Signed)
Entered in error

## 2019-08-19 ENCOUNTER — Encounter: Payer: Self-pay | Admitting: Internal Medicine

## 2019-08-19 ENCOUNTER — Other Ambulatory Visit: Payer: Self-pay

## 2019-08-19 ENCOUNTER — Ambulatory Visit (INDEPENDENT_AMBULATORY_CARE_PROVIDER_SITE_OTHER): Payer: Medicaid Other | Admitting: Internal Medicine

## 2019-08-19 VITALS — BP 138/78 | HR 80 | Resp 12 | Ht 59.0 in | Wt 94.0 lb

## 2019-08-19 DIAGNOSIS — R29818 Other symptoms and signs involving the nervous system: Secondary | ICD-10-CM

## 2019-08-19 DIAGNOSIS — Z716 Tobacco abuse counseling: Secondary | ICD-10-CM

## 2019-08-19 DIAGNOSIS — K860 Alcohol-induced chronic pancreatitis: Secondary | ICD-10-CM | POA: Diagnosis not present

## 2019-08-19 DIAGNOSIS — R9089 Other abnormal findings on diagnostic imaging of central nervous system: Secondary | ICD-10-CM

## 2019-08-19 DIAGNOSIS — F17200 Nicotine dependence, unspecified, uncomplicated: Secondary | ICD-10-CM | POA: Diagnosis not present

## 2019-08-19 NOTE — Progress Notes (Signed)
Subjective:    Patient ID: Nichole [name], female   DOB: Jul 28, 1961, 58 y.o.   MRN: [id number]   HPI   1.  Chronic recurrent pancreatitis:  Pain is better during day, but more pain when lying on left side.   Feels the pancreatic enzymes are helping.  Is up another 3 lbs.   Had some significant pain about 2 weeks ago, for which she took ibuprofen.   Tylenol 500 mg helped as well.  Pain was relieved until that evening. She also took a Tramadol, which worked quite well also. Apparently, Transplant clinic is considering having part of pancreas removed  2.  Tobacco Abuse:  Not clear she is interested.  She states [facility] is trying to get hold of her for a program.  She is thinking about doing this  3.  Left sided numbness:  MR of brain from June with some indefinite findings--unable to get hold of radiologist when called with her follow up then.  MR of brain in 2009 with  Has had some falls when turns--felt like the left leg just gave out and fell.  Occurred about 1 month ago and then again 2 weeks ago--same scenario.    Current Meds  Medication Sig  . dexlansoprazole (DEXILANT) 60 MG capsule Take 1 capsule (60 mg total) by mouth daily.  . metoprolol tartrate (LOPRESSOR) 25 MG tablet Take 0.5 tablets (12.5 mg total) by mouth daily as needed (rapid heart rate/ high blood pressure). 1/4 tablet as needed  . Pancrelipase, Lip-Prot-Amyl, (CREON) 24000-76000 units CPEP 1 capsule with meals, 1 capsule with snacks (up to 2 snacks daily) as directed   Allergies  Allergen Reactions  . Lisinopril Swelling     facial angioedema     Review of Systems    Objective:   BP 138/78 (BP Location: Left Arm, Patient Position: Sitting, Cuff Size: Normal)   Pulse 80   Resp 12   Ht 4\' 11"  (1.499 m)   Wt 94 lb (42.6 kg)   LMP  (LMP Unknown)   BMI 18.99 kg/m   Physical Exam  NAD Cachexia less apparent Lungs:  CTA CV:  RRR without murmur or rub.  Radial and DP pulses normal and equal Abd:  S,  mild diffuse tenderness.  No HSM or mass, + BS LE:  No edema. Neuro:  A & O x 3, CN  II-XII grossly intact.  Motor 5/5.  UE DTRs 2+/4.  Unable to obtain DTRs in LE. Sensory to light touch decreased on left lateral face, torso, lateral arm and lateral leg.  No loss on medial aspects of left leg.   Romberg negative.  Rapid alternating motion normal.  Finger to nose to finger normal.  Gait normal.   Assessment & Plan   1.  Chronic Recurrent Pancreatitis:  She will call Transplant at [facility] to get more information regarding possible surgery.  States she does not understand her options well enough yet. Seems to generally be doing better.  2.  Tobacco abuse:  Unable to pin her down on doing something.  3.  Chronic left sided numbness with what appears to be new findings compared to 2009 MR in bilateral middle cerebellar peduncles and in bilateral thalami and pons.  She did have right thalamic lacunar infarct in 2009 and moderate chronic microvascular ischemic change in periventricular and subcortical white matter then as well. Recheck MR of brain to see if progressive and see if [facility] can compare to that at [facility]--patient  now with Medicaid to cover this. Consider Neuro referral if is showing progression in this 3 month window Spoke with reading Radiologist at [facility], who could not say if new changes commonly seen with chronic alcohol and tobacco abuse. Referral to Neurology as well now she has Medicaid Called patient on 08/19/2019, but only able to leave message regarding the above.   Phone system in clinic was down at the time.

## 2019-08-26 ENCOUNTER — Telehealth: Payer: Self-pay | Admitting: Internal Medicine

## 2019-08-26 DIAGNOSIS — R29818 Other symptoms and signs involving the nervous system: Secondary | ICD-10-CM

## 2019-08-26 NOTE — Telephone Encounter (Signed)
Pt. Called returning Dr. Melissa Noon phone call from Friday in reference to seeing the neurologist and repeat MRI. Patient informed that message will be sent to Dr. Amil Amen.

## 2019-09-01 NOTE — Telephone Encounter (Signed)
Spoke with [facility]. Need new order before being able to schedule and needs prio auth. If you can put in order I can do prior auth first in the am.

## 2019-09-01 NOTE — Telephone Encounter (Signed)
I have ordered the MRI--needs to get scheduled.  See if MR needs me to reorder as somehow, I ordered MR angio and just wanted regular without contrast to compare to the one at [facility]. They really just need to get the study done as they wait for the [facility] hard copy.

## 2019-09-08 NOTE — Telephone Encounter (Signed)
Patient is scheduled for MRI of brain for 09/23/2019. Patient prior Nichole [name] is in clinical review. Case # [id number]. [facility]omred would get a response back within 24-48 hours of approval or denial.

## 2019-09-23 ENCOUNTER — Other Ambulatory Visit: Payer: Medicaid Other

## 2019-09-23 ENCOUNTER — Other Ambulatory Visit: Payer: Self-pay

## 2019-09-23 ENCOUNTER — Ambulatory Visit
Admission: RE | Admit: 2019-09-23 | Discharge: 2019-09-23 | Disposition: A | Discharge status: Home / Self Care | Payer: Medicaid Other | Source: Ambulatory Visit | Attending: Internal Medicine | Admitting: Internal Medicine

## 2019-09-23 DIAGNOSIS — R29818 Other symptoms and signs involving the nervous system: Secondary | ICD-10-CM

## 2019-10-12 ENCOUNTER — Encounter: Payer: Self-pay | Admitting: Neurology

## 2019-10-12 ENCOUNTER — Ambulatory Visit: Payer: Medicaid Other | Admitting: Neurology

## 2019-10-12 ENCOUNTER — Other Ambulatory Visit: Payer: Self-pay

## 2019-10-12 VITALS — BP 150/91 | HR 111 | Temp 97.4°F | Ht 59.0 in | Wt 96.0 lb

## 2019-10-12 DIAGNOSIS — G312 Degeneration of nervous system due to alcohol: Secondary | ICD-10-CM | POA: Diagnosis not present

## 2019-10-12 DIAGNOSIS — F1020 Alcohol dependence, uncomplicated: Secondary | ICD-10-CM | POA: Diagnosis not present

## 2019-10-12 MED ORDER — VITAMIN B-1 100 MG PO TABS: 100.0000 mg | ORAL_TABLET | Freq: Every day | ORAL | 1 refills | Status: DC

## 2019-10-12 NOTE — Patient Instructions (Signed)
Ataxia  Ataxia is a condition that causes unsteadiness when walking and standing, poor coordination of body movements, and difficulty keeping a straight (upright) posture. It occurs because of a problem with the part of the brain that controls coordination and stability (cerebellum). Ataxia can develop later in life (acquired ataxia), during your 20s or 30s or even into your 60s or later. This type of ataxia develops when another medical condition, such as a stroke, damages the cerebellum. Ataxia also may be present early in life (non-acquired ataxia). There are two main types of non-acquired ataxia:  Congenital. This type is present at birth.  Hereditary. This type is passed from parent to child. The most common form of hereditary non-acquired ataxia is Friedreich ataxia. What are the causes? Acquired ataxia may be caused by:  Changes in the nervous system (neurodegenerative changes).  Changes throughout the body (systemic disorders).  A lot of exposure to: ? Certain medicines such as phenytoin and lithium. ? Solvents. These are cleaning fluids such as paint thinner, nail polish remover, carpet cleaner, and degreasers.  Alcohol abuse (alcoholism).  Medical conditions, such as: ? Celiac disease. ? Hypothyroidism. ? A lack (deficiency) of vitamin E, vitamin B12, or thiamine. ? Brain tumors. ? Multiple sclerosis. ? Cerebral palsy. ? Stroke. ? Paraneoplastic syndromes. ? Viral infections. ? Head injury. ? Malnutrition. Congenital and hereditary ataxia are caused by problems that are present in genes before birth. What are the signs or symptoms? Signs and symptoms of ataxia vary depending on the cause. They may include:  Being unsteady.  Walking with the legs wide apart (wide stance) to keep one's balance.  Uncontrolled shaking (tremor).  Poorly coordinated body movements.  Difficulty maintaining an upright posture.  Fatigue.  Changes in speech.  Changes in vision.   Involuntary eye movements (nystagmus).  Difficulty swallowing.  Difficulty writing.  Muscle tightening that you cannot control (muscle spasms). How is this diagnosed? Ataxia may be diagnosed based on:  Your personal and family medical history.  A physical exam.  Imaging tests, such as a CT scan or MRI.  Spinal tap (lumbar puncture). This procedure involves using a needle to take a sample of the fluid around your brain and spinal cord.  Genetic testing. How is this treated? The underlying condition that causes your ataxia needs to be treated. If the cause is a brain tumor, you may need surgery. Treatment also focuses on helping you live with ataxia and improving your quality of life (supportive treatments). This may involve:  Learning ways to improve coordination and move around more carefully (physical therapy).  Learning ways to improve your ability to do daily tasks, such as bathing and feeding yourself (occupational therapy).  Using devices to help you move around, eat, or communicate (assistive devices), such as a walker, modified eating utensils, and communication aids.  Learning ways to improve speech and swallowing (speech therapy). Follow these instructions at home: Preventing falls  Lie down right away if you become very unsteady, dizzy, or nauseous, or if you feel like you are going to faint. Do not get up until all of those feelings pass.  Keep your home well-lit. Use night-lights as needed.  Remove tripping hazards, such as rugs, cords, and clutter.  Install grab bars by the toilet and in the tub and shower.  Use assistive devices such as a cane, walker, or wheelchair as needed to keep your balance. General instructions  Do not drink alcohol.  Ask your health care provider what activities are safe   for you, and what activities you should avoid.  Take over-the-counter and prescription medicines only as told by your health care provider. Get help right away  if you:  Have unsteadiness that suddenly worsens.  Have any of these: ? Severe headaches. ? Chest pain. ? Abdominal pain. ? Weakness or numbness on one side of your body. ? Vision problems. ? Difficulty speaking. ? An irregular heartbeat. ? A very fast pulse.  Feel confused. Summary  Ataxia is a condition that causes unsteadiness when walking and standing, poor coordination of body movements, and difficulty keeping a straight (upright) posture.  Ataxia occurs because of a problem with the part of the brain that controls coordination and stability (cerebellum).  The underlying condition that causes your ataxia needs to be treated. Treatment also focuses on helping you live with ataxia and improving your quality of life (supportive treatments).  Lie down right away if you become very unsteady, dizzy, or nauseous, or if you feel like you are going to faint. This information is not intended to replace advice given to you by your health care provider. Make sure you discuss any questions you have with your health care provider. Document Released: 06/14/2014 Document Revised: 10/30/2017 Document Reviewed: 09/18/2017 Elsevier Patient Education  2020 Else[employer] Inc.  

## 2019-10-12 NOTE — Progress Notes (Signed)
Chief Complaint  Patient presents with  . New Patient (Initial Visit)    pt states started in 2019 she developed numbness on the left side of her tongue. in early 2020 she had it occur on left nose, left side of face. it resolved on its own. Then when it came back it presented down the whole left side of her body and has not went away. In the last month she has noticed more weakness presenting in the left leg and she has had 3 falls       Provider:  Melvyn Novasarmen  Legend Tumminello, M D  Referring Provider: Julieanne MansonMulberry, Elizabeth, MD Primary Care Physician:  Julieanne MansonMulberry, Elizabeth, MD  Chief Complaint  Patient presents with  . New Patient (Initial Visit)    pt states started in 2019 she developed numbness on the left side of her tongue. in early 2020 she had it occur on left nose, left side of face. it resolved on its own. Then when it came back it presented down the whole left side of her body and has not went away. In the last month she has noticed more weakness presenting in the left leg and she has had 3 falls    HPI:  Nichole [name] is a 58 y.o. female , seen here upon a referral from Dr. Delrae AlfredMulberry for slowly progressive weakness, imbalance . left sided progressive numbness, and falls.   Nichole [name] is a right-handed African-American middle-aged female with a progressive gait disorder, balance problems, numbness affecting the left side of her body beginning at the face.  Her primary care physician is Julieanne Mansonlizabeth Mulberry who already ordered an MRI it was performed without contrast but could not be compared to a much older study from [facility] and then to a repeat [facility] study from 4 June of this year. The study shows progressive and quite advanced white matter disease remote strokes in the pons confluent white matter lesions that are not demyelinating in origin affecting her basal ganglia and subcortical structures.  Special notification was given to the pons and to the cerebellar atrophy.  She was  [facility] evaluation for possible pancreatic transplantation.  She has had repeated pancreatitis bouts in the past.  She had upper GI bleeds in the past, she continues to smoke but she was able to quit drinking.  However her neuropathy, her sensory symptoms and her balance impairment can clearly be related to the atrophy of several brain areas, and I am not surprised that this has continued as long as she was drinking.  She is now sober but she does have to also stop smoking to protect her small vessels.  I will strongly recommend that she takes a vitamin B complex multimineral complex and drinks enough water to help stabilizing blood pressure and brain perfusion.  Pancreatic enzymes are helping , she is supposed to gain weight. These help with lipase, amylase and proteinases. She needs to take them before a meal and not after- with water.    Review of Systems: Out of a complete 14 system review, the patient complains of only the following symptoms, and all other reviewed systems are negative.   Social History   Socioeconomic History  . Marital status: Single    Spouse name: Not on file  . Number of children: 0  . Years of education: GED  . Highest education level: Not on file  Occupational History  . Occupation: Engineer, manufacturingcashier/stocker/floor associate    Employer: [employer]  Social Needs  . Financial resource strain: Not on file  . Food insecurity    Worry: Not on file    Inability: Not on file  . Transportation needs    Medical: Not on file    Non-medical: Not on file  Tobacco Use  . Smoking status: Current Every Day Smoker    Packs/day: 0.50    Years: 36.00    Pack years: 18.00    Types: Cigarettes  . Smokeless tobacco: Never Used  . Tobacco comment: Not ready to quit.  Does not want patches or other medication support.  Not a "medication"  person.    Substance and Sexual Activity  . Alcohol use: Not Currently    Comment: States none since her first  stenting spring of 2019.  . Drug use: No  . Sexual activity: Not on file  Lifestyle  . Physical activity    Days per week: Not on file    Minutes per session: Not on file  . Stress: Not on file  Relationships  . Social Musician on phone: Not on file    Gets together: Not on file    Attends religious service: Not on file    Active member of club or organization: Not on file    Attends meetings of clubs or organizations: Not on file    Relationship status: Not on file  . Intimate partner violence    Fear of current or ex partner: Not on file    Emotionally abused: Not on file    Physically abused: Not on file    Forced sexual activity: Not on file  Other Topics Concern  . Not on file  Social History Narrative   Has lived in [place] for 15 years, in [place] for 30 plus years.   Originally from [place]             Family History  Problem Relation Age of Onset  . Hypertension Mother   . Kidney disease Mother        removed for cyst--not clear if this was a malignancy or not  . Other Mother        overweight--lost a lot following diagnosis of diabetes  . Diabetes Mother   . Heart attack Father   . Stroke Father        died age 76 yo--had multiple TIAs  . Muscular dystrophy Father   . Hypertension Brother   . Neuromuscular disorder Other        spinomuscular atrophy    Past Medical History:  Diagnosis Date  . Acute pancreatitis 12/05/2010, 01/2016   with complex pseudocyst, attributed to ETOH.  2017: pseudocyst, acute on chronic pancreatitis. pancreatic calcifications.  . ANEMIA, B12 DEFICIENCY 04/05/2009   macrocytic  . EXCESSIVE MENSTRUAL BLEEDING 03/18/2010   uterine fibroids. gyn eval and bx neg  . Hematemesis 01/2016   in setting pancreatitispseudocyst.  . HYPERTENSION 01/29/2008  . LEG PAIN, BILATERAL 06/22/2008  . Palpitations 01/02/2009   States heart rate has been up to 180, though states has never been seen when having fast heart rate.  Marland Kitchen  PERIMENOPAUSAL SYNDROME 08/23/2009  . RASH 12/23/2010  . 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 SYNCOPE 01/02/2009  . THYROID FUNCTION TEST, ABNORMAL 03/19/2009  . TOBACCO USE 01/25/2008  . WEIGHT LOSS-ABNORMAL 12/05/2010    Past Surgical History:  Procedure Laterality Date  . ESOPHAGOGASTRODUODENOSCOPY (EGD) WITH PROPOFOL N/A 02/28/2016   Procedure:  ESOPHAGOGASTRODUODENOSCOPY (EGD) WITH PROPOFOL;  Surgeon: Napoleon Form, MD;  Location: [facility];  Service: Endoscopy;  Laterality: N/A;  . KNEE SURGERY Left 1998   Arthroscopic--for torn meniscus    Current Outpatient Medications  Medication Sig Dispense Refill  . dexlansoprazole (DEXILANT) 60 MG capsule Take 1 capsule (60 mg total) by mouth daily. 30 capsule 11  . metoprolol tartrate (LOPRESSOR) 25 MG tablet Take 0.5 tablets (12.5 mg total) by mouth daily as needed (rapid heart rate/ high blood pressure). 1/4 tablet as needed 30 tablet 6  . Pancrelipase, Lip-Prot-Amyl, (CREON) 24000-76000 units CPEP 1 capsule with meals, 1 capsule with snacks (up to 2 snacks daily) as directed 150 capsule 11   No current facility-administered medications for this visit.     Allergies as of 10/12/2019 - Review Complete 10/12/2019  Allergen Reaction Noted  . Lisinopril Swelling 08/06/2010    Vitals: BP (!) 150/91   Pulse (!) 111   Temp (!) 97.4 F (36.3 C)   Ht  (1.499 m)   Wt 96 lb (43.5 kg)   LMP  (LMP Unknown)   BMI 19.39 kg/m  Last Weight:  Wt Readings from Last 1 Encounters:  10/12/19 96 lb (43.5 kg)   Last Height:   Ht Readings from Last 1 Encounters:  10/12/19  (1.499 m)    Physical exam:  General: The patient is awake, alert and appears not in acute distress. She smells strongly of smoking tobacco.  Head: Normocephalic, atraumatic. Neck is supple. Cardiovascular:  Regular rate and rhythm, without  murmurs or carotid bruit, and without distended neck veins. Respiratory: Lungs  are clear to auscultation. Skin:  Without evidence of edema, or rash Trunk: BMI is 19 ,and patient has normal posture.  Neurologic exam : The patient is awake and alert, oriented to place and time.  Memory subjective  described as intact. There is a normal attention span & concentration ability.  Speech is fluent without  dysarthria, dysphonia or aphasia. Mood and affect are appropriate.  Cranial nerves: Pupils are equal and briskly reactive to light. Funduscopic exam without evidence of pallor or edema. Extraocular movements  in vertical and horizontal planes intact and without nystagmus.(!), slight exophthalmos.   Visual fields by finger perimetry are intact. Hearing to finger rub intact.  Facial sensation intact to fine touch. Facial motor strength is symmetric and tongue and uvula move midline.  Tongue protrusion into either cheek is normal. Shoulder shrug is normal.   Motor exam: Normal tone ,muscle bulk and symmetric strength in all extremities. There was pronator drift on the right side with mild , fine, low amplitude tremor.  Sensory:  Fine touch, pinprick and vibration were reduced in the left extremities. Proprioception was normal.(!)>   Coordination: Rapid alternating movements in the fingers/hands were slowed, more on the right again, tremor became evidient in the ringfinger and pinky.- ulnar neve. Finger-to-nose maneuver  normal without evidence of ataxia, dysmetria or tremor.  Gait and station: Patient walks without assistive device and is able unassisted to climb up to the exam table. Strength within normal limits. Stance is stable and normal. Tandem gait is unfragmented. Romberg testing is positive  - she falls to theleft , propulsive.   Deep tendon reflexes: in the upper and lower extremities are symmetric and intact. Babinski maneuver response is downgoing.   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  History of  alcoholic pancreatitis. She is malnourished.   Plan:  Treatment plan and additional workup :  I agree with vit b complex, sublingual b 12, thiamine and vit D. Pancrease enzyme before each meal, regular meal.  Hydration with water.  Need to stop smoking to slow the progress- not to reverse it.  No ASA due to upper GI bleed.  No further work up needed.  Consider cane to prevent falls- place cane into left hand.  If not able to do with a cane , use walker-  help with fall prevention.  She declined , she is too young for this.  CNS symptoms are result of substance - ETOH abuse.   Nichole Partridge Aki Burdin MD 10/12/2019

## 2019-10-14 DIAGNOSIS — G9340 Encephalopathy, unspecified: Secondary | ICD-10-CM | POA: Insufficient documentation

## 2019-11-18 ENCOUNTER — Encounter: Payer: Self-pay | Admitting: Internal Medicine

## 2019-11-18 ENCOUNTER — Ambulatory Visit: Payer: Medicaid Other | Admitting: Internal Medicine

## 2019-11-18 ENCOUNTER — Other Ambulatory Visit: Payer: Self-pay

## 2019-11-18 VITALS — BP 128/68 | HR 80 | Temp 97.8°F | Resp 12 | Ht 59.0 in | Wt 94.5 lb

## 2019-11-18 DIAGNOSIS — F17200 Nicotine dependence, unspecified, uncomplicated: Secondary | ICD-10-CM | POA: Diagnosis not present

## 2019-11-18 DIAGNOSIS — G312 Degeneration of nervous system due to alcohol: Secondary | ICD-10-CM

## 2019-11-18 DIAGNOSIS — Z716 Tobacco abuse counseling: Secondary | ICD-10-CM

## 2019-11-18 DIAGNOSIS — K860 Alcohol-induced chronic pancreatitis: Secondary | ICD-10-CM

## 2019-11-18 DIAGNOSIS — R634 Abnormal weight loss: Secondary | ICD-10-CM

## 2019-11-18 DIAGNOSIS — F1020 Alcohol dependence, uncomplicated: Secondary | ICD-10-CM

## 2019-11-18 MED ORDER — DEXILANT 60 MG PO CPDR: 60.0000 mg | DELAYED_RELEASE_CAPSULE | Freq: Every day | ORAL | 11 refills | Status: DC

## 2019-11-18 MED ORDER — METOPROLOL TARTRATE 25 MG PO TABS: 12.5000 mg | ORAL_TABLET | Freq: Every day | ORAL | 6 refills | Status: AC | PRN

## 2019-11-18 MED ORDER — VITAMIN B-12 1000 MCG SL SUBL: SUBLINGUAL_TABLET | SUBLINGUAL | 11 refills | Status: AC

## 2019-11-18 MED ORDER — VITAMIN D (ERGOCALCIFEROL) 1.25 MG (50000 UNIT) PO CAPS: 50000.0000 [IU] | ORAL_CAPSULE | ORAL | 11 refills | Status: AC

## 2019-11-18 MED ORDER — THIAMINE HCL 100 MG PO TABS: 100.0000 mg | ORAL_TABLET | Freq: Every day | ORAL | 11 refills | Status: AC

## 2019-11-18 MED ORDER — CREON 24000-76000 UNITS PO CPEP: ORAL_CAPSULE | ORAL | 11 refills | Status: AC

## 2019-11-18 MED ORDER — B COMPLEX VITAMINS PO CAPS: 1.0000 | ORAL_CAPSULE | Freq: Every day | ORAL | 11 refills | Status: AC

## 2019-11-18 NOTE — Progress Notes (Signed)
    Subjective:    Patient ID: Nichole [name], female   DOB: October 08, 1961, 58 y.o.   MRN: [id number]   HPI   1.  Advanced Brain atrophy and white matter disease, pontine damage, cerebellar atrophy. Was seen by Dr. Brett Fairy, Neurology following repeat MR of brain.   Her neurologic symptoms felt secondary to alcohol encephalopathy. She still is not using a cane regularly.   Discussed how to use properly.  2.  Weight issues/Malnourished:  Stopped taking Pancreatic enzymes as she ran out.  Has been out since end of November. States she was taking the enzymes prior to eating previously. Not taking any of the recommended vitamins.  3.  Chronic pancreatitis:  Not having much pain.  She is eating better.    4.  Mold on ceiling:  Had fixed.  States the air conditioning condensation problem was the cause and fixed. She also has noted mice in her apartment. Her landlord has taken care of this as well. She only has problems now in her living room. She does have carpet. Now feels she has sinus congestion and posterior pharyngeal drainage. [address]    No outpatient medications have been marked as taking for the 11/18/19 encounter (Office Visit) with Mack Hook, MD.   Allergies  Allergen Reactions  . Lisinopril Swelling     facial angioedema     Review of Systems    Objective:   BP 128/68 (BP Location: Right Arm, Patient Position: Sitting, Cuff Size: Normal)   Pulse 80   Temp 97.8 F (36.6 C)   Resp 12   Ht 4\' 11"  (1.499 m)   Wt 94 lb 8 oz (42.9 kg)   LMP  (LMP Unknown)   BMI 19.09 kg/m   Physical Exam  NAD--not so cachectic in appearance. Looks healthier HEENT:  PERRL, EOMI Neck:  Supple, No adenopathy Chest:  CTA CV:  RRR without murmur or rub.  Radial and DP pulses normal and equal Abd:  S, NT, No HSM or mass.  + BS LE:  No edema.  Assessment & Plan   1.  Chronic Pancreatitis:  Sent new RX for multiple recommended vitamins.  Refille  pancreatic enzymes as well.  Encouraged her to use all of them.  Appears healthier today   2. Alcoholic Encephalopathy/Cerebellar ataxia:  Went over proper cane use.  3.  Tobacco Abuse:  Encouraged her to consider discontinuation.  Went over options.  She does not seem motivated currently.

## 2019-11-30 ENCOUNTER — Other Ambulatory Visit: Payer: Self-pay

## 2019-11-30 MED ORDER — PANTOPRAZOLE SODIUM 40 MG PO TBEC: DELAYED_RELEASE_TABLET | ORAL | 11 refills | Status: AC

## 2019-12-01 IMAGING — MR MR HEAD W/O CM
11 series · 48 of 48 positions shown · non-contrast
Comparison: MRI head 02/15/2016. MRI report only from [REDACTED]
05/05/2019.

CLINICAL DATA: Left body numbness.

EXAM:
MRI HEAD WITHOUT CONTRAST
TECHNIQUE: Multiplanar, multiecho pulse sequences of the brain and surrounding
structures were obtained without intravenous contrast.

[Series 2: T1 · sagittal · 5.0mm · 0.47mm/px · 3 of 22 slices shown]
[im 1/22]
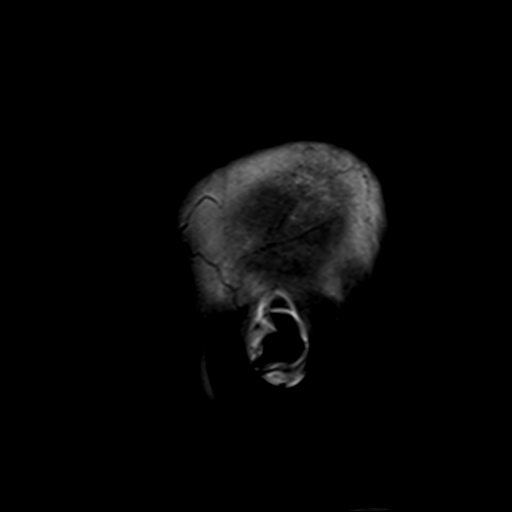
[im 11/22]
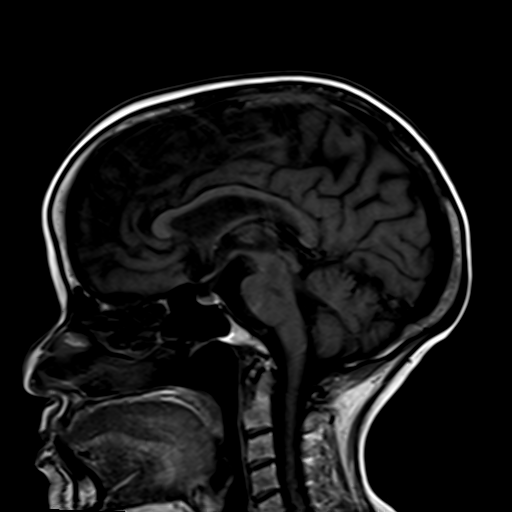
[im 22/22]
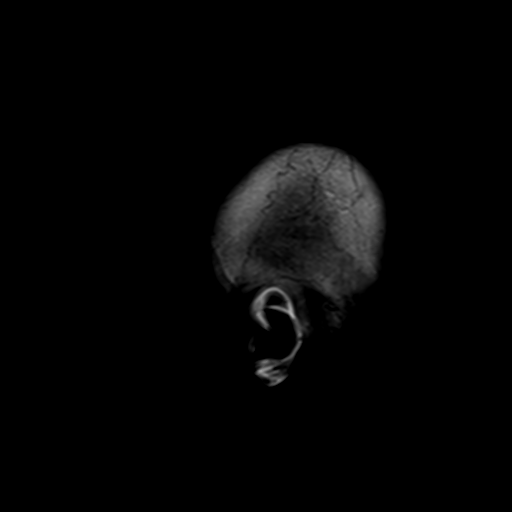

[Series 3: DWI · axial · 3.0mm · 1.88mm/px · z∈[-29,+115]mm · 8 of 98 slices shown (1 of 4)]
[im 1/98]
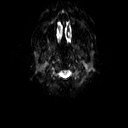
[im 14/98]
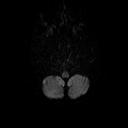
[im 28/98]
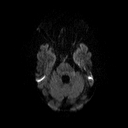
[im 42/98]
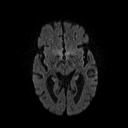
[im 56/98]
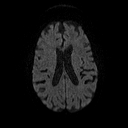
[im 70/98]
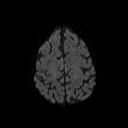
[im 84/98]
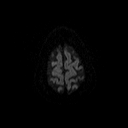
[im 98/98]
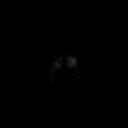

[Series 4: DWI · axial · 3.0mm · 1.88mm/px · z∈[-29,+115]mm · 4 of 49 slices shown (2 of 4)]
[im 1/49]
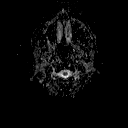
[im 17/49]
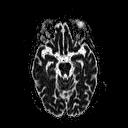
[im 33/49]
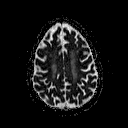
[im 49/49]
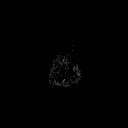

[Series 5: DWI · coronal · 5.0mm · 1.80mm/px · 6 of 75 slices shown (3 of 4)]
[im 1/75]
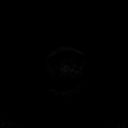
[im 15/75]
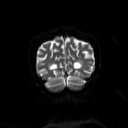
[im 30/75]
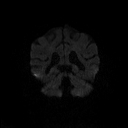
[im 45/75]
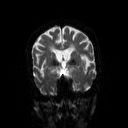
[im 60/75]
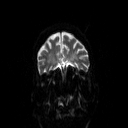
[im 75/75]
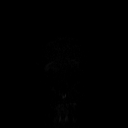

[Series 6: DWI · coronal · 5.0mm · 1.80mm/px · 3 of 38 slices shown (4 of 4)]
[im 1/38]
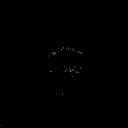
[im 19/38]
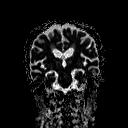
[im 38/38]
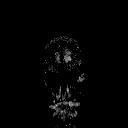

[Series 7: FLAIR · axial · 3.0mm · 0.47mm/px · z∈[-25,+110]mm · 3 of 30 slices shown (1 of 2)]
[im 1/30]
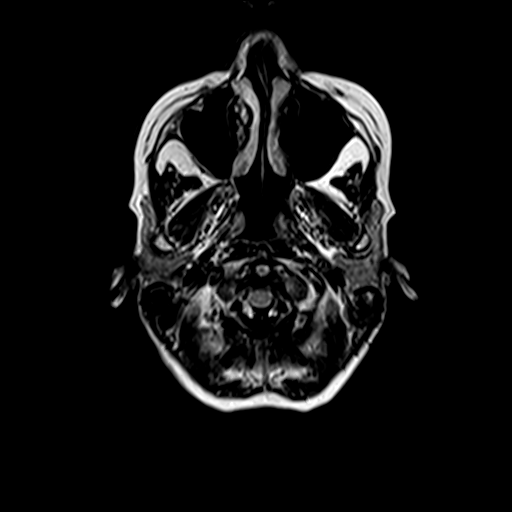
[im 15/30]
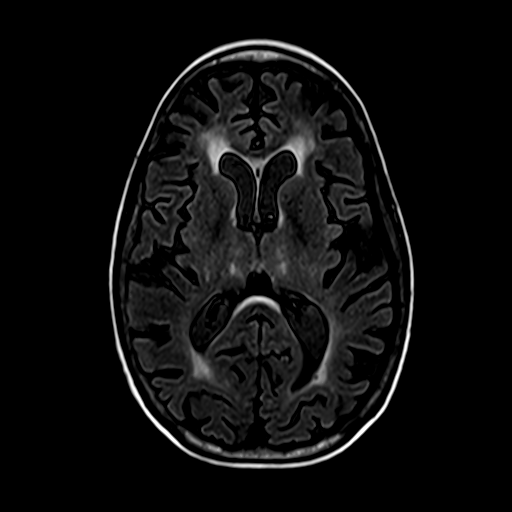
[im 30/30]
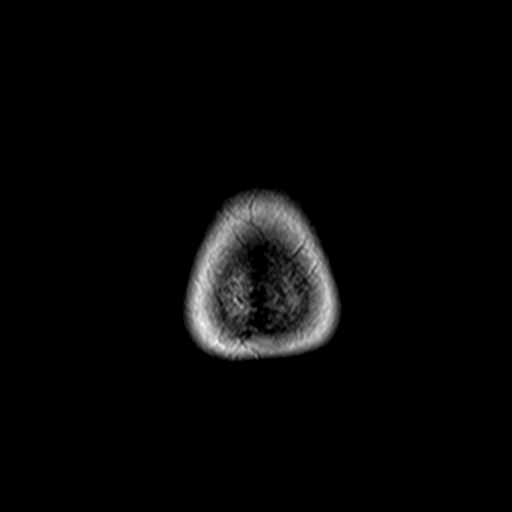

[Series 8: T2 · axial · 5.0mm · 0.62mm/px · z∈[-27,+115]mm · 2 of 22 slices shown (1 of 2)]
[im 1/22]
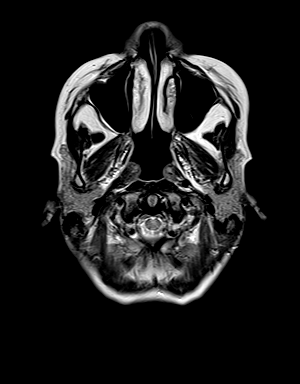
[im 22/22]
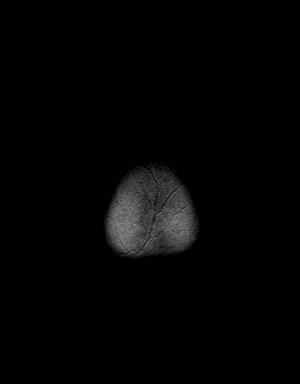

[Series 10: swi_images · axial · 5.0mm · 0.94mm/px · z∈[-30,+115]mm · 3 of 30 slices shown]
[im 1/30]
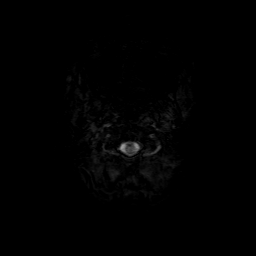
[im 15/30]
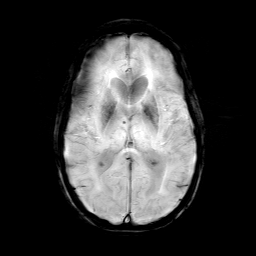
[im 30/30]
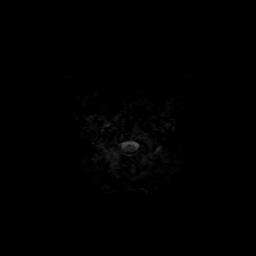

[Series 11: t1_mpr_tra · axial · 1.0mm · 0.75mm/px · z∈[-27,+115]mm · 12 of 144 slices shown]
[im 1/144]
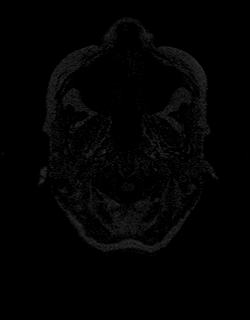
[im 14/144]
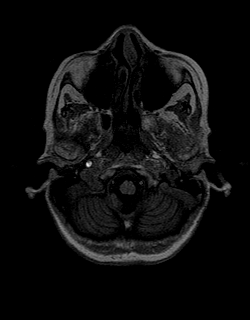
[im 27/144]
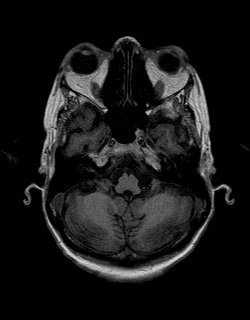
[im 40/144]
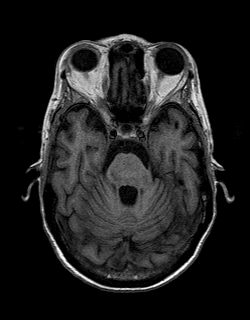
[im 53/144]
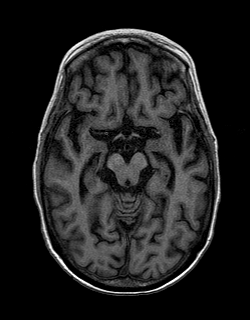
[im 66/144]
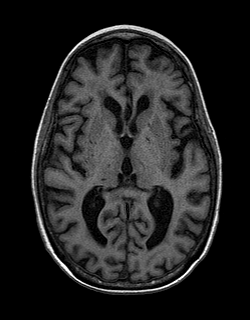
[im 79/144]
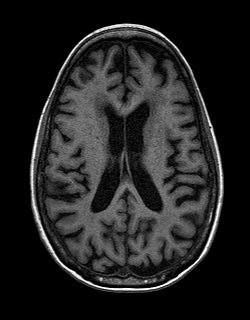
[im 92/144]
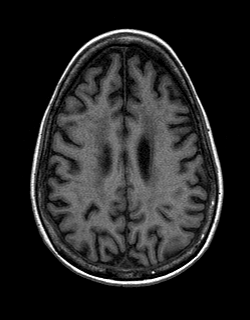
[im 105/144]
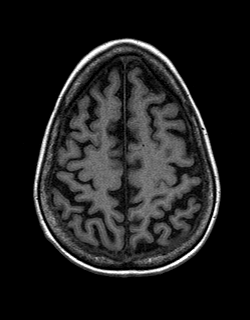
[im 118/144]
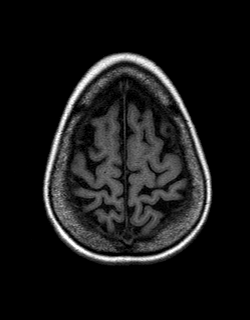
[im 131/144]
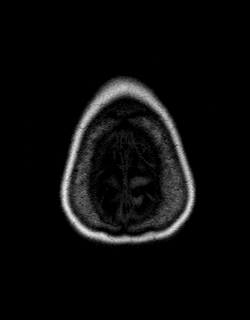
[im 144/144]
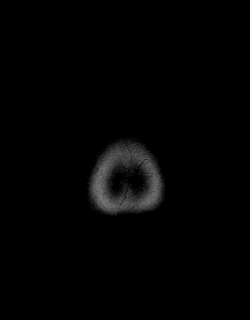

[Series 12: T2 · coronal · 5.0mm · 0.45mm/px · 2 of 29 slices shown (2 of 2)]
[im 1/29]
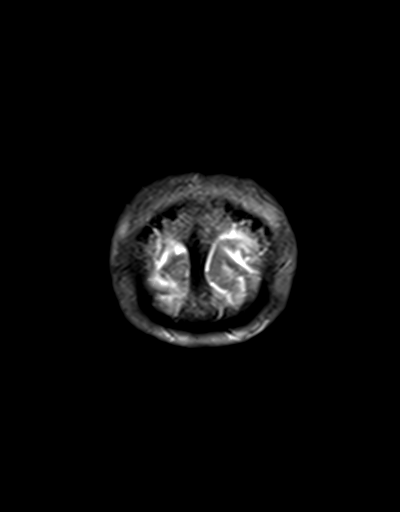
[im 29/29]
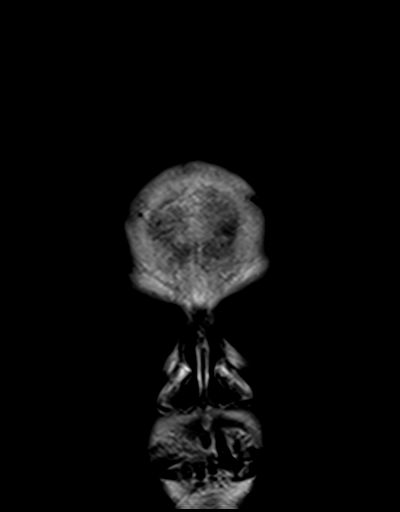

[Series 13: FLAIR · sagittal · 5.0mm · 0.45mm/px · 2 of 26 slices shown (2 of 2)]
[im 1/26]
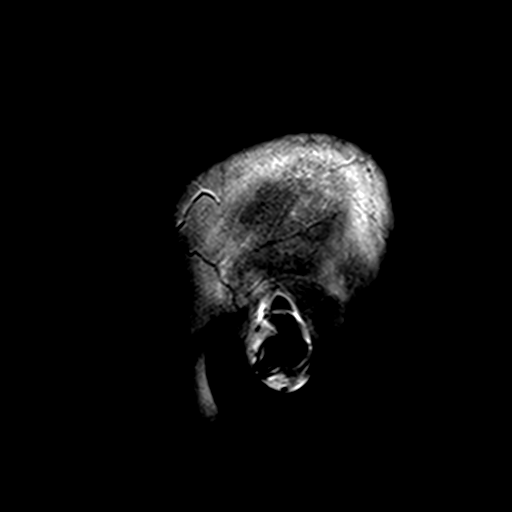
[im 26/26]
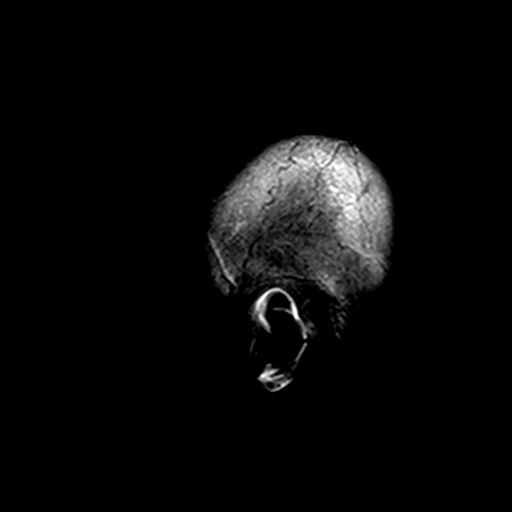

[48 of 48 positions shown; findings below may reference images not displayed]

FINDINGS: Brain: Moderate cerebral atrophy with marked progression since 2669.
Extensive periventricular deep white matter hyperintensity also has
progressed significantly since 2669. Mild patchy hyperintensity in
the thalamus bilaterally. Chronic lacunar infarction in the right
lateral pons. Mild patchy hyperintensity in the brachium pontis
bilaterally.

Negative for acute infarct. No mass lesion. Chronic microhemorrhage
in the cerebellum on the right. Chronic microhemorrhage right
anterior thalamus. No fluid collection or midline shift.

Vascular: Normal arterial flow voids.

Skull and upper cervical spine: No focal bone lesion.

Sinuses/Orbits: Negative

Other: None
IMPRESSION: Negative for acute infarct.

Significant progression of cerebral atrophy since 2669. Significant
progression of white matter disease since 2669 most likely chronic
microvascular ischemia. Chronic infarct right pons. Patchy
hyperintensity in the brachium pontis bilaterally likely chronic
ischemia. Correlate with risk factors for small vessel disease.
Demyelinating disease not considered likely. Correlate with
cognitive decline.

## 2019-12-02 DIAGNOSIS — G9340 Encephalopathy, unspecified: Secondary | ICD-10-CM

## 2019-12-02 HISTORY — DX: Encephalopathy, unspecified: G93.40

## 2020-03-20 ENCOUNTER — Encounter: Payer: Self-pay | Admitting: Internal Medicine

## 2020-03-20 ENCOUNTER — Ambulatory Visit (INDEPENDENT_AMBULATORY_CARE_PROVIDER_SITE_OTHER): Payer: Medicaid Other | Admitting: Internal Medicine

## 2020-03-20 ENCOUNTER — Other Ambulatory Visit: Payer: Self-pay

## 2020-03-20 VITALS — BP 126/80 | HR 82 | Resp 12 | Ht 59.0 in | Wt 95.0 lb

## 2020-03-20 DIAGNOSIS — K047 Periapical abscess without sinus: Secondary | ICD-10-CM

## 2020-03-20 DIAGNOSIS — E042 Nontoxic multinodular goiter: Secondary | ICD-10-CM

## 2020-03-20 DIAGNOSIS — F1020 Alcohol dependence, uncomplicated: Secondary | ICD-10-CM

## 2020-03-20 DIAGNOSIS — G9340 Encephalopathy, unspecified: Secondary | ICD-10-CM

## 2020-03-20 DIAGNOSIS — Z0000 Encounter for general adult medical examination without abnormal findings: Secondary | ICD-10-CM

## 2020-03-20 DIAGNOSIS — Z1231 Encounter for screening mammogram for malignant neoplasm of breast: Secondary | ICD-10-CM

## 2020-03-20 DIAGNOSIS — Z780 Asymptomatic menopausal state: Secondary | ICD-10-CM | POA: Diagnosis not present

## 2020-03-20 DIAGNOSIS — Z1211 Encounter for screening for malignant neoplasm of colon: Secondary | ICD-10-CM

## 2020-03-20 DIAGNOSIS — F17200 Nicotine dependence, unspecified, uncomplicated: Secondary | ICD-10-CM

## 2020-03-20 MED ORDER — PENICILLIN V POTASSIUM 250 MG PO TABS: ORAL_TABLET | ORAL | 0 refills | Status: AC

## 2020-03-20 NOTE — Patient Instructions (Addendum)
Warm bath and reading before bed. If unable to get to sleep in 20 minutes, get out of bed, go somewhere calm and read until you are sleepy, then back to bed. If awaken and unable to get back to sleep in 20 minutes--read as above until sleepy. Physical activity with a friend every day--gradually work up to 1 hour.  Do not exercise close to bedtime. No napping  CALL YOUR DENTIST and get tooth taken care of--NO ALCOHOL  Use Tylenol 500 mg every 6 hours as needed for tooth pain.  You will be getting phone calls for ultrasound of thyroid, and from Breast center for bone density and mammogram and from GI for colonoscopy. Do not screen phone calls.

## 2020-03-20 NOTE — Progress Notes (Signed)
Subjective:    Patient ID: Nichole [name], female   DOB: Dec 28, 1960, 59 y.o.   MRN: [id number]   HPI   CPE without pap  1.  Pap:  Last pap was normal on 01/12/2019.    2.  Mammogram:  Did not go for mammogram as ordered 03/14/2019.  Last done was in 2006.  No family history of breast cancer.   3.  Osteoprevention:  Drinking Ensure 4 times weekly.  Chocolate milk twice weekly.  Eats cheese once to twice daily.  She is willing to increase milk or Ensure.  No physical activity on regular basis.   Still smoking.   4.  Guaiac Cards:  History of fecal occult blood in 2017.  Refused to return Guaiac cards since.  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  2018 through 2020.  Complications with infection during time period.  Last stent removed in May 2020.  5.  Colonoscopy:  Never performed.  Patient resistant.  No family history of colon cancer.  6.  Immunizations:   Immunization History  Administered Date(s) Administered  . Moderna SARS-COVID-2 Vaccination 02/27/2020  . Td 03/19/2009     7.  Glucose/Cholesterol:  History of elevated glucose with illness in past, but A1Cs always normal.   Cholesterol panel in past fine, though more recently with low HDL.    Lipid Panel     Component Value Date/Time   CHOL 143 02/28/2016 1409   TRIG 141 02/28/2016 1409   HDL 32 (L) 02/28/2016 1409   CHOLHDL 4.5 02/28/2016 1409   VLDL 28 02/28/2016 1409   LDLCALC 83 02/28/2016 1409     Current Meds  Medication Sig  . metoprolol tartrate (LOPRESSOR) 25 MG tablet Take 0.5 tablets (12.5 mg total) by mouth daily as needed (rapid heart rate/ high blood pressure).  . Pancrelipase, Lip-Prot-Amyl, (CREON) 24000-76000 units CPEP 1 capsule with meals, 1 capsule with snacks (up to 2 snacks daily) as directed  . thiamine 100 MG tablet Take 1 tablet (100 mg total) by mouth daily.  . Vitamin D, Ergocalciferol,  (DRISDOL) 1.25 MG (50000 UT) CAPS capsule Take 1 capsule (50,000 Units total) by mouth every 7 (seven) days.   Allergies  Allergen Reactions  . Lisinopril Swelling     facial angioedema   Past Medical History:  Diagnosis Date  . Acute pancreatitis 12/05/2010, 01/2016   with complex pseudocyst, attributed to ETOH.  2017: pseudocyst, acute on chronic pancreatitis. pancreatic calcifications.  . ANEMIA, B12 DEFICIENCY 04/05/2009   macrocytic  . Encephalopathy 2021   See MRI:  Alcohol induced:  Advanced brain atrophy and white matter disease, pontine damage, and cerebellar atrophy.   . EXCESSIVE MENSTRUAL BLEEDING 03/18/2010   uterine fibroids. gyn eval and bx neg  . Hematemesis 01/2016   in setting pancreatitispseudocyst.  . HYPERTENSION 01/29/2008  . LEG PAIN, BILATERAL 06/22/2008  . Palpitations 01/02/2009   States heart rate has been up to 180, though states has never been seen when having fast heart rate.  Marland Kitchen PERIMENOPAUSAL SYNDROME 08/23/2009  . RASH 12/23/2010  . 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 SYNCOPE 01/02/2009  . THYROID FUNCTION TEST, ABNORMAL 03/19/2009  . TOBACCO USE 01/25/2008  . WEIGHT LOSS-ABNORMAL 12/05/2010      Past Surgical History:  Procedure Laterality Date  . ESOPHAGOGASTRODUODENOSCOPY (EGD) WITH PROPOFOL N/A 02/28/2016   Procedure: ESOPHAGOGASTRODUODENOSCOPY (EGD) WITH PROPOFOL;  Surgeon: Napoleon Form, MD;  Location: [facility];  Service: Endoscopy;  Laterality: N/A;  . KNEE SURGERY Left 1998   Arthroscopic--for torn meniscus    Family History  Problem Relation Age of Onset  . Hypertension Mother   . Kidney disease Mother        removed for cyst--not clear if this was a malignancy or not  . Other Mother        overweight--lost a lot following diagnosis of diabetes  . Diabetes Mother   . Heart attack Father   . Stroke Father        died age 57 yo--had multiple TIAs  . Muscular dystrophy Father   . Hypertension  Brother   . Neuromuscular disorder Other        spinomuscular atrophy    Social History   Socioeconomic History  . Marital status: Single    Spouse name: Not on file  . Number of children: 0  . Years of education: GED  . Highest education level: Not on file  Occupational History  . Occupation: Engineer, manufacturing: [employer]  Tobacco Use  . Smoking status: Current Every Day Smoker    Packs/day: 0.50    Years: 36.00    Pack years: 18.00    Types: Cigarettes  . Smokeless tobacco: Never Used  . Tobacco comment: Not ready to quit.  Does not want patches or other medication support.  Not a "medication"  person.    Substance and Sexual Activity  . Alcohol use: Not on file    Comment: Initially stated no alcohol, but admitted later to using alcohol for pain control with dental pain (03/2020)  . Drug use: No  . Sexual activity: Not Currently  Other Topics Concern  . Not on file  Social History Narrative   Has lived in [place] for 15 years, in [place] for 30 plus years.   Originally from [place] Strain:   . Difficulty of Paying Living Expenses:   Food Insecurity:   . Worried About Programme researcher, broadcasting/film/video in the Last Year:   . Barista in the Last Year:   Transportation Needs:   . Freight forwarder (Medical):   Marland Kitchen Lack of Transportation (Non-Medical):   Physical Activity:   . Days of Exercise per Week:   . Minutes of Exercise per Session:   Stress:   . Feeling of Stress :   Social Connections:   . Frequency of Communication with Friends and Family:   . Frequency of Social Gatherings with Friends and Family:   . Attends Religious Services:   . Active Member of Clubs or Organizations:   . Attends Banker Meetings:   Marland Kitchen Marital Status:   Intimate Partner Violence:   . Fear of Current or Ex-Partner:   . Emotionally Abused:   Marland Kitchen Physically Abused:   . Sexually  Abused:       Review of Systems  Constitutional: Negative for appetite change, fatigue, fever and unexpected weight change.  HENT: Positive for dental problem (Has a bad tooth.  Needs a dentist that takes Medicaid.  Currently has a dentist she wants to finish with.), postnasal drip and sinus pressure (only in her living room.  Landlords have remediated one room, but she continues to have symptoms in her living room only.  No roaches.). Negative for hearing  loss, rhinorrhea and sore throat.   Eyes: Negative for visual disturbance.  Respiratory: Positive for cough (productive of clear mucous.  Feels secondary to where she stays.  ). Negative for shortness of breath.   Cardiovascular: Negative for chest pain, palpitations (Not often.  Takes Metoprolol, very small amount to stop.  Refuses to take regularly.) and leg swelling.  Gastrointestinal: Positive for abdominal pain (Occasional left sided pain, but her pancreatic pain issues are decreased.). Negative for blood in stool (No melena), constipation and diarrhea (No floating stools.  Sometimes appear greasy.).  Genitourinary: Negative for dysuria, frequency, vaginal bleeding and vaginal discharge.  Musculoskeletal:       Left shoulder pain--hard to abduct above 90 degrees.   Also, chronic left knee pain leading to leg pain at times.  Skin: Negative for rash.  Neurological:       Left sided weakness without change--evaluated by Neurology and felt to be due to alcohol abuse damage to brain.    Psychiatric/Behavioral: Positive for dysphoric mood (Has a counselor:  Vangie Bicker, [place].  Not interested in medication. ) and sleep disturbance (Cannot initiate sleep--problem for years.  Poor sleep hygiene. ). The patient is nervous/anxious.       Objective:   BP 126/80 (BP Location: Left Arm, Patient Position: Sitting, Cuff Size: Normal)   Pulse 82   Resp 12   Ht 4\' 11"  (1.499 m)   Wt 95 lb (43.1 kg)   LMP  (LMP  Unknown)   BMI 19.19 kg/m   Physical Exam  Constitutional: She is oriented to person, place, and time. She appears cachectic.  HENT:  Head: Normocephalic and atraumatic.  Right Ear: Tympanic membrane, external ear and ear canal normal.  Left Ear: Tympanic membrane, external ear and ear canal normal.  Nose: Mucosal edema and rhinorrhea (Clear) present.  Mouth/Throat: Uvula is midline, oropharynx is clear and moist and mucous membranes are normal. Abnormal dentition (Poor hygiene with thick plaqueing at bases of teeth.  Many teeth loose with significant gingival recesssion..  Right posterior lower molar very loose and tender to palpatiion.  No surrounding gingival fluctuance). Dental caries present.  Eyes: Pupils are equal, round, and reactive to light. Conjunctivae and EOM are normal.  Neck: Thyromegaly (With nodularity) present.  Cardiovascular: Normal rate, regular rhythm, S1 normal and S2 normal. Exam reveals no S3, no S4 and no friction rub.  No murmur heard. No carotid bruits.  Carotid, radial, femoral, DP and PT pulses normal and equal.   Pulmonary/Chest: Effort normal and breath sounds normal. Right breast exhibits no inverted nipple, no mass, no nipple discharge, no skin change and no tenderness. Left breast exhibits no inverted nipple, no mass, no nipple discharge, no skin change and no tenderness.  Abdominal: Soft. Bowel sounds are normal. She exhibits no mass. There is no hepatosplenomegaly. There is no abdominal tenderness. No hernia.  Genitourinary:    Genitourinary Comments: Normal external female genitalia.Limited pelvic exam as patient had difficulty complying with exam.   No uterine or adnexal mass or tenderness with suboptimal exam   Musculoskeletal:        General: Normal range of motion.     Cervical back: Full passive range of motion without pain, normal range of motion and neck supple.  Lymphadenopathy:       Head (right side): No submental and no submandibular  adenopathy present.       Head (left side): No submental and no submandibular adenopathy present.  She has no cervical adenopathy.    She has no axillary adenopathy.       Right: No inguinal and no supraclavicular adenopathy present.       Left: No inguinal and no supraclavicular adenopathy present.  Neurological: She is alert and oriented to person, place, and time. She has normal strength and normal reflexes. A sensory deficit (Decreased sensation to light touch over entire left side.  ) is present. No cranial nerve deficit. Coordination and gait normal.  Skin: Skin is warm. No rash noted.  Psychiatric: She has a normal mood and affect.    Assessment & Plan  1.  CPE without pap Mammogram, DEXA, screening colonoscopy Fasting labs in 1 week:  FLP, CBC, CMP, Vitamin D, TSH  2.  Allergies:  May be related to housing issues.  She will write a letter, dated, to her landlord regarding remediation.  [employer].  If no movement on this , she will call for referral to [facility] for Healthy Homes inspection.    3.  Dental pain and infection:  Pen VK 250 mg 4 times daily for 7 days. Drinking again in past few days--gin and will not quantify for dental pain. Tylenol helps 500 mg once daily. Discussed making better choices--to call her dentist ASAP and make appt.  Talk with her counselor--see ROS Despite this, looks well today.  4.  Alcohol Abuse and recurrent acute on chronic pancreatitis with poor nutrition:  As in #3  5.  Alcohol induced Encephalopathy:  Evaluated by Dr. Vickey Huger, Neurology in Nov 2020.  6. Nodular thyromegaly:  TSH in 1 week and thyroid ultrasound.  7.  Tobacco abuse:  No interest in discontinuing.

## 2020-03-27 ENCOUNTER — Other Ambulatory Visit: Payer: Medicaid Other

## 2020-04-04 ENCOUNTER — Other Ambulatory Visit: Payer: Medicaid Other

## 2020-04-12 ENCOUNTER — Encounter: Payer: Self-pay | Admitting: Internal Medicine

## 2020-05-01 DEATH — deceased

## 2020-07-24 ENCOUNTER — Ambulatory Visit: Payer: Medicaid Other | Admitting: Internal Medicine
